# Patient Record
Sex: Female | Born: 1950 | Race: White | Hispanic: No | Marital: Single | Smoking: Never smoker
Health system: Southern US, Community
[De-identification: ages and names within clinical notes are randomized; demographics above are authoritative.]

## PROBLEM LIST (undated history)

## (undated) DIAGNOSIS — I499 Cardiac arrhythmia, unspecified: Secondary | ICD-10-CM

## (undated) DIAGNOSIS — R413 Other amnesia: Secondary | ICD-10-CM

## (undated) DIAGNOSIS — M41129 Adolescent idiopathic scoliosis, site unspecified: Secondary | ICD-10-CM

## (undated) DIAGNOSIS — K635 Polyp of colon: Secondary | ICD-10-CM

## (undated) DIAGNOSIS — I10 Essential (primary) hypertension: Secondary | ICD-10-CM

## (undated) DIAGNOSIS — I4891 Unspecified atrial fibrillation: Secondary | ICD-10-CM

## (undated) DIAGNOSIS — M778 Other enthesopathies, not elsewhere classified: Secondary | ICD-10-CM

## (undated) DIAGNOSIS — F419 Anxiety disorder, unspecified: Secondary | ICD-10-CM

## (undated) DIAGNOSIS — Z8619 Personal history of other infectious and parasitic diseases: Secondary | ICD-10-CM

## (undated) DIAGNOSIS — E785 Hyperlipidemia, unspecified: Secondary | ICD-10-CM

## (undated) DIAGNOSIS — M7580 Other shoulder lesions, unspecified shoulder: Secondary | ICD-10-CM

## (undated) DIAGNOSIS — I639 Cerebral infarction, unspecified: Secondary | ICD-10-CM

## (undated) HISTORY — PX: TONSILLECTOMY: SUR1361

## (undated) HISTORY — PX: BACK SURGERY: SHX140

---

## 2006-01-19 ENCOUNTER — Ambulatory Visit: Payer: Self-pay | Admitting: Internal Medicine

## 2009-12-13 ENCOUNTER — Inpatient Hospital Stay: Payer: Self-pay | Admitting: Internal Medicine

## 2009-12-29 ENCOUNTER — Encounter: Payer: Self-pay | Admitting: Internal Medicine

## 2010-01-12 ENCOUNTER — Encounter: Payer: Self-pay | Admitting: Internal Medicine

## 2010-02-12 ENCOUNTER — Encounter: Payer: Self-pay | Admitting: Internal Medicine

## 2010-03-14 ENCOUNTER — Encounter: Payer: Self-pay | Admitting: Internal Medicine

## 2010-04-14 ENCOUNTER — Encounter: Payer: Self-pay | Admitting: Internal Medicine

## 2010-04-24 ENCOUNTER — Emergency Department: Payer: Self-pay | Admitting: Unknown Physician Specialty

## 2010-05-14 DIAGNOSIS — I639 Cerebral infarction, unspecified: Secondary | ICD-10-CM

## 2010-05-14 HISTORY — DX: Cerebral infarction, unspecified: I63.9

## 2010-08-31 ENCOUNTER — Ambulatory Visit: Payer: Self-pay

## 2010-09-22 ENCOUNTER — Ambulatory Visit: Payer: Self-pay | Admitting: Neurology

## 2011-11-23 ENCOUNTER — Ambulatory Visit: Payer: Self-pay | Admitting: Internal Medicine

## 2012-11-26 ENCOUNTER — Ambulatory Visit: Payer: Self-pay | Admitting: Internal Medicine

## 2013-05-08 ENCOUNTER — Ambulatory Visit: Payer: Self-pay | Admitting: Gastroenterology

## 2013-05-10 LAB — PATHOLOGY REPORT

## 2013-11-27 ENCOUNTER — Ambulatory Visit: Payer: Self-pay | Admitting: Internal Medicine

## 2014-11-28 ENCOUNTER — Ambulatory Visit: Payer: Self-pay | Admitting: Internal Medicine

## 2014-12-03 ENCOUNTER — Ambulatory Visit: Payer: Self-pay | Admitting: Internal Medicine

## 2015-02-27 ENCOUNTER — Other Ambulatory Visit: Payer: Self-pay | Admitting: Internal Medicine

## 2015-02-27 DIAGNOSIS — N63 Unspecified lump in unspecified breast: Secondary | ICD-10-CM

## 2015-06-04 ENCOUNTER — Ambulatory Visit
Admission: RE | Admit: 2015-06-04 | Discharge: 2015-06-04 | Disposition: A | Discharge status: Home / Self Care | Payer: Medicare Other | Source: Ambulatory Visit | Attending: Internal Medicine | Admitting: Internal Medicine

## 2015-06-04 DIAGNOSIS — N63 Unspecified lump in unspecified breast: Secondary | ICD-10-CM

## 2015-10-28 ENCOUNTER — Other Ambulatory Visit: Payer: Self-pay | Admitting: Internal Medicine

## 2015-10-28 DIAGNOSIS — N63 Unspecified lump in unspecified breast: Secondary | ICD-10-CM

## 2015-12-10 ENCOUNTER — Other Ambulatory Visit: Payer: Self-pay | Admitting: Internal Medicine

## 2015-12-10 ENCOUNTER — Ambulatory Visit
Admission: RE | Admit: 2015-12-10 | Discharge: 2015-12-10 | Disposition: A | Discharge status: Home / Self Care | Payer: Medicare Other | Source: Ambulatory Visit | Attending: Internal Medicine | Admitting: Internal Medicine

## 2015-12-10 DIAGNOSIS — N63 Unspecified lump in unspecified breast: Secondary | ICD-10-CM

## 2015-12-11 ENCOUNTER — Other Ambulatory Visit: Payer: Self-pay | Admitting: Internal Medicine

## 2015-12-11 DIAGNOSIS — N63 Unspecified lump in unspecified breast: Secondary | ICD-10-CM

## 2015-12-22 ENCOUNTER — Ambulatory Visit
Admission: RE | Admit: 2015-12-22 | Discharge: 2015-12-22 | Disposition: A | Discharge status: Home / Self Care | Payer: Medicare Other | Source: Ambulatory Visit | Attending: Internal Medicine | Admitting: Internal Medicine

## 2015-12-22 ENCOUNTER — Other Ambulatory Visit: Payer: Self-pay | Admitting: Internal Medicine

## 2015-12-22 DIAGNOSIS — N63 Unspecified lump in unspecified breast: Secondary | ICD-10-CM

## 2016-09-02 ENCOUNTER — Encounter: Payer: Self-pay | Admitting: *Deleted

## 2016-09-05 ENCOUNTER — Encounter
Admission: RE | Disposition: A | Discharge status: Home / Self Care | Payer: Self-pay | Source: Ambulatory Visit | Attending: Gastroenterology

## 2016-09-05 ENCOUNTER — Ambulatory Visit: Payer: Medicare Other | Admitting: Anesthesiology

## 2016-09-05 ENCOUNTER — Encounter: Payer: Self-pay | Admitting: *Deleted

## 2016-09-05 ENCOUNTER — Ambulatory Visit
Admission: RE | Admit: 2016-09-05 | Discharge: 2016-09-05 | Disposition: A | Discharge status: Home / Self Care | Payer: Medicare Other | Source: Ambulatory Visit | Attending: Gastroenterology | Admitting: Gastroenterology

## 2016-09-05 DIAGNOSIS — I10 Essential (primary) hypertension: Secondary | ICD-10-CM | POA: Insufficient documentation

## 2016-09-05 DIAGNOSIS — Z800 Family history of malignant neoplasm of digestive organs: Secondary | ICD-10-CM | POA: Diagnosis not present

## 2016-09-05 DIAGNOSIS — K621 Rectal polyp: Secondary | ICD-10-CM | POA: Diagnosis not present

## 2016-09-05 DIAGNOSIS — K5730 Diverticulosis of large intestine without perforation or abscess without bleeding: Secondary | ICD-10-CM | POA: Diagnosis not present

## 2016-09-05 DIAGNOSIS — I4891 Unspecified atrial fibrillation: Secondary | ICD-10-CM | POA: Insufficient documentation

## 2016-09-05 DIAGNOSIS — Z8673 Personal history of transient ischemic attack (TIA), and cerebral infarction without residual deficits: Secondary | ICD-10-CM | POA: Insufficient documentation

## 2016-09-05 DIAGNOSIS — Z86010 Personal history of colonic polyps: Secondary | ICD-10-CM | POA: Diagnosis present

## 2016-09-05 DIAGNOSIS — E785 Hyperlipidemia, unspecified: Secondary | ICD-10-CM | POA: Diagnosis not present

## 2016-09-05 DIAGNOSIS — K635 Polyp of colon: Secondary | ICD-10-CM | POA: Insufficient documentation

## 2016-09-05 DIAGNOSIS — Z79899 Other long term (current) drug therapy: Secondary | ICD-10-CM | POA: Diagnosis not present

## 2016-09-05 DIAGNOSIS — Z1211 Encounter for screening for malignant neoplasm of colon: Secondary | ICD-10-CM | POA: Insufficient documentation

## 2016-09-05 DIAGNOSIS — K644 Residual hemorrhoidal skin tags: Secondary | ICD-10-CM | POA: Diagnosis not present

## 2016-09-05 DIAGNOSIS — D125 Benign neoplasm of sigmoid colon: Secondary | ICD-10-CM | POA: Insufficient documentation

## 2016-09-05 DIAGNOSIS — Z91040 Latex allergy status: Secondary | ICD-10-CM | POA: Insufficient documentation

## 2016-09-05 DIAGNOSIS — D122 Benign neoplasm of ascending colon: Secondary | ICD-10-CM | POA: Diagnosis not present

## 2016-09-05 DIAGNOSIS — Z7901 Long term (current) use of anticoagulants: Secondary | ICD-10-CM | POA: Insufficient documentation

## 2016-09-05 DIAGNOSIS — Z880 Allergy status to penicillin: Secondary | ICD-10-CM | POA: Insufficient documentation

## 2016-09-05 HISTORY — DX: Polyp of colon: K63.5

## 2016-09-05 HISTORY — DX: Unspecified atrial fibrillation: I48.91

## 2016-09-05 HISTORY — PX: COLONOSCOPY: SHX5424

## 2016-09-05 HISTORY — DX: Hyperlipidemia, unspecified: E78.5

## 2016-09-05 HISTORY — DX: Cardiac arrhythmia, unspecified: I49.9

## 2016-09-05 HISTORY — DX: Cerebral infarction, unspecified: I63.9

## 2016-09-05 HISTORY — DX: Adolescent idiopathic scoliosis, site unspecified: M41.129

## 2016-09-05 HISTORY — DX: Essential (primary) hypertension: I10

## 2016-09-05 LAB — PROTIME-INR
INR: 0.89
Prothrombin Time: 12 seconds (ref 11.4–15.2)

## 2016-09-05 SURGERY — COLONOSCOPY
Anesthesia: General

## 2016-09-05 MED ORDER — PROPOFOL 10 MG/ML IV BOLUS
INTRAVENOUS | Status: DC | PRN
  Administered 2016-09-05: 50 mg via INTRAVENOUS

## 2016-09-05 MED ORDER — SODIUM CHLORIDE 0.9 % IV SOLN
INTRAVENOUS | Status: DC
  Administered 2016-09-05: 13:00:00 via INTRAVENOUS
  Administered 2016-09-05: 1000 mL via INTRAVENOUS

## 2016-09-05 MED ORDER — SODIUM CHLORIDE 0.9 % IV SOLN: INTRAVENOUS | Status: DC

## 2016-09-05 MED ORDER — PROPOFOL 500 MG/50ML IV EMUL
INTRAVENOUS | Status: DC | PRN
  Administered 2016-09-05: 140 ug/kg/min via INTRAVENOUS

## 2016-09-05 MED ORDER — LIDOCAINE HCL (CARDIAC) 20 MG/ML IV SOLN
INTRAVENOUS | Status: DC | PRN
  Administered 2016-09-05: 60 mg via INTRAVENOUS

## 2016-09-05 NOTE — H&P (Signed)
Outpatient short stay form Pre-procedure 09/05/2016 12:53 PM Lollie Sails MD  Primary Physician: Dr. Emily Filbert  Reason for visit:  Colonoscopy  History of present illness:  Patient is a 65 year old female presenting today as above. She has personal history of colon polyps. Her last colonoscopy was 05/08/2013. There is a moderate size polyp removed from the rectosigmoid colon. There were several other polyps removed otherwise. These were all consistent with tubular adenomas. She tolerated her prep well. She takes no other blood thinning agent other than Coumadin. She has been off that for 5 days. ProTime is being checked.    Current Facility-Administered Medications:  .  0.9 %  sodium chloride infusion, , Intravenous, Continuous, Lollie Sails, MD .  0.9 %  sodium chloride infusion, , Intravenous, Continuous, Lollie Sails, MD  Prescriptions Prior to Admission  Medication Sig Dispense Refill Last Dose  . amLODipine-olmesartan (AZOR) 5-40 MG tablet Take 1 tablet by mouth daily.   09/04/2016 at Unknown time  . Calcium Carbonate-Vitamin D (CALCIUM-CARB 600 + D PO) Take 1 tablet by mouth daily.   09/04/2016 at Unknown time  . Potassium 99 MG TABS Take 1 tablet by mouth daily.   09/04/2016 at Unknown time  . simvastatin (ZOCOR) 10 MG tablet Take 10 mg by mouth daily.   09/04/2016 at Unknown time  . warfarin (COUMADIN) 2.5 MG tablet Take 2.5 mg by mouth daily. Friday,, Tuesday, Thursday, Saturday, and Sunday   Past Week at Unknown time  . warfarin (COUMADIN) 5 MG tablet Take 5 mg by mouth 2 (two) times a week. On Monday and Wednesday   Past Week at Unknown time     Allergies  Allergen Reactions  . Penicillins   . Latex Rash     Past Medical History:  Diagnosis Date  . Adolescent scoliosis   . Atrial fibrillation ([place])   . Dysrhythmia   . Hyperlipidemia   . Hypertension   . Stroke [facility])     Review of systems:      Physical Exam    Heart and lungs: Regular rate  and rhythm    HEENT: Normocephalic atraumatic eyes are anicteric     Other:     Pertinant exam for procedure: Soft nontender nondistended bowel sounds positive normoactive.    Planned proceedures: Colonoscopy and indicated procedures. I have discussed the risks benefits and complications of procedures to include not limited to bleeding, infection, perforation and the risk of sedation and the patient wishes to proceed.    Lollie Sails, MD Gastroenterology 09/05/2016  12:53 PM

## 2016-09-05 NOTE — Transfer of Care (Signed)
Immediate Anesthesia Transfer of Care Note  Patient: Shannon Donovan  Procedure(s) Performed: Procedure(s) with comments: COLONOSCOPY (N/A) - Pt on Coumadin (PT/ INR)  Patient Location: Endoscopy Unit  Anesthesia Type:General  Level of Consciousness: awake, alert , oriented and patient cooperative  Airway & Oxygen Therapy: Patient Spontanous Breathing and Patient connected to nasal cannula oxygen  Post-op Assessment: Report given to RN, Post -op Vital signs reviewed and stable and Patient moving all extremities X 4  Post vital signs: Reviewed and stable  Last Vitals:  Vitals:   09/05/16 1221 09/05/16 1404  BP: (!) 146/66   Pulse: (!) 106   Resp: 18   Temp: 36.9 C (!) (P) 35.8 C    Last Pain:  Vitals:   09/05/16 1404  TempSrc: (P) Axillary         Complications: No apparent anesthesia complications

## 2016-09-05 NOTE — Anesthesia Preprocedure Evaluation (Signed)
Anesthesia Evaluation  Patient identified by MRN, date of birth, ID band Patient awake    Reviewed: Allergy & Precautions, NPO status , Patient's Chart, lab work & pertinent test results  History of Anesthesia Complications Negative for: history of anesthetic complications  Airway Mallampati: II  TM Distance: >3 FB Neck ROM: Full    Dental no notable dental hx.    Pulmonary neg pulmonary ROS, neg sleep apnea, neg COPD,    breath sounds clear to auscultation- rhonchi (-) wheezing      Cardiovascular Exercise Tolerance: Good hypertension, Pt. on medications (-) CAD and (-) Past MI  Rhythm:Regular Rate:Normal - Systolic murmurs and - Diastolic murmurs    Neuro/Psych CVA, No Residual Symptoms negative psych ROS   GI/Hepatic negative GI ROS, Neg liver ROS,   Endo/Other  negative endocrine ROSneg diabetes  Renal/GU negative Renal ROS     Musculoskeletal negative musculoskeletal ROS (+)   Abdominal (+) - obese,   Peds  Hematology negative hematology ROS (+)   Anesthesia Other Findings Past Medical History: No date: Adolescent scoliosis No date: Atrial fibrillation (HCC) No date: Dysrhythmia No date: Hyperlipidemia No date: Hypertension No date: Stroke [facility])   Reproductive/Obstetrics                             Anesthesia Physical Anesthesia Plan  ASA: II  Anesthesia Plan: General   Post-op Pain Management:    Induction: Intravenous  Airway Management Planned: Natural Airway  Additional Equipment:   Intra-op Plan:   Post-operative Plan:   Informed Consent: I have reviewed the patients History and Physical, chart, labs and discussed the procedure including the risks, benefits and alternatives for the proposed anesthesia with the patient or authorized representative who has indicated his/her understanding and acceptance.   Dental advisory given  Plan Discussed with: CRNA and  Anesthesiologist  Anesthesia Plan Comments:         Anesthesia Quick Evaluation

## 2016-09-05 NOTE — Anesthesia Postprocedure Evaluation (Signed)
Anesthesia Post Note  Patient: Shannon Donovan  Procedure(s) Performed: Procedure(s) (LRB): COLONOSCOPY (N/A)  Patient location during evaluation: Endoscopy Anesthesia Type: General Level of consciousness: awake and alert and oriented Pain management: pain level controlled Vital Signs Assessment: post-procedure vital signs reviewed and stable Respiratory status: spontaneous breathing, nonlabored ventilation and respiratory function stable Cardiovascular status: blood pressure returned to baseline and stable Postop Assessment: no signs of nausea or vomiting Anesthetic complications: no    Last Vitals:  Vitals:   09/05/16 1425 09/05/16 1434  BP: 106/85 137/62  Pulse: 73 66  Resp: (!) 26 18  Temp:      Last Pain:  Vitals:   09/05/16 1404  TempSrc: Axillary                 Shannon Donovan

## 2016-09-05 NOTE — Op Note (Addendum)
[facility] Gastroenterology Patient Name: Shannon Donovan Procedure Date: 09/05/2016 1:12 PM MRN: [id number] Account #: [id number] Date of Birth: 09/29/1951 Admit Type: Outpatient Age: 65 Room: [facility] ENDO ROOM 3 Gender: Female Note Status: Finalized Procedure:            Colonoscopy Indications:          High risk colon cancer surveillance: Personal history                        of colonic polyps, Family History colon cancer Providers:            Lollie Sails, MD Referring MD:         Rusty Aus, MD (Referring MD) Medicines:            Monitored Anesthesia Care Complications:        No immediate complications. Procedure:            Pre-Anesthesia Assessment:                       - ASA Grade Assessment: II - A patient with mild                        systemic disease.                       After obtaining informed consent, the colonoscope was                        passed under direct vision. Throughout the procedure,                        the patient's blood pressure, pulse, and oxygen                        saturations were monitored continuously. The                        Colonoscope was introduced through the anus and                        advanced to the the cecum, identified by appendiceal                        orifice and ileocecal valve. The colonoscopy was                        unusually difficult due to restricted mobility of the                        colon, significant looping and a tortuous colon.                        Successful completion of the procedure was aided by                        changing the patient to a supine position, changing the                        patient to a prone position, using manual pressure and  withdrawing and reinserting the scope. The patient                        tolerated the procedure well. The quality of the bowel                        preparation was good. Findings:      A 1  mm polyp was found in the sigmoid colon. The polyp was sessile. The       polyp was removed with a cold biopsy forceps. Resection and retrieval       were complete.      A 8 mm polyp was found in the distal ascending colon. The polyp was       flat. The polyp was removed with a cold snare. Resection and retrieval       were complete.      A less than 1 mm polyp was found in the cecum. The polyp was sessile.       The polyp was removed with a cold biopsy forceps. Resection and       retrieval were complete.      A 1 mm polyp was found in the rectum. The polyp was sessile. The polyp       was removed with a cold biopsy forceps. Resection and retrieval were       complete.      Papillaform lesion/possible wart noted on a perianal extension of a       labial fold.      The digital rectal exam findings include perianal skin tags.      The digital rectal exam was normal otherwise.      Multiple small and large-mouthed diverticula were found in the sigmoid       colon, descending colon, transverse colon and ascending colon. Impression:           - One 1 mm polyp in the sigmoid colon, removed with a                        cold biopsy forceps. Resected and retrieved.                       - One 8 mm polyp in the distal ascending colon, removed                        with a cold snare. Resected and retrieved.                       - One less than 1 mm polyp in the cecum, removed with a                        cold biopsy forceps. Resected and retrieved.                       - One 1 mm polyp in the rectum, removed with a cold                        biopsy forceps. Resected and retrieved.                       - Perianal skin tags. found on digital rectal exam.                       -  Diverticulosis in the sigmoid colon, in the                        descending colon, in the transverse colon and in the                        ascending colon. Recommendation:       - Discharge patient to home.                        - Await pathology results.                       - Refer to a gynecologist at appointment to be                        scheduled. Procedure Code(s):    --- Professional ---                       [phone], Colonoscopy, flexible; with removal of tumor(s),                        polyp(s), or other lesion(s) by snare technique                       [id number], 29, Colonoscopy, flexible; with biopsy, single                        or multiple Diagnosis Code(s):    --- Professional ---                       [id number], Personal history of other malignant neoplasm                        of large intestine                       D12.5, Benign neoplasm of sigmoid colon                       D12.2, Benign neoplasm of ascending colon                       D12.0, Benign neoplasm of cecum                       K62.1, Rectal polyp                       Z86.010, Personal history of colonic polyps                       K57.30, Diverticulosis of large intestine without                        perforation or abscess without bleeding CPT copyright 2016 American Medical Association. All rights reserved. The codes documented in this report are preliminary and upon coder review may  be revised to meet current compliance requirements. Lollie Sails, MD 09/05/2016 2:07:07 PM This report has been signed electronically. Number of Addenda: 0 Note Initiated On: 09/05/2016 1:12 PM Scope Withdrawal Time: 0 hours 10 minutes 23 seconds  Total Procedure Duration:  0 hours 41 minutes 43 seconds       [facility]

## 2016-09-06 ENCOUNTER — Encounter: Payer: Self-pay | Admitting: Gastroenterology

## 2016-09-07 LAB — SURGICAL PATHOLOGY

## 2016-11-09 ENCOUNTER — Other Ambulatory Visit: Payer: Self-pay | Admitting: Internal Medicine

## 2016-11-09 DIAGNOSIS — Z1231 Encounter for screening mammogram for malignant neoplasm of breast: Secondary | ICD-10-CM

## 2016-12-22 ENCOUNTER — Ambulatory Visit
Admission: RE | Admit: 2016-12-22 | Discharge: 2016-12-22 | Disposition: A | Discharge status: Home / Self Care | Payer: Medicare Other | Source: Ambulatory Visit | Attending: Internal Medicine | Admitting: Internal Medicine

## 2016-12-22 DIAGNOSIS — Z1231 Encounter for screening mammogram for malignant neoplasm of breast: Secondary | ICD-10-CM | POA: Diagnosis not present

## 2017-04-30 ENCOUNTER — Emergency Department: Payer: Medicare Other

## 2017-04-30 ENCOUNTER — Emergency Department
Admission: EM | Admit: 2017-04-30 | Discharge: 2017-04-30 | Disposition: A | Discharge status: Home / Self Care | Payer: Medicare Other | Attending: Emergency Medicine | Admitting: Emergency Medicine

## 2017-04-30 DIAGNOSIS — Z79899 Other long term (current) drug therapy: Secondary | ICD-10-CM | POA: Insufficient documentation

## 2017-04-30 DIAGNOSIS — Z91040 Latex allergy status: Secondary | ICD-10-CM | POA: Diagnosis not present

## 2017-04-30 DIAGNOSIS — I10 Essential (primary) hypertension: Secondary | ICD-10-CM | POA: Diagnosis not present

## 2017-04-30 DIAGNOSIS — M25462 Effusion, left knee: Secondary | ICD-10-CM | POA: Diagnosis not present

## 2017-04-30 DIAGNOSIS — Z7901 Long term (current) use of anticoagulants: Secondary | ICD-10-CM | POA: Diagnosis not present

## 2017-04-30 DIAGNOSIS — M25562 Pain in left knee: Secondary | ICD-10-CM | POA: Diagnosis present

## 2017-04-30 MED ORDER — TRAMADOL HCL 50 MG PO TABS
50.0000 mg | ORAL_TABLET | Freq: Once | ORAL | Status: AC
  Administered 2017-04-30: 50 mg via ORAL
  Filled 2017-04-30: qty 1

## 2017-04-30 MED ORDER — TRAMADOL HCL 50 MG PO TABS: 50.0000 mg | ORAL_TABLET | Freq: Four times a day (QID) | ORAL | 0 refills | Status: DC | PRN

## 2017-04-30 NOTE — ED Notes (Signed)
See triage note  States she noticed some swelling to left knee 2 days ago no pain with rest  But does have discomfort with standing  Denies any injury

## 2017-04-30 NOTE — ED Provider Notes (Signed)
[facility] Emergency Department Provider Note ____________________________________________  Time seen: Approximately 8:21 AM  I have reviewed the triage vital signs and the nursing notes.   HISTORY  Chief Complaint Knee Pain    HPI Shannon Donovan is a 66 y.o. female who presents to the emergency department for evaluation of left knee pain and swelling. No known injury. Pain nearly resolves while at rest, but increases with any weight bearing. No previous knee injuries. She has taken tylenol with little to no relief.  Past Medical History:  Diagnosis Date  . Adolescent scoliosis   . Atrial fibrillation ([place])   . Dysrhythmia   . Hyperlipidemia   . Hypertension   . Stroke [facility])     There are no active problems to display for this patient.   Past Surgical History:  Procedure Laterality Date  . BACK SURGERY    . COLONOSCOPY N/A 09/05/2016   Procedure: COLONOSCOPY;  Surgeon: Lollie Sails, MD;  Location: [facility] ENDOSCOPY;  Service: Endoscopy;  Laterality: N/A;  Pt on Coumadin (PT/ INR)  . TONSILLECTOMY      Prior to Admission medications   Medication Sig Start Date End Date Taking? Authorizing Provider  amLODipine-olmesartan (AZOR) 5-40 MG tablet Take 1 tablet by mouth daily.    [provider]  Calcium Carbonate-Vitamin D (CALCIUM-CARB 600 + D PO) Take 1 tablet by mouth daily.    [provider]  Potassium 99 MG TABS Take 1 tablet by mouth daily.    [provider]  simvastatin (ZOCOR) 10 MG tablet Take 10 mg by mouth daily.    [provider]  traMADol (ULTRAM) 50 MG tablet Take 1 tablet (50 mg total) by mouth every 6 (six) hours as needed. 04/30/17   Trig Mcbryar, Johnette Abraham B, FNP  warfarin (COUMADIN) 2.5 MG tablet Take 2.5 mg by mouth daily. Friday,, Tuesday, Thursday, Saturday, and Sunday    [provider]  warfarin (COUMADIN) 5 MG tablet Take 5 mg by mouth 2 (two) times a week. On Monday and Wednesday     [provider]    Allergies Penicillins and Latex  Family History  Problem Relation Age of Onset  . Breast cancer Neg Hx     Social History Social History  Substance Use Topics  . Smoking status: Never Smoker  . Smokeless tobacco: Never Used  . Alcohol use No    Review of Systems Constitutional: Negative for fever. Cardiovascular: Negative for chest pain  Respiratory: Negative for shortness of breath. Musculoskeletal: Negative for injury. Skin: Negative for rash, lesion, or wound.  ____________________________________________   PHYSICAL EXAM:  VITAL SIGNS: ED Triage Vitals  Enc Vitals Group     BP 04/30/17 0816 (!) 152/82     Pulse Rate 04/30/17 0816 82     Resp 04/30/17 0816 16     Temp 04/30/17 0816 97.8 F (36.6 C)     Temp Source 04/30/17 0816 Oral     SpO2 04/30/17 0816 100 %     Weight 04/30/17 0814 115 lb (52.2 kg)     Height 04/30/17 0814 4\' 11"  (1.499 m)     Head Circumference --      [place] Flow --      Pain Score 04/30/17 0814 1     Pain Loc --      Pain Edu? --      Excl. in [place]? --     Constitutional: Alert and oriented. Well appearing and in no acute distress. Eyes:  Negative for conjunctival erythema or drainage.  Head: Atraumatic. Neck: AFROM Respiratory: Respirations are even and unlabored. Musculoskeletal:  Left knee joint effusion. AFROM with pain in flexion past 90*. DP and PT pulse is 2+.  Neurologic: Motor function and sensation is intact  Skin: Atraumatic  ____________________________________________   LABS (all labs ordered are listed, but only abnormal results are displayed)  Labs Reviewed - No data to display ____________________________________________  RADIOLOGY  Left knee image:  IMPRESSION: No acute bony abnormality.  Lateral view is suggestive of small joint effusion and/or soft tissue swelling.  Chondrocalcinosis, with early osteoarthritis.   Electronically Signed By: Corrie Mckusick D.O. On:  04/30/2017 08:58 ____________________________________________   PROCEDURES  Procedure(s) performed: Knee immobilizer applied by ER tech. Patient neurovascularly intact post application.  ____________________________________________   INITIAL IMPRESSION / ASSESSMENT AND PLAN / ED COURSE  Shannon Donovan is a 66 y.o. female who presents to the emergency department for evaluation of left knee pain without known cause. Image shows small joint effusion. She is on coumadin, therefore arthrocentesis will not be performed at this time. She was advised to wear the knee immobilizer and follow up with orthopedics if symptoms are not resolving over the next few days. She will be given Tramadol to use as needed for pain. She was advised to return to the ER for symptoms that change or worsen if unable to schedule an appointment.   Pertinent labs & imaging results that were available during my care of the patient were reviewed by me and considered in my medical decision making (see chart for details).  _________________________________________   FINAL CLINICAL IMPRESSION(S) / ED DIAGNOSES  Final diagnoses:  Effusion of left knee    Discharge Medication List as of 04/30/2017  9:07 AM    START taking these medications   Details  traMADol (ULTRAM) 50 MG tablet Take 1 tablet (50 mg total) by mouth every 6 (six) hours as needed., Starting Sun 04/30/2017, Print        If controlled substance prescribed during this visit, 12 month history viewed on the [place] prior to issuing an initial prescription for Schedule II or III opiod.    Victorino Dike, FNP 04/30/17 0945    Nance Pear, MD 04/30/17 1226

## 2017-04-30 NOTE — Discharge Instructions (Signed)
Wear your knee immobilizer when out of bed or walking. Schedule a follow up appointment with orthopedics. Return to the ER for symptoms that change or worsen if unable to schedule an appointment.

## 2017-04-30 NOTE — ED Triage Notes (Signed)
Pt c/o pain and swelling of the left knee for the past couple of days, denies injury.Marland Kitchen

## 2017-11-15 ENCOUNTER — Other Ambulatory Visit: Payer: Self-pay | Admitting: Internal Medicine

## 2017-11-15 DIAGNOSIS — Z1231 Encounter for screening mammogram for malignant neoplasm of breast: Secondary | ICD-10-CM

## 2017-12-25 ENCOUNTER — Ambulatory Visit
Admission: RE | Admit: 2017-12-25 | Discharge: 2017-12-25 | Disposition: A | Discharge status: Home / Self Care | Payer: Medicare Other | Source: Ambulatory Visit | Attending: Internal Medicine | Admitting: Internal Medicine

## 2017-12-25 DIAGNOSIS — Z1231 Encounter for screening mammogram for malignant neoplasm of breast: Secondary | ICD-10-CM | POA: Diagnosis not present

## 2017-12-26 ENCOUNTER — Other Ambulatory Visit: Payer: Self-pay | Admitting: Surgery

## 2017-12-26 DIAGNOSIS — M75122 Complete rotator cuff tear or rupture of left shoulder, not specified as traumatic: Secondary | ICD-10-CM

## 2017-12-26 DIAGNOSIS — M25512 Pain in left shoulder: Principal | ICD-10-CM

## 2017-12-26 DIAGNOSIS — G8929 Other chronic pain: Secondary | ICD-10-CM

## 2017-12-26 DIAGNOSIS — M7582 Other shoulder lesions, left shoulder: Secondary | ICD-10-CM

## 2018-01-04 ENCOUNTER — Ambulatory Visit
Admission: RE | Admit: 2018-01-04 | Discharge: 2018-01-04 | Disposition: A | Discharge status: Home / Self Care | Payer: Medicare Other | Source: Ambulatory Visit | Attending: Surgery | Admitting: Surgery

## 2018-01-04 DIAGNOSIS — M75102 Unspecified rotator cuff tear or rupture of left shoulder, not specified as traumatic: Secondary | ICD-10-CM | POA: Insufficient documentation

## 2018-01-04 DIAGNOSIS — M7582 Other shoulder lesions, left shoulder: Secondary | ICD-10-CM

## 2018-01-04 DIAGNOSIS — G8929 Other chronic pain: Secondary | ICD-10-CM | POA: Diagnosis not present

## 2018-01-04 DIAGNOSIS — M7552 Bursitis of left shoulder: Secondary | ICD-10-CM | POA: Diagnosis not present

## 2018-01-04 DIAGNOSIS — M75122 Complete rotator cuff tear or rupture of left shoulder, not specified as traumatic: Secondary | ICD-10-CM | POA: Insufficient documentation

## 2018-01-04 DIAGNOSIS — M25512 Pain in left shoulder: Secondary | ICD-10-CM | POA: Diagnosis present

## 2018-02-01 ENCOUNTER — Other Ambulatory Visit: Payer: No Typology Code available for payment source

## 2018-02-05 ENCOUNTER — Encounter
Admission: RE | Admit: 2018-02-05 | Discharge: 2018-02-05 | Disposition: A | Discharge status: Home / Self Care | Payer: Medicare Other | Source: Ambulatory Visit | Attending: Surgery | Admitting: Surgery

## 2018-02-05 ENCOUNTER — Other Ambulatory Visit: Payer: Self-pay

## 2018-02-05 DIAGNOSIS — Z01812 Encounter for preprocedural laboratory examination: Secondary | ICD-10-CM | POA: Insufficient documentation

## 2018-02-05 DIAGNOSIS — Z01810 Encounter for preprocedural cardiovascular examination: Secondary | ICD-10-CM | POA: Insufficient documentation

## 2018-02-05 HISTORY — DX: Other amnesia: R41.3

## 2018-02-05 LAB — CBC
HEMATOCRIT: 45.2 % (ref 35.0–47.0)
HEMOGLOBIN: 15.1 g/dL (ref 12.0–16.0)
MCH: 29.5 pg (ref 26.0–34.0)
MCHC: 33.3 g/dL (ref 32.0–36.0)
MCV: 88.3 fL (ref 80.0–100.0)
Platelets: 251 10*3/uL (ref 150–440)
RBC: 5.12 MIL/uL (ref 3.80–5.20)
RDW: 14.4 % (ref 11.5–14.5)
WBC: 6.5 10*3/uL (ref 3.6–11.0)

## 2018-02-05 NOTE — Patient Instructions (Signed)
Your procedure is scheduled on: Thursday 02/08/18 Report to [facility]. To find out your arrival time please call [phone] between 1PM - 3PM on Wednesday 02/07/18.  Remember: Instructions that are not followed completely may result in serious medical risk, up to and including death, or upon the discretion of your surgeon and anesthesiologist your surgery may need to be rescheduled.     _X__ 1. Do not eat food after midnight the night before your procedure.                 No gum chewing or hard candies. You may drink clear liquids up to 2 hours                 before you are scheduled to arrive for your surgery- DO not drink clear                 liquids within 2 hours of the start of your surgery.                 Clear Liquids include:  water, apple juice without pulp, clear carbohydrate                 drink such as Clearfast or Gatorade, Black Coffee or Tea (Do not add                 anything to coffee or tea).  __X__2.  On the morning of surgery brush your teeth with toothpaste and water, you                 may rinse your mouth with mouthwash if you wish.  Do not swallow any              toothpaste of mouthwash.     _X__ 3.  No Alcohol for 24 hours before or after surgery.   _X__ 4.  Do Not Smoke or use e-cigarettes For 24 Hours Prior to Your Surgery.                 Do not use any chewable tobacco products for at least 6 hours prior to                 surgery.  ____  5.  Bring all medications with you on the day of surgery if instructed.   __X__  6.  Notify your doctor if there is any change in your medical condition      (cold, fever, infections).     Do not wear jewelry, make-up, hairpins, clips or nail polish. Do not wear lotions, powders, or perfumes.  Do not shave 48 hours prior to surgery. Men may shave face and neck. Do not bring valuables to the hospital.    [facility] is not responsible for any belongings or  valuables.  Contacts, dentures/partials or body piercings may not be worn into surgery. Bring a case for your contacts, glasses or hearing aids, a denture cup will be supplied. Leave your suitcase in the car. After surgery it may be brought to your room. For patients admitted to the hospital, discharge time is determined by your treatment team.   Patients discharged the day of surgery will not be allowed to drive home.   Please read over the following fact sheets that you were given:   MRSA Information  __X__ Take these medicines the morning of surgery with A SIP OF WATER:  1. NONE  2.   3.   4.  5.  6.  ____ Fleet Enema (as directed)   __X__ Use CHG Soap/SAGE wipes as directed  ____ Use inhalers on the day of surgery  ____ Stop metformin/Janumet/Farxiga 2 days prior to surgery    ____ Take 1/2 of usual insulin dose the night before surgery. No insulin the morning          of surgery.   __X__ Stop Blood Thinners Coumadin/Plavix/Xarelto/Pleta/Pradaxa/Eliquis/Effient/Aspirin  on   Or contact your Surgeon, Cardiologist or Medical Doctor regarding  ability to stop your blood thinners  (PATIENT HAS ALREADY STOPPED)  __X__ Stop Anti-inflammatories 7 days before surgery such as Advil, Ibuprofen, Motrin,  BC or Goodies Powder, Naprosyn, Naproxen, Aleve, Aspirin MAY USE TYLENOL   __X__ Stop all herbal supplements, fish oil or vitamin E until after surgery.  CALCIUM, B12, D3, POTASSIUM OK TO CONTINUE  ____ Bring C-Pap to the hospital.

## 2018-02-07 MED ORDER — CLINDAMYCIN PHOSPHATE 900 MG/50ML IV SOLN
900.0000 mg | Freq: Once | INTRAVENOUS | Status: AC
  Administered 2018-02-08: 900 mg via INTRAVENOUS

## 2018-02-08 ENCOUNTER — Encounter: Payer: Self-pay | Admitting: *Deleted

## 2018-02-08 ENCOUNTER — Observation Stay
Admission: RE | Admit: 2018-02-08 | Discharge: 2018-02-09 | Disposition: A | Discharge status: Home Health | Payer: Medicare Other | Source: Ambulatory Visit | Attending: Surgery | Admitting: Surgery

## 2018-02-08 ENCOUNTER — Ambulatory Visit: Payer: Medicare Other | Admitting: Anesthesiology

## 2018-02-08 ENCOUNTER — Other Ambulatory Visit: Payer: Self-pay

## 2018-02-08 ENCOUNTER — Encounter
Admission: RE | Disposition: A | Discharge status: Home Health | Payer: Self-pay | Source: Ambulatory Visit | Attending: Surgery

## 2018-02-08 DIAGNOSIS — I4891 Unspecified atrial fibrillation: Secondary | ICD-10-CM | POA: Diagnosis not present

## 2018-02-08 DIAGNOSIS — M75122 Complete rotator cuff tear or rupture of left shoulder, not specified as traumatic: Secondary | ICD-10-CM | POA: Diagnosis not present

## 2018-02-08 DIAGNOSIS — I10 Essential (primary) hypertension: Secondary | ICD-10-CM | POA: Diagnosis not present

## 2018-02-08 DIAGNOSIS — M7522 Bicipital tendinitis, left shoulder: Secondary | ICD-10-CM | POA: Diagnosis not present

## 2018-02-08 DIAGNOSIS — M75112 Incomplete rotator cuff tear or rupture of left shoulder, not specified as traumatic: Secondary | ICD-10-CM | POA: Diagnosis present

## 2018-02-08 DIAGNOSIS — Z8673 Personal history of transient ischemic attack (TIA), and cerebral infarction without residual deficits: Secondary | ICD-10-CM | POA: Insufficient documentation

## 2018-02-08 DIAGNOSIS — Z880 Allergy status to penicillin: Secondary | ICD-10-CM | POA: Insufficient documentation

## 2018-02-08 DIAGNOSIS — M7542 Impingement syndrome of left shoulder: Secondary | ICD-10-CM | POA: Diagnosis not present

## 2018-02-08 DIAGNOSIS — E785 Hyperlipidemia, unspecified: Secondary | ICD-10-CM | POA: Diagnosis not present

## 2018-02-08 DIAGNOSIS — M75102 Unspecified rotator cuff tear or rupture of left shoulder, not specified as traumatic: Secondary | ICD-10-CM | POA: Diagnosis present

## 2018-02-08 DIAGNOSIS — Z7901 Long term (current) use of anticoagulants: Secondary | ICD-10-CM | POA: Diagnosis not present

## 2018-02-08 DIAGNOSIS — Z79899 Other long term (current) drug therapy: Secondary | ICD-10-CM | POA: Insufficient documentation

## 2018-02-08 HISTORY — PX: SHOULDER ARTHROSCOPY WITH OPEN ROTATOR CUFF REPAIR: SHX6092

## 2018-02-08 SURGERY — ARTHROSCOPY, SHOULDER WITH REPAIR, ROTATOR CUFF, OPEN
Anesthesia: General | Site: Shoulder | Laterality: Left | Wound class: Clean

## 2018-02-08 MED ORDER — FLEET ENEMA 7-19 GM/118ML RE ENEM: 1.0000 | ENEMA | Freq: Once | RECTAL | Status: DC | PRN

## 2018-02-08 MED ORDER — METOCLOPRAMIDE HCL 5 MG/ML IJ SOLN: 5.0000 mg | Freq: Three times a day (TID) | INTRAMUSCULAR | Status: DC | PRN

## 2018-02-08 MED ORDER — GLYCOPYRROLATE 0.2 MG/ML IJ SOLN
INTRAMUSCULAR | Status: DC | PRN
  Administered 2018-02-08: 0.2 mg via INTRAVENOUS

## 2018-02-08 MED ORDER — FENTANYL CITRATE (PF) 100 MCG/2ML IJ SOLN
50.0000 ug | Freq: Once | INTRAMUSCULAR | Status: AC
  Administered 2018-02-08: 50 ug via INTRAVENOUS

## 2018-02-08 MED ORDER — BUPIVACAINE LIPOSOME 1.3 % IJ SUSP
INTRAMUSCULAR | Status: DC | PRN
  Administered 2018-02-08: 20 mL via PERINEURAL

## 2018-02-08 MED ORDER — BUPIVACAINE LIPOSOME 1.3 % IJ SUSP
INTRAMUSCULAR | Status: AC
  Filled 2018-02-08: qty 20

## 2018-02-08 MED ORDER — WARFARIN SODIUM 2.5 MG PO TABS
2.5000 mg | ORAL_TABLET | ORAL | Status: DC
  Filled 2018-02-08: qty 1

## 2018-02-08 MED ORDER — MAGNESIUM HYDROXIDE 400 MG/5ML PO SUSP: 30.0000 mL | Freq: Every day | ORAL | Status: DC | PRN

## 2018-02-08 MED ORDER — EPINEPHRINE PF 1 MG/ML IJ SOLN
INTRAMUSCULAR | Status: AC
  Filled 2018-02-08: qty 2

## 2018-02-08 MED ORDER — LIDOCAINE HCL (PF) 2 % IJ SOLN
INTRAMUSCULAR | Status: AC
  Filled 2018-02-08: qty 10

## 2018-02-08 MED ORDER — BUPIVACAINE HCL (PF) 0.5 % IJ SOLN
INTRAMUSCULAR | Status: AC
  Filled 2018-02-08: qty 10

## 2018-02-08 MED ORDER — ONDANSETRON HCL 4 MG/2ML IJ SOLN
INTRAMUSCULAR | Status: AC
  Filled 2018-02-08: qty 2

## 2018-02-08 MED ORDER — MIDAZOLAM HCL 2 MG/2ML IJ SOLN
1.0000 mg | Freq: Once | INTRAMUSCULAR | Status: AC
  Administered 2018-02-08: 1 mg via INTRAVENOUS

## 2018-02-08 MED ORDER — ONDANSETRON HCL 4 MG/2ML IJ SOLN: 4.0000 mg | Freq: Four times a day (QID) | INTRAMUSCULAR | Status: DC | PRN

## 2018-02-08 MED ORDER — ACETAMINOPHEN 500 MG PO TABS
1000.0000 mg | ORAL_TABLET | Freq: Four times a day (QID) | ORAL | Status: DC
  Administered 2018-02-08 – 2018-02-09 (×3): 1000 mg via ORAL
  Filled 2018-02-08 (×3): qty 2

## 2018-02-08 MED ORDER — LIDOCAINE HCL (PF) 1 % IJ SOLN
INTRAMUSCULAR | Status: AC
Start: 2018-02-08 — End: 2018-02-09
  Filled 2018-02-08: qty 5

## 2018-02-08 MED ORDER — WARFARIN SODIUM 5 MG PO TABS: 5.0000 mg | ORAL_TABLET | ORAL | Status: DC

## 2018-02-08 MED ORDER — POTASSIUM GLUCONATE 550 (90 K) MG PO TABS: 550.0000 mg | ORAL_TABLET | Freq: Every evening | ORAL | Status: DC

## 2018-02-08 MED ORDER — ACETAMINOPHEN 325 MG PO TABS: 325.0000 mg | ORAL_TABLET | Freq: Four times a day (QID) | ORAL | Status: DC | PRN

## 2018-02-08 MED ORDER — AMLODIPINE-OLMESARTAN 5-40 MG PO TABS: 1.0000 | ORAL_TABLET | Freq: Every evening | ORAL | Status: DC

## 2018-02-08 MED ORDER — LIDOCAINE HCL (CARDIAC) 20 MG/ML IV SOLN
INTRAVENOUS | Status: DC | PRN
Start: 2018-02-08 — End: 2018-02-08
  Administered 2018-02-08: 50 mg via INTRAVENOUS

## 2018-02-08 MED ORDER — SUGAMMADEX SODIUM 200 MG/2ML IV SOLN
INTRAVENOUS | Status: DC | PRN
  Administered 2018-02-08: 150 mg via INTRAVENOUS

## 2018-02-08 MED ORDER — BISACODYL 10 MG RE SUPP: 10.0000 mg | Freq: Every day | RECTAL | Status: DC | PRN

## 2018-02-08 MED ORDER — DOCUSATE SODIUM 100 MG PO CAPS
100.0000 mg | ORAL_CAPSULE | Freq: Two times a day (BID) | ORAL | Status: DC
  Administered 2018-02-08 – 2018-02-09 (×2): 100 mg via ORAL
  Filled 2018-02-08 (×2): qty 1

## 2018-02-08 MED ORDER — BUPIVACAINE-EPINEPHRINE (PF) 0.5% -1:200000 IJ SOLN
INTRAMUSCULAR | Status: AC
  Filled 2018-02-08: qty 30

## 2018-02-08 MED ORDER — DEXAMETHASONE SODIUM PHOSPHATE 10 MG/ML IJ SOLN
INTRAMUSCULAR | Status: AC
  Filled 2018-02-08: qty 1

## 2018-02-08 MED ORDER — VITAMIN D 1000 UNITS PO TABS: 1000.0000 [IU] | ORAL_TABLET | Freq: Every evening | ORAL | Status: DC

## 2018-02-08 MED ORDER — LACTATED RINGERS IV SOLN
INTRAVENOUS | Status: DC | PRN
  Administered 2018-02-08: 14:00:00
  Administered 2018-02-08: 1 mL

## 2018-02-08 MED ORDER — BUPIVACAINE HCL (PF) 0.5 % IJ SOLN
INTRAMUSCULAR | Status: DC | PRN
  Administered 2018-02-08: 10 mL via PERINEURAL

## 2018-02-08 MED ORDER — ROCURONIUM BROMIDE 100 MG/10ML IV SOLN
INTRAVENOUS | Status: DC | PRN
  Administered 2018-02-08: 40 mg via INTRAVENOUS

## 2018-02-08 MED ORDER — PROPOFOL 10 MG/ML IV BOLUS
INTRAVENOUS | Status: DC | PRN
  Administered 2018-02-08: 140 mg via INTRAVENOUS

## 2018-02-08 MED ORDER — AMLODIPINE BESYLATE 5 MG PO TABS
5.0000 mg | ORAL_TABLET | Freq: Every day | ORAL | Status: DC
  Administered 2018-02-09: 5 mg via ORAL
  Filled 2018-02-08: qty 1

## 2018-02-08 MED ORDER — OXYCODONE HCL 5 MG PO TABS: 5.0000 mg | ORAL_TABLET | ORAL | Status: DC | PRN

## 2018-02-08 MED ORDER — FAMOTIDINE 20 MG PO TABS
20.0000 mg | ORAL_TABLET | Freq: Once | ORAL | Status: AC
  Administered 2018-02-08: 20 mg via ORAL

## 2018-02-08 MED ORDER — WARFARIN - PHARMACIST DOSING INPATIENT
Freq: Every day | Status: DC
Start: 2018-02-08 — End: 2018-02-09

## 2018-02-08 MED ORDER — FENTANYL CITRATE (PF) 100 MCG/2ML IJ SOLN
INTRAMUSCULAR | Status: DC | PRN
  Administered 2018-02-08: 25 ug via INTRAVENOUS
  Administered 2018-02-08: 50 ug via INTRAVENOUS
  Administered 2018-02-08: 25 ug via INTRAVENOUS

## 2018-02-08 MED ORDER — BUPIVACAINE-EPINEPHRINE 0.5% -1:200000 IJ SOLN
INTRAMUSCULAR | Status: DC | PRN
  Administered 2018-02-08: 30 mL

## 2018-02-08 MED ORDER — PROPOFOL 10 MG/ML IV BOLUS
INTRAVENOUS | Status: AC
  Filled 2018-02-08: qty 20

## 2018-02-08 MED ORDER — DEXAMETHASONE SODIUM PHOSPHATE 10 MG/ML IJ SOLN
INTRAMUSCULAR | Status: DC | PRN
Start: 2018-02-08 — End: 2018-02-08
  Administered 2018-02-08: 5 mg via INTRAVENOUS

## 2018-02-08 MED ORDER — ONDANSETRON HCL 4 MG PO TABS: 4.0000 mg | ORAL_TABLET | Freq: Four times a day (QID) | ORAL | Status: DC | PRN

## 2018-02-08 MED ORDER — IRBESARTAN 150 MG PO TABS
300.0000 mg | ORAL_TABLET | Freq: Every day | ORAL | Status: DC
  Administered 2018-02-09: 300 mg via ORAL
  Filled 2018-02-08: qty 2

## 2018-02-08 MED ORDER — PANTOPRAZOLE SODIUM 40 MG PO TBEC
40.0000 mg | DELAYED_RELEASE_TABLET | Freq: Every day | ORAL | Status: DC
  Administered 2018-02-09: 40 mg via ORAL
  Filled 2018-02-08: qty 1

## 2018-02-08 MED ORDER — ROCURONIUM BROMIDE 50 MG/5ML IV SOLN
INTRAVENOUS | Status: AC
  Filled 2018-02-08: qty 1

## 2018-02-08 MED ORDER — FENTANYL CITRATE (PF) 100 MCG/2ML IJ SOLN
INTRAMUSCULAR | Status: AC
  Administered 2018-02-08: 50 ug via INTRAVENOUS
  Filled 2018-02-08: qty 2

## 2018-02-08 MED ORDER — ONDANSETRON HCL 4 MG/2ML IJ SOLN: 4.0000 mg | Freq: Once | INTRAMUSCULAR | Status: DC | PRN

## 2018-02-08 MED ORDER — LACTATED RINGERS IV SOLN
INTRAVENOUS | Status: DC
  Administered 2018-02-08: 12:00:00 via INTRAVENOUS

## 2018-02-08 MED ORDER — SODIUM CHLORIDE 0.9 % IV SOLN
INTRAVENOUS | Status: DC | PRN
  Administered 2018-02-08: 25 ug/min via INTRAVENOUS

## 2018-02-08 MED ORDER — FENTANYL CITRATE (PF) 100 MCG/2ML IJ SOLN
INTRAMUSCULAR | Status: AC
  Filled 2018-02-08: qty 2

## 2018-02-08 MED ORDER — TRAMADOL HCL 50 MG PO TABS
50.0000 mg | ORAL_TABLET | Freq: Four times a day (QID) | ORAL | Status: DC | PRN
  Administered 2018-02-08: 50 mg via ORAL
  Filled 2018-02-08: qty 1

## 2018-02-08 MED ORDER — WARFARIN SODIUM 5 MG PO TABS
5.0000 mg | ORAL_TABLET | Freq: Once | ORAL | Status: AC
  Administered 2018-02-08: 5 mg via ORAL
  Filled 2018-02-08: qty 1

## 2018-02-08 MED ORDER — FAMOTIDINE 20 MG PO TABS
ORAL_TABLET | ORAL | Status: AC
  Filled 2018-02-08: qty 1

## 2018-02-08 MED ORDER — SIMVASTATIN 20 MG PO TABS
10.0000 mg | ORAL_TABLET | Freq: Every evening | ORAL | Status: DC
  Administered 2018-02-08: 10 mg via ORAL
  Filled 2018-02-08: qty 1

## 2018-02-08 MED ORDER — FENTANYL CITRATE (PF) 100 MCG/2ML IJ SOLN: 25.0000 ug | INTRAMUSCULAR | Status: DC | PRN

## 2018-02-08 MED ORDER — CALCIUM CARBONATE-VITAMIN D 500-200 MG-UNIT PO TABS: 1.0000 | ORAL_TABLET | Freq: Every evening | ORAL | Status: DC

## 2018-02-08 MED ORDER — POTASSIUM CHLORIDE ER 8 MEQ PO TBCR
8.0000 meq | EXTENDED_RELEASE_TABLET | Freq: Every evening | ORAL | Status: DC
  Administered 2018-02-08: 8 meq via ORAL
  Filled 2018-02-08 (×2): qty 1

## 2018-02-08 MED ORDER — CLINDAMYCIN PHOSPHATE 900 MG/50ML IV SOLN
INTRAVENOUS | Status: AC
  Filled 2018-02-08: qty 50

## 2018-02-08 MED ORDER — SODIUM CHLORIDE 0.9 % IV SOLN
INTRAVENOUS | Status: DC
  Administered 2018-02-08: 18:00:00 via INTRAVENOUS

## 2018-02-08 MED ORDER — DIPHENHYDRAMINE HCL 12.5 MG/5ML PO ELIX: 12.5000 mg | ORAL_SOLUTION | ORAL | Status: DC | PRN

## 2018-02-08 MED ORDER — ONDANSETRON HCL 4 MG/2ML IJ SOLN
INTRAMUSCULAR | Status: DC | PRN
  Administered 2018-02-08: 4 mg via INTRAVENOUS

## 2018-02-08 MED ORDER — MIDAZOLAM HCL 2 MG/2ML IJ SOLN
INTRAMUSCULAR | Status: AC
  Filled 2018-02-08: qty 2

## 2018-02-08 MED ORDER — WARFARIN SODIUM 2.5 MG PO TABS: 2.5000 mg | ORAL_TABLET | ORAL | Status: DC

## 2018-02-08 MED ORDER — MIDAZOLAM HCL 2 MG/2ML IJ SOLN
INTRAMUSCULAR | Status: DC | PRN
  Administered 2018-02-08 (×2): 1 mg via INTRAVENOUS

## 2018-02-08 MED ORDER — METOCLOPRAMIDE HCL 10 MG PO TABS: 5.0000 mg | ORAL_TABLET | Freq: Three times a day (TID) | ORAL | Status: DC | PRN

## 2018-02-08 MED ORDER — MIDAZOLAM HCL 2 MG/2ML IJ SOLN
INTRAMUSCULAR | Status: AC
  Administered 2018-02-08: 1 mg via INTRAVENOUS
  Filled 2018-02-08: qty 2

## 2018-02-08 MED ORDER — PHENYLEPHRINE HCL 10 MG/ML IJ SOLN
INTRAMUSCULAR | Status: DC | PRN
  Administered 2018-02-08: 100 ug via INTRAVENOUS

## 2018-02-08 MED ORDER — VITAMIN B-12 1000 MCG PO TABS: 500.0000 ug | ORAL_TABLET | Freq: Every evening | ORAL | Status: DC

## 2018-02-08 SURGICAL SUPPLY — 45 items
ANCHOR SUT W/ ORTHOCORD (Anchor) ×3 IMPLANT
ANCHOR TENDON REGENETEN (Staple) ×3 IMPLANT
ANCHORS BONE REGENETEN (Anchor) ×3 IMPLANT
BIT DRILL JUGRKNT W/NDL BIT2.9 (DRILL) IMPLANT
BLADE FULL RADIUS 3.5 (BLADE) ×3 IMPLANT
BUR ACROMIONIZER 4.0 (BURR) ×3 IMPLANT
CANNULA SHAVER 8MMX76MM (CANNULA) ×3 IMPLANT
CHLORAPREP W/TINT 26ML (MISCELLANEOUS) ×3 IMPLANT
COVER MAYO STAND STRL (DRAPES) ×3 IMPLANT
DRAPE IMP U-DRAPE 54X76 (DRAPES) ×6 IMPLANT
DRILL JUGGERKNOT W/NDL BIT 2.9 (DRILL)
ELECT REM PT RETURN 9FT ADLT (ELECTROSURGICAL) ×3
ELECTRODE REM PT RTRN 9FT ADLT (ELECTROSURGICAL) ×1 IMPLANT
GAUZE PETRO XEROFOAM 1X8 (MISCELLANEOUS) ×3 IMPLANT
GAUZE SPONGE 4X4 12PLY STRL (GAUZE/BANDAGES/DRESSINGS) ×3 IMPLANT
GLOVE BIO SURGEON STRL SZ7.5 (GLOVE) ×6 IMPLANT
GLOVE BIO SURGEON STRL SZ8 (GLOVE) ×6 IMPLANT
GLOVE BIOGEL PI IND STRL 8 (GLOVE) ×1 IMPLANT
GLOVE BIOGEL PI INDICATOR 8 (GLOVE) ×2
GLOVE INDICATOR 8.0 STRL GRN (GLOVE) ×3 IMPLANT
GOWN STRL REUS W/ TWL LRG LVL3 (GOWN DISPOSABLE) ×1 IMPLANT
GOWN STRL REUS W/ TWL XL LVL3 (GOWN DISPOSABLE) ×1 IMPLANT
GOWN STRL REUS W/TWL LRG LVL3 (GOWN DISPOSABLE) ×2
GOWN STRL REUS W/TWL XL LVL3 (GOWN DISPOSABLE) ×2
GRASPER SUT 15 45D LOW PRO (SUTURE) ×3 IMPLANT
IMPLANT REGENETEN MEDIUM (Shoulder) ×3 IMPLANT
IV LACTATED RINGER IRRG 3000ML (IV SOLUTION) ×4
IV LR IRRIG 3000ML ARTHROMATIC (IV SOLUTION) ×2 IMPLANT
MANIFOLD NEPTUNE II (INSTRUMENTS) ×3 IMPLANT
MASK FACE SPIDER DISP (MASK) ×3 IMPLANT
MAT BLUE FLOOR 46X72 FLO (MISCELLANEOUS) ×3 IMPLANT
PACK ARTHROSCOPY SHOULDER (MISCELLANEOUS) ×3 IMPLANT
SLING ARM LRG DEEP (SOFTGOODS) IMPLANT
SLING ULTRA II LG (MISCELLANEOUS) IMPLANT
SLING ULTRA II M (MISCELLANEOUS) ×3 IMPLANT
STAPLER SKIN PROX 35W (STAPLE) ×3 IMPLANT
STRAP SAFETY 5IN WIDE (MISCELLANEOUS) ×3 IMPLANT
SUT ETHIBOND 0 MO6 C/R (SUTURE) ×3 IMPLANT
SUT VIC AB 2-0 CT1 27 (SUTURE) ×4
SUT VIC AB 2-0 CT1 TAPERPNT 27 (SUTURE) ×2 IMPLANT
TAPE MICROFOAM 4IN (TAPE) ×3 IMPLANT
TUBING ARTHRO INFLOW-ONLY STRL (TUBING) ×3 IMPLANT
TUBING CONNECTING 10 (TUBING) ×2 IMPLANT
TUBING CONNECTING 10' (TUBING) ×1
WAND HAND CNTRL MULTIVAC 90 (MISCELLANEOUS) ×3 IMPLANT

## 2018-02-08 NOTE — Transfer of Care (Signed)
Immediate Anesthesia Transfer of Care Note  Patient: Shannon Donovan  Procedure(s) Performed: Procedure(s): SHOULDER ARTHROSCOPY WITH OPEN ROTATOR CUFF REPAIR (Left)  Patient Location: PACU  Anesthesia Type:General  Level of Consciousness: sedated  Airway & Oxygen Therapy: Patient Spontanous Breathing and Patient connected to face mask oxygen  Post-op Assessment: Report given to RN and Post -op Vital signs reviewed and stable  Post vital signs: Reviewed and stable  Last Vitals:  Vitals:   02/08/18 1322 02/08/18 1524  BP: (!) 144/83 130/82  Pulse: 82 (!) 118  Resp: 18 (!) 21  Temp:    SpO2: 092% 957%    Complications: No apparent anesthesia complications

## 2018-02-08 NOTE — Discharge Instructions (Signed)
Interscalene Nerve Block with Exparel  1.  For your surgery you have received an Interscalene Nerve Block with Exparel. 2. Nerve Blocks affect many types of nerves, including nerves that control movement, pain and normal sensation.  You may experience feelings such as numbness, tingling, heaviness, weakness or the inability to move your arm or the feeling or sensation that your arm has "fallen asleep". 3. A nerve block with Exparel can last up to 5 days.  Usually the weakness wears off first.  The tingling and heaviness usually wear off next.  Finally you may start to notice pain.  Keep in mind that this may occur in any order.  Once a nerve block starts to wear off it is usually completely gone within 60 minutes. 4. ISNB may cause mild shortness of breath, a hoarse voice, blurry vision, unequal pupils, or drooping of the face on the same side as the nerve block.  These symptoms will usually resolve with the numbness.  Very rarely the procedure itself can cause mild seizures. 5. If needed, your surgeon will give you a prescription for pain medication.  It will take about 60 minutes for the oral pain medication to become fully effective.  So, it is recommended that you start taking this medication before the nerve block first begins to wear off, or when you first begin to feel discomfort. 6. Take your pain medication only as prescribed.  Pain medication can cause sedation and decrease your breathing if you take more than you need for the level of pain that you have. 7. Nausea is a common side effect of many pain medications.  You may want to eat something before taking your pain medicine to prevent nausea. 8. After an Interscalene nerve block, you cannot feel pain, pressure or extremes in temperature in the effected arm.  Because your arm is numb it is at an increased risk for injury.  To decrease the possibility of injury, please practice the following:  a. While you are awake change the position of  your arm frequently to prevent too much pressure on any one area for prolonged periods of time. b.  If you have a cast or tight dressing, check the color or your fingers every couple of hours.  Call your surgeon with the appearance of any discoloration (white or blue). c. If you are given a sling to wear before you go home, please wear it  at all times until the block has completely worn off.  Do not get up at night without your sling. d. Please contact [facility] or your surgeon if you do not begin to regain sensation after 7 days from the surgery.  Anesthesia may be contacted by calling the Same Day Surgery Department, Mon. through Fri., 6 am to 4 pm at [phone].   e. If you experience any other problems or concerns, please contact your surgeon's office. f. If you experience severe or prolonged shortness of breath go to the nearest emergency department.   Keep dressing dry and intact.  May shower after dressing changed on post-op day #4 (Monday).  Cover staples with Band-Aids after drying off. Apply ice frequently to shoulder. Take ibuprofen 600 mg TID with meals for 7-10 days, then as necessary. Take oxycodone as prescribed when needed.  May supplement with ES Tylenol if necessary. Keep shoulder immobilizer on at all times except may remove for bathing purposes. Follow-up in 10-14 days or as scheduled.

## 2018-02-08 NOTE — Anesthesia Procedure Notes (Signed)
Anesthesia Regional Block: Interscalene brachial plexus block   Pre-Anesthetic Checklist: ,, timeout performed, Correct Patient, Correct Site, Correct Laterality, Correct Procedure, Correct Position, site marked, Risks and benefits discussed,  Surgical consent,  Pre-op evaluation,  At surgeon's request and post-op pain management   Prep: chloraprep       Needles:  Injection technique: Single-shot  Needle Type: Echogenic Stimulator Needle     Needle Length: 10cm  Needle Gauge: 20     Additional Needles:   Procedures:, nerve stimulator,,, ultrasound used (permanent image in chart),,,,   Nerve Stimulator or Paresthesia:  Response: biceps flexion,   Additional Responses:   Narrative:  Injection made incrementally with aspirations every 5 mL.  Performed by: Personally   Additional Notes: Functioning IV was confirmed and monitors were applied.  Sterile prep and drape,hand hygiene and sterile gloves were used.  Negative aspiration and negative test dose prior to incremental administration of local anesthetic. The patient tolerated the procedure well.

## 2018-02-08 NOTE — H&P (Signed)
Paper H&P to be scanned into permanent record. H&P reviewed and patient re-examined. No changes. 

## 2018-02-08 NOTE — Op Note (Signed)
02/08/2018  3:06 PM  Patient:   Shannon Donovan  Pre-Op Diagnosis:   Impingement/tendinopathy with partial-thickness rotator cuff tear, left shoulder.  Post-Op Diagnosis: Impingement/tendinopathy with partial-thickness rotator cuff tears, labral fraying, and biceps tendinopathy, left shoulder.  Procedure: Limited arthroscopic debridement, arthroscopic repair of partial thickness subscapularis tendon tear, arthroscopic subacromial decompression, mini-open repair of partial-thickness supraspinatus tendon tear with a [employer] Regeneten patch, and biceps tenolysis, left shoulder.  Anesthesia: General endotracheal with interscalene block placed preoperatively by the anesthesiologist.  Surgeon:   Pascal Lux, MD  Assistant:   Cameron Proud, PA-C  Findings: As above. There was a partial-thickness tear involving the superior 30% of the subscapularis tendon and a second partial-thickness tear involving approximately 40% of the anterior insertional fibers of the supraspinatus tendon. The remainder of the rotator cuff was in excellent condition. There was labral fraying anteriorly and superiorly without frank detachment of the labrum from the glenoid. There were grade 2 chondromalacial changes involving the central portion of the glenoid, but the humeral articular surface was in excellent condition.  Complications: None  Fluids:   700 cc  Estimated blood loss: 5 cc  Tourniquet time: None  Drains: None  Closure: Staples   Brief clinical note: The patient is a 67 year old female with a history of progressively worsening left shoulder pain. The patient's symptoms have progressed despite medications, activity modification, etc. The patient's history and examination are consistent with impingement/tendinopathy with a rotator cuff tear. An MRI scan confirmed the presence of an at least partial thickness rotator cuff tear. The patient presents at this time for  definitive management of these shoulder symptoms.  Procedure: The patient underwent placement of an interscalene block by the anesthesiologist in the preoperative holding area before being brought into the operating room and lain in the supine position. The patient then underwent general endotracheal intubation and anesthesia before being repositioned in the beach chair position using the beach chair positioner. The left shoulder and upper extremity were prepped with ChloraPrep solution before being draped sterilely. Preoperative antibiotics were administered. A timeout was performed to confirm the proper surgical site before the expected portal sites and incision site were injected with 0.5% Sensorcaine with epinephrine. A posterior portal was created and the glenohumeral joint thoroughly inspected with the findings as described above. An anterior portal was created using an outside-in technique. The labrum and rotator cuff were further probed, again confirming the above-noted findings. The areas of labral fraying were debrided back to stable margins using the full-radius resector, as were the torn margins of the supraspinatus and subscapularis tendons. The ArthroCare wand was inserted and used to release the biceps tendon from its labral anchor. It also was used to obtain hemostasis as well as to "anneal" the labrum superiorly and anteriorly.   The subscapularis tendon was repaired using a single Mitek BioKnotless anchor placed through the anterior portal after roughening the bony surface with the full-radius resector. A superolateral portal was created using an outside in technique to act as a working portal to facilitate this repair. The repair was noted to be stable both to probing as well as with external rotation following this repair. The instruments were removed from the joint after suctioning the excess fluid.  The camera was repositioned through the posterior portal into the subacromial space. A  separate lateral portal was created using an outside-in technique. The 3.5 mm full-radius resector was introduced and used to perform a subtotal bursectomy. The ArthroCare wand was then inserted and  used to remove the periosteal tissue off the undersurface of the anterior third of the acromion as well as to recess the coracoacromial ligament from its attachment along the anterior and lateral margins of the acromion. The 4.0 mm acromionizing bur was introduced and used to complete the decompression by removing the undersurface of the anterior third of the acromion. The full radius resector was reintroduced to remove any residual bony debris before the ArthroCare wand was reintroduced to obtain hemostasis. The instruments were then removed from the subacromial space after suctioning the excess fluid.  An approximately 3.5-4 cm incision was made over the anterolateral aspect of the shoulder beginning at the anterolateral corner of the acromion and extending distally in line with the bicipital groove. This incision was carried down through the subcutaneous tissues to expose the deltoid fascia. The raphae between the anterior and middle thirds was identified and this plane developed to provide access into the subacromial space. Additional bursal tissues were debrided sharply using Metzenbaum scissors. The rotator cuff tear was carefully inspected.  There was no evidence of any bursal surface tearing, nor was there any evidence of a full-thickness tear. Therefore, it was elected to proceed with the application of a [place] patch over the area of partial-thickness tearing involving the anterior insertional fibers of the supraspinatus tendon as identified by palpation.  This patch was secured using 3 bone anchors laterally and 8 soft tissue staples distributed anteriorly, medially, and posteriorly.  Given the patient's age and the size of the patient's biceps tendon, it was elected not to proceed with a  biceps tenodesis.  The wound was copiously irrigated with sterile saline solution before the deltoid raphae was reapproximated using 2-0 Vicryl interrupted sutures. The subcutaneous tissues were closed in two layers using 2-0 Vicryl interrupted sutures before the skin was closed using staples. The portal sites also were closed using staples. A sterile bulky dressing was applied to the shoulder before the arm was placed into a shoulder immobilizer. The patient was then awakened, extubated, and returned to the recovery room in satisfactory condition after tolerating the procedure well.

## 2018-02-08 NOTE — Progress Notes (Signed)
[place] for Warfarin Indication: atrial fibrillation  Allergies  Allergen Reactions  . Penicillins Other (See Comments)    Has patient had a PCN reaction causing immediate rash, facial/tongue/throat swelling, SOB or lightheadedness with hypotension: Unknown Has patient had a PCN reaction causing severe rash involving mucus membranes or skin necrosis: Unknown Has patient had a PCN reaction that required hospitalization: No Has patient had a PCN reaction occurring within the last 10 years: No Childhood reaction If all of the above answers are "NO", then may proceed with Cephalosporin use.   . Latex Rash    Patient Measurements: Height: 4\' 11"  (149.9 cm) Weight: 107 lb (48.5 kg) IBW/kg (Calculated) : 43.2  Vital Signs: Temp: 98.2 F (36.8 C) (03/28 1145) BP: 144/83 (03/28 1322) Pulse Rate: 82 (03/28 1322)  Labs: No results for input(s): HGB, HCT, PLT, APTT, LABPROT, INR, HEPARINUNFRC, HEPRLOWMOCWT, CREATININE, CKTOTAL, CKMB, TROPONINI in the last 72 hours.  CrCl cannot be calculated (No order found.).   Medical History: Past Medical History:  Diagnosis Date  . Adolescent scoliosis   . Atrial fibrillation ([place])   . Dysrhythmia   . Hyperlipidemia   . Hypertension   . Short-term memory loss   . Stroke [facility])     Assessment: 67 y/o F with a h/o atrial fibrillation admitted for shoulder repair to resume warfarin. Patient was taking warfarin 2.5 mg daily except 5 mg on Saturdays PTA.   Goal of Therapy:  INR 2-3 Monitor platelets by anticoagulation protocol: Yes   Plan:  Warfarin 5 mg tonight per MD. Will f/u INR in AM.   Ulice Dash D 02/08/2018,2:33 PM

## 2018-02-08 NOTE — Anesthesia Post-op Follow-up Note (Signed)
Anesthesia QCDR form completed.        

## 2018-02-08 NOTE — Progress Notes (Signed)
Patient up to bsc x 2 with minimal assist.

## 2018-02-08 NOTE — Anesthesia Preprocedure Evaluation (Signed)
Anesthesia Evaluation  Patient identified by MRN, date of birth, ID band Patient awake    Reviewed: Allergy & Precautions, H&P , NPO status , Patient's Chart, lab work & pertinent test results, reviewed documented beta blocker date and time   Airway Mallampati: III  TM Distance: >3 FB Neck ROM: full    Dental  (+) Teeth Intact   Pulmonary neg pulmonary ROS,    Pulmonary exam normal        Cardiovascular Exercise Tolerance: Good hypertension, negative cardio ROS Normal cardiovascular exam+ dysrhythmias  Rhythm:regular Rate:Normal     Neuro/Psych CVA, No Residual Symptoms negative neurological ROS  negative psych ROS   GI/Hepatic negative GI ROS, Neg liver ROS,   Endo/Other  negative endocrine ROS  Renal/GU negative Renal ROS  negative genitourinary   Musculoskeletal   Abdominal   Peds  Hematology negative hematology ROS (+)   Anesthesia Other Findings Past Medical History: No date: Adolescent scoliosis No date: Atrial fibrillation ([place]) No date: Dysrhythmia No date: Hyperlipidemia No date: Hypertension No date: Short-term memory loss No date: Stroke [facility]) Past Surgical History: No date: BACK SURGERY 09/05/2016: COLONOSCOPY; N/A     Comment:  Procedure: COLONOSCOPY;  Surgeon: Lollie Sails, MD;              Location: [facility] ENDOSCOPY;  Service: Endoscopy;                Laterality: N/A;  Pt on Coumadin (PT/ INR) No date: TONSILLECTOMY BMI    Body Mass Index:  21.61 kg/m     Reproductive/Obstetrics negative OB ROS                             Anesthesia Physical Anesthesia Plan  ASA: III  Anesthesia Plan: General ETT   Post-op Pain Management:  Regional for Post-op pain   Induction:   PONV Risk Score and Plan:   Airway Management Planned:   Additional Equipment:   Intra-op Plan:   Post-operative Plan:   Informed Consent: I have reviewed the patients History and  Physical, chart, labs and discussed the procedure including the risks, benefits and alternatives for the proposed anesthesia with the patient or authorized representative who has indicated his/her understanding and acceptance.   Dental Advisory Given  Plan Discussed with: CRNA  Anesthesia Plan Comments:         Anesthesia Quick Evaluation

## 2018-02-08 NOTE — NC FL2 (Signed)
[place] LEVEL OF CARE SCREENING TOOL     IDENTIFICATION  Patient Name: Shannon Donovan Birthdate: 1951/06/06 Sex: female Admission Date (Current Location): 02/08/2018  [place] and [place] Number:  Engineering geologist and Address:  [facility], [address] [place], [place] [address]      Provider Number: [id number]  Attending Physician Name and Address:  Corky Mull, MD  Relative Name and Phone Number:       Current Level of Care: Hospital Recommended Level of Care: [place] Prior Approval Number:    Date Approved/Denied:   PASRR Number: ([id number] )  Discharge Plan: SNF    Current Diagnoses: Patient Active Problem List   Diagnosis Date Noted  . Left rotator cuff tear 02/08/2018    Orientation RESPIRATION BLADDER Height & Weight     Self, Time, Situation, Place  O2(2L Nasal Cannula) Continent Weight: 107 lb (48.5 kg) Height:  4\' 11"  (149.9 cm)  BEHAVIORAL SYMPTOMS/MOOD NEUROLOGICAL BOWEL NUTRITION STATUS      Continent Diet  AMBULATORY STATUS COMMUNICATION OF NEEDS Skin   Extensive Assist Verbally Surgical wounds(Incision Left Shoulder)                       Personal Care Assistance Level of Assistance  Bathing, Feeding, Dressing Bathing Assistance: Limited assistance Feeding assistance: Independent Dressing Assistance: Limited assistance     Functional Limitations Info  Sight, Hearing, Speech Sight Info: Adequate Hearing Info: Adequate Speech Info: Adequate    SPECIAL CARE FACTORS FREQUENCY  PT (By licensed PT), OT (By licensed OT)     PT Frequency: (5) OT Frequency: (5)            Contractures      Additional Factors Info  Code Status, Allergies Code Status Info: (Not on file) Allergies Info: (PENICILLINS, LATEX )           Current Medications (02/08/2018):  This is the current hospital active medication list Current Facility-Administered Medications  Medication  Dose Route Frequency Provider Last Rate Last Dose  . clindamycin (CLEOCIN) 900 MG/50ML IVPB           . famotidine (PEPCID) 20 MG tablet           . fentaNYL (SUBLIMAZE) injection 25 mcg  25 mcg Intravenous Q5 min PRN Molli Barrows, MD      . lactated ringers infusion   Intravenous Continuous Gunnar Fusi, MD 75 mL/hr at 02/08/18 1150    . lidocaine (PF) (XYLOCAINE) 1 % injection           . ondansetron (ZOFRAN) injection 4 mg  4 mg Intravenous Once PRN Molli Barrows, MD       Facility-Administered Medications Ordered in Other Encounters  Medication Dose Route Frequency Provider Last Rate Last Dose  . bupivacaine (MARCAINE) 0.5 % injection    Anesthesia Intra-op Molli Barrows, MD   10 mL at 02/08/18 1235  . bupivacaine liposome (EXPAREL) 1.3 % injection    Anesthesia Intra-op Molli Barrows, MD   20 mL at 02/08/18 1235  . dexamethasone (DECADRON) injection   Intravenous Anesthesia Intra-op Dawayne Cirri I, CRNA   5 mg at 02/08/18 1423  . fentaNYL (SUBLIMAZE) injection    Anesthesia Intra-op Dawayne Cirri I, CRNA   25 mcg at 02/08/18 1504  . glycopyrrolate (ROBINUL) injection    Anesthesia Intra-op Dawayne Cirri I, CRNA   0.2 mg at 02/08/18 1342  .  lidocaine (cardiac) 100 mg/11ml (XYLOCAINE) 20 MG/ML injection 2%   Intravenous Anesthesia Intra-op Dawayne Cirri I, CRNA   50 mg at 02/08/18 1339  . midazolam (VERSED) injection    Anesthesia Intra-op Dawayne Cirri I, CRNA   1 mg at 02/08/18 1341  . ondansetron (ZOFRAN) injection   Intravenous Anesthesia Intra-op Dawayne Cirri I, CRNA   4 mg at 02/08/18 1423  . phenylephrine (NEO-SYNEPHRINE) 100 mcg/mL in sodium chloride 0.9 % 100 mL infusion   Intravenous Continuous PRN Dawayne Cirri I, CRNA 15 mL/hr at 02/08/18 1426 25 mcg/min at 02/08/18 1426  . phenylephrine (NEO-SYNEPHRINE) injection   Intravenous Anesthesia Intra-op Dawayne Cirri I, CRNA   100 mcg at 02/08/18 1359  . propofol (DIPRIVAN) 10 mg/mL bolus/IV push     Anesthesia Intra-op Dawayne Cirri I, CRNA   140 mg at 02/08/18 1339  . rocuronium [facility]) injection    Anesthesia Intra-op Dawayne Cirri I, CRNA   40 mg at 02/08/18 1339  . sugammadex sodium (BRIDION) injection   Intravenous Anesthesia Intra-op Doreen Salvage, CRNA   150 mg at 02/08/18 1510     Discharge Medications: Please see discharge summary for a list of discharge medications.  Relevant Imaging Results:  Relevant Lab Results:   Additional Information (SSN: [id number])  Smith Mince, Student-Social Work

## 2018-02-09 ENCOUNTER — Encounter: Payer: Self-pay | Admitting: Surgery

## 2018-02-09 DIAGNOSIS — M75122 Complete rotator cuff tear or rupture of left shoulder, not specified as traumatic: Secondary | ICD-10-CM | POA: Diagnosis not present

## 2018-02-09 LAB — BASIC METABOLIC PANEL
Anion gap: 11 (ref 5–15)
BUN: 8 mg/dL (ref 6–20)
CHLORIDE: 105 mmol/L (ref 101–111)
CO2: 23 mmol/L (ref 22–32)
CREATININE: 0.7 mg/dL (ref 0.44–1.00)
Calcium: 9 mg/dL (ref 8.9–10.3)
GFR calc non Af Amer: >60 mL/min (ref >60)
GLUCOSE: 146 mg/dL — AB (ref 65–99)
Potassium: 3.9 mmol/L (ref 3.5–5.1)
Sodium: 139 mmol/L (ref 135–145)

## 2018-02-09 LAB — PROTIME-INR
INR: 1.03
Prothrombin Time: 13.4 seconds (ref 11.4–15.2)

## 2018-02-09 MED ORDER — OXYCODONE HCL 5 MG PO TABS: 5.0000 mg | ORAL_TABLET | ORAL | 0 refills | Status: DC | PRN

## 2018-02-09 NOTE — Care Management Note (Signed)
Case Management Note  Patient Details  Name: Shannon Donovan MRN: [id number] Date of Birth: August 31, 1951  Subjective/Objective:  POD # 1 Rotator cuff repair. Met with patient and her brother [name] at bedside. Patient lives alone but is forgetful and is not considered safe to be alone all the time. [name] plan to hire private sitters to stay with his sister. He is also interested in home health. Offered a list of agencies. [facility] chose [facility]. [facility] to [facility] for PT, OT and HHA. Discuss frequency of visits. Patient has a walker and elevated toilet seat. [name] will be the main contact for home health. Corene Cornea with [facility] made aware. Updated Cameron Proud, PA of POC.                 Action/Plan: [facility] for PT, OT and HHA  Expected Discharge Date:                  Expected Discharge Plan:  [place]  In-House Referral:     Discharge planning Services  CM Consult  Post Acute Care Choice:  Home Health Choice offered to:  Patient, Sibling  DME Arranged:    DME Agency:     HH Arranged:  PT, OT, Nurse's Aide [place] Agency:  [facility]  Status of Service:  Completed, signed off  If discussed at [place] of Stay Meetings, dates discussed:    Additional Comments:  Jolly Mango, RN 02/09/2018, 9:08 AM

## 2018-02-09 NOTE — Clinical Social Work Note (Signed)
Clinical Social Work Assessment  Patient Details  Name: Shannon Donovan MRN: [id number] Date of Birth: [birth year]  Date of referral:  02/09/18               Reason for consult:                   Permission sought to share information with:    Permission granted to share information::     Name::        Agency::     Relationship::     Contact Information:     Housing/Transportation Living arrangements for the past 2 months:  Apartment Source of Information:  Patient, Siblings Patient Interpreter Needed:  None Criminal Activity/Legal Involvement Pertinent to Current Situation/Hospitalization:  No - Comment as needed Significant Relationships:  Siblings Lives with:  Self Do you feel safe going back to the place where you live?  Yes Need for family participation in patient care:  Yes (Comment)  Care giving concerns:  Patient lives in a [place] alone in [place].    Social Worker assessment / plan:  Holiday representative (CSW) received SNF consult. PT is recommending home health. Per Dr. Roland Rack and nurse patient and her brother Shannon Donovan requested SNF placement because of patient's cognitive functioning. CSW met with patient and her brother Shannon Donovan [phone] was at bedside. CSW introduced self and explained role of CSW department. Patient reported that she lives alone. Per Shannon Donovan he lives 3 hours away in [place], [place]. [place] explained that medicare requires a 3 night qualifying inpatient stay in a hospital in order to pay for SNF. Patient is in observation status. CSW explained that medicare will not pay for SNF and offered private pay SNF. Per Shannon Donovan patient can't pay privately for SNF. CSW explained home health options. Shannon Donovan reported that patient needs someone to help her cook and clean. CSW explained private duty caregiver options. RN case manager aware of above.   Plan is for patient to D/C home with home health and will pay privately for caregivers. RN aware of above. Please reconsult if  future social work needs arise. CSW signing off.   Employment status:  Retired Forensic scientist:  Medicare PT Recommendations:  Home with [place] / Referral to community resources:  Other (Comment Required)(Patient will D/C home with home health. )  Patient/Family's Response to care:  Patient and her brother Shannon Donovan requested to be placed in a SNF however there is no payer. Patient and Shannon Donovan understand patient will have to D/C home.   Patient/Family's Understanding of and Emotional Response to Diagnosis, Current Treatment, and Prognosis:  Patient and her brother Shannon Donovan were pleasant and thanked CSW for assistance.   Emotional Assessment Appearance:  Appears stated age Attitude/Demeanor/Rapport:    Affect (typically observed):  Accepting, Adaptable, Pleasant Orientation:  Oriented to Self, Oriented to Place, Oriented to  Time, Oriented to Situation, Fluctuating Orientation (Suspected and/or reported Sundowners) Alcohol / Substance use:  Not Applicable Psych involvement (Current and /or in the community):  No (Comment)  Discharge Needs  Concerns to be addressed:  No discharge needs identified Readmission within the last 30 days:  No Current discharge risk:  None Barriers to Discharge:  No Barriers Identified   Ajai Harville, Veronia Beets, LCSW 02/09/2018, 1:37 PM

## 2018-02-09 NOTE — Anesthesia Postprocedure Evaluation (Signed)
Anesthesia Post Note  Patient: Dannielle Baskins  Procedure(s) Performed: SHOULDER ARTHROSCOPY WITH OPEN ROTATOR CUFF REPAIR (Left Shoulder)  Patient location during evaluation: PACU Anesthesia Type: General Level of consciousness: awake and alert Pain management: pain level controlled Vital Signs Assessment: post-procedure vital signs reviewed and stable Respiratory status: spontaneous breathing, nonlabored ventilation, respiratory function stable and patient connected to nasal cannula oxygen Cardiovascular status: blood pressure returned to baseline and stable Postop Assessment: no apparent nausea or vomiting Anesthetic complications: no     Last Vitals:  Vitals:   02/09/18 0410 02/09/18 0836  BP: (!) 151/88 121/80  Pulse: 78 (!) 115  Resp: 19 18  Temp: 36.4 C   SpO2: 100% 97%    Last Pain:  Vitals:   02/09/18 0604  TempSrc:   PainSc: Asleep                 Molli Barrows

## 2018-02-09 NOTE — Care Management Obs Status (Signed)
[facility] NOTIFICATION   Patient Details  Name: Shannon Donovan MRN: [id number] Date of Birth: 11/24/1950   Medicare Observation Status Notification Given:  Yes    Jolly Mango, RN 02/09/2018, 8:50 AM

## 2018-02-09 NOTE — Discharge Summary (Signed)
Physician Discharge Summary  Patient ID: Shannon Donovan MRN: [id number] DOB/AGE: [birth year] 67 y.o.  Admit date: 02/08/2018 Discharge date: 02/09/2018  Admission Diagnoses:  COMPLETE TEAR OF LEFT ROTATOR CUFF,CHRONIC LEFT SHOULDER PAIN  Discharge Diagnoses: Patient Active Problem List   Diagnosis Date Noted  . Left rotator cuff tear 02/08/2018  Complete tear of left rotator cuff  Past Medical History:  Diagnosis Date  . Adolescent scoliosis   . Atrial fibrillation ([place])   . Dysrhythmia   . Hyperlipidemia   . Hypertension   . Short-term memory loss   . Stroke [facility])      Transfusion: None.   Consultants (if any):   Discharged Condition: Improved  Hospital Course: Shannon Donovan is an 67 y.o. female who was admitted 02/08/2018 with a diagnosis of complete tear of the left rotator cuff and went to the operating room on 02/08/2018 and underwent the above named procedures.    Surgeries: Procedure(s): SHOULDER ARTHROSCOPY WITH OPEN ROTATOR CUFF REPAIR on 02/08/2018 Patient tolerated the surgery well. Taken to PACU where she was stabilized and then transferred to the orthopedic floor.  She was continued on Warfarin following surgery. Foot pumps applied bilaterally at 80 mm. Heels elevated on bed with rolled towels. No evidence of DVT. Negative Homan. Physical therapy started on day #1 for gait training and transfer. OT started day #1 for ADL and assisted devices.  Patient's IV was removed on POD1.  Implants: Tamala Julian and Tour manager.  She was given perioperative antibiotics:  Anti-infectives (From admission, onward)   Start     Dose/Rate Route Frequency Ordered Stop   02/08/18 1056  clindamycin (CLEOCIN) 900 MG/50ML IVPB    Note to Pharmacy:  Algis Liming   : cabinet override      02/08/18 1056 02/08/18 2314   02/08/18 0000  clindamycin (CLEOCIN) IVPB 900 mg     900 mg 100 mL/hr over 30 Minutes Intravenous  Once 02/07/18 2345 02/08/18 1410    .  She was  given sequential compression devices, early ambulation, and Warfarin for DVT prophylaxis.  She benefited maximally from the hospital stay and there were no complications.    Recent vital signs:  Vitals:   02/09/18 0410 02/09/18 0836  BP: (!) 151/88 121/80  Pulse: 78 (!) 115  Resp: 19 18  Temp: 97.6 F (36.4 C)   SpO2: 100% 97%    Recent laboratory studies:  Lab Results  Component Value Date   HGB 15.1 02/05/2018   Lab Results  Component Value Date   WBC 6.5 02/05/2018   PLT 251 02/05/2018   Lab Results  Component Value Date   INR 1.03 02/09/2018   Lab Results  Component Value Date   NA 139 02/09/2018   K 3.9 02/09/2018   CL 105 02/09/2018   CO2 23 02/09/2018   BUN 8 02/09/2018   CREATININE 0.70 02/09/2018   GLUCOSE 146 (H) 02/09/2018    Discharge Medications:   Allergies as of 02/09/2018      Reactions   Penicillins Other (See Comments)   Has patient had a PCN reaction causing immediate rash, facial/tongue/throat swelling, SOB or lightheadedness with hypotension: Unknown Has patient had a PCN reaction causing severe rash involving mucus membranes or skin necrosis: Unknown Has patient had a PCN reaction that required hospitalization: No Has patient had a PCN reaction occurring within the last 10 years: No Childhood reaction If all of the above answers are "NO", then may proceed with Cephalosporin use.  Latex Rash      Medication List    TAKE these medications   acetaminophen 500 MG tablet Commonly known as:  TYLENOL Take 1,000 mg by mouth every 6 (six) hours as needed (for pain/headaches.).   AZOR 5-40 MG tablet Generic drug:  amLODipine-olmesartan Take 1 tablet by mouth every evening.   BEN GAY EX Apply 1 application topically 3 (three) times daily as needed (for shoulder pain/muscle aches).   CALCIUM 500 +D 500-400 MG-UNIT Tabs Generic drug:  Calcium Carb-Cholecalciferol Take 1 tablet by mouth every evening.   D3-1000 PO Take 1,000 Units by  mouth every evening.   oxyCODONE 5 MG immediate release tablet Commonly known as:  Oxy IR/ROXICODONE Take 1-2 tablets (5-10 mg total) by mouth every 4 (four) hours as needed for moderate pain.   Potassium Gluconate 550 (90 K) MG Tabs Take 550 mg by mouth every evening.   simvastatin 10 MG tablet Commonly known as:  ZOCOR Take 10 mg by mouth every evening.   Vitamin B-12 500 MCG Subl Place 500 mcg under the tongue every evening.   warfarin 5 MG tablet Commonly known as:  COUMADIN Take 2.5-5 mg by mouth every evening. Take 1 tablet (5 mg) by mouth on Saturdays in the evening, and take 0.5 tablet (2.5 mg) by mouth on all other day of the week in the evening.       Diagnostic Studies: No results found.  Disposition: Plan is for discharge home this afternoon with [facility] PT, OT and nursing.    Follow-up Information    Lattie Corns, PA-C Follow up in 14 day(s).   Specialty:  Physician Assistant Why:  Electa Sniff information: [place] [address] [phone]          Signed: Judson Roch PA-C 02/09/2018, 10:57 AM

## 2018-02-09 NOTE — Progress Notes (Addendum)
  Subjective: 1 Day Post-Op Procedure(s) (LRB): SHOULDER ARTHROSCOPY WITH OPEN ROTATOR CUFF REPAIR (Left) Patient reports pain as mild however arm still numb this morning. Patient is well but has severe issues with short term memory loss and the ability to ambulate safely Care Management to assist with discharge planning. Negative for chest pain and shortness of breath Fever: no Gastrointestinal:Negative for nausea and vomiting  Objective: Vital signs in last 24 hours: Temp:  [97 F (36.1 C)-98.2 F (36.8 C)] 97.6 F (36.4 C) (03/29 0410) Pulse Rate:  [78-118] 78 (03/29 0410) Resp:  [13-28] 19 (03/29 0410) BP: (129-166)/(72-97) 151/88 (03/29 0410) SpO2:  [97 %-100 %] 100 % (03/29 0410) FiO2 (%):  [21 %] 21 % (03/28 1635) Weight:  [48.5 kg (107 lb)] 48.5 kg (107 lb) (03/28 1145)  Intake/Output from previous day:  Intake/Output Summary (Last 24 hours) at 02/09/2018 0755 Last data filed at 02/09/2018 0625 Gross per 24 hour  Intake 1943.75 ml  Output 550 ml  Net 1393.75 ml    Intake/Output this shift: No intake/output data recorded.  Labs: No results for input(s): HGB in the last 72 hours. No results for input(s): WBC, RBC, HCT, PLT in the last 72 hours. Recent Labs    02/09/18 0326  NA 139  K 3.9  CL 105  CO2 23  BUN 8  CREATININE 0.70  GLUCOSE 146*  CALCIUM 9.0   Recent Labs    02/09/18 0326  INR 1.03     EXAM General - Patient is Alert and Appropriate Extremity - ABD soft Sensation intact distally Intact pulses distally Dorsiflexion/Plantar flexion intact Incision: dressing C/D/I No cellulitis present Dressing/Incision - clean, dry, no drainage Patient has already come out of sling this morning, had to adjust sling for the patient. Motor Function - intact, moving foot and toes well on exam.  Abdomen soft with palpation.  Normal BS.  Past Medical History:  Diagnosis Date  . Adolescent scoliosis   . Atrial fibrillation ([place])   . Dysrhythmia   .  Hyperlipidemia   . Hypertension   . Short-term memory loss   . Stroke [facility])     Assessment/Plan: 1 Day Post-Op Procedure(s) (LRB): SHOULDER ARTHROSCOPY WITH OPEN ROTATOR CUFF REPAIR (Left) Active Problems:   Left rotator cuff tear  Estimated body mass index is 21.61 kg/m as calculated from the following:   Height as of this encounter: 4\' 11"  (1.499 m).   Weight as of this encounter: 48.5 kg (107 lb). Advance diet Up with therapy D/C IV fluids when tolerating po intake.  Labs reviewed this AM. Up with therapy today. Patient has a history of severe short term memory issues.  Sling already came off this morning. Pt will be discharged home today with HHPT, OT and nursing for safety. Care management to assist with discharge planning. Plan will be for discharge home this afternoon.  DVT Prophylaxis - Coumadin, Foot Pumps and TED hose Non-weightbearing to the left arm.  Raquel Adrian Specht, PA-C [facility] 02/09/2018, 7:55 AM

## 2018-02-09 NOTE — Evaluation (Addendum)
Physical Therapy Evaluation Patient Details Name: Shannon Donovan MRN: [id number] DOB: [birth year] Today's Date: 02/09/2018   History of Present Illness  Pt admitted for L rotator cuff tear and is s/p shoulder arthroscopy with repair. History includes scoliosis, HTN, and CVA. Of note, per chart review, pt with issues with short term memory loss. Current orders for L UE NWB  Clinical Impression  Pt is a pleasant 67 year old female who was admitted for L rotator cuff tear and is s/p shoulder arthroscopy with repair. Pt performs bed mobility with mod I, transfers with cga, and ambulation with cga and IV pole. Pt demonstrates deficits with strength/balance/safety. Attempted ambulation with SPC, however pt unsafe with AD. Safe technique with stair training. Would benefit from skilled PT to address above deficits and promote optimal return to PLOF. Recommend transition to [place] upon discharge from acute hospitalization. Due to safety impairments, would also recommend supervision and assist for mobility. Per CM, private sitters available.       Follow Up Recommendations Home health PT;Supervision for mobility/OOB    Equipment Recommendations       Recommendations for Other Services       Precautions / Restrictions Precautions Precautions: Fall;Shoulder Shoulder Interventions: Shoulder abduction pillow Precaution Booklet Issued: Yes (comment)(tech delivered) Restrictions Weight Bearing Restrictions: Yes LUE Weight Bearing: Non weight bearing      Mobility  Bed Mobility Overal bed mobility: Modified Independent             General bed mobility comments: used rail for safety  Transfers Overall transfer level: Needs assistance Equipment used: 1 person hand held assist Transfers: Sit to/from Stand Sit to Stand: Min guard         General transfer comment: cued to push from seated surface as she tries to pull up on therapist.  Ambulation/Gait Ambulation/Gait assistance: Min  guard Ambulation Distance (Feet): 250 Feet Assistive device: (IV pole ) Gait Pattern/deviations: Step-through pattern     General Gait Details: ambulated using IV pole and then bouts of ambulation without AD. Slight unsteadiness noted. Further attempts of ambulation with AD in flowsheets  Stairs Stairs: Yes Stairs assistance: Min guard Stair Management: One rail Right Number of Stairs: 1 General stair comments: performed safe technique with using rail as door post. Pt able to safely enter/exit home  Wheelchair Mobility    Modified Rankin (Stroke Patients Only)       Balance Overall balance assessment: Needs assistance Sitting-balance support: Feet supported Sitting balance-Leahy Scale: Good     Standing balance support: Single extremity supported Standing balance-Leahy Scale: Good                               Pertinent Vitals/Pain Pain Assessment: No/denies pain    Home Living Family/patient expects to be discharged to:: Private residence Living Arrangements: Alone   Type of Home: House Home Access: Stairs to enter Entrance Stairs-Rails: (holds onto door frame) Technical brewer of Steps: 1 Home Layout: Able to live on main level with bedroom/bathroom Home Equipment: Gilford Rile - 2 wheels(elevated toliet)      Prior Function Level of Independence: Independent with assistive device(s)         Comments: indep inside house, however uses RW for community distances     Hand Dominance        Extremity/Trunk Assessment   Upper Extremity Assessment Upper Extremity Assessment: (L UE not tested; R UE grossly WNL)    Lower Extremity  Assessment Lower Extremity Assessment: Overall WFL for tasks assessed       Communication   Communication: No difficulties  Cognition Arousal/Alertness: Awake/alert Behavior During Therapy: WFL for tasks assessed/performed Overall Cognitive Status: (short term memory deficits)                                         General Comments      Exercises Other Exercises Other Exercises: ambulated 36' with SPC with cga. Pt struggled with difficulty with sequencing using SPC. Pt carrying SPC at times. Further ambulation performed without AD. Other Exercises: Reviewed HEP including L hand,wrist, elbow, and neck ther-ex. Pt performed x 10 reps with min assist.    Assessment/Plan    PT Assessment Patient needs continued PT services  PT Problem List Decreased strength;Decreased balance;Decreased mobility;Decreased cognition       PT Treatment Interventions DME instruction;Gait training;Stair training;Therapeutic exercise;Balance training    PT Goals (Current goals can be found in the Care Plan section)  Acute Rehab PT Goals Patient Stated Goal: to go home PT Goal Formulation: With patient Time For Goal Achievement: 02/23/18 Potential to Achieve Goals: Good    Frequency BID   Barriers to discharge Decreased caregiver support      Co-evaluation               AM-PAC PT "6 Clicks" Daily Activity  Outcome Measure Difficulty turning over in bed (including adjusting bedclothes, sheets and blankets)?: None Difficulty moving from lying on back to sitting on the side of the bed? : None Difficulty sitting down on and standing up from a chair with arms (e.g., wheelchair, bedside commode, etc,.)?: Unable Help needed moving to and from a bed to chair (including a wheelchair)?: A Little Help needed walking in hospital room?: A Little Help needed climbing 3-5 steps with a railing? : A Little 6 Click Score: 18    End of Session Equipment Utilized During Treatment: Gait belt Activity Tolerance: Patient tolerated treatment well Patient left: in chair;with chair alarm set;with family/visitor present Nurse Communication: Mobility status PT Visit Diagnosis: Unsteadiness on feet (R26.81);Muscle weakness (generalized) (M62.81);Difficulty in walking, not elsewhere classified (R26.2)     Time: 2800-3491 PT Time Calculation (min) (ACUTE ONLY): 38 min   Charges:   PT Evaluation $PT Eval Low Complexity: 1 Low PT Treatments $Gait Training: 8-22 mins $Therapeutic Exercise: 8-22 mins   PT G Codes:        Greggory Stallion, PT, DPT [phone]   Kingstyn Deruiter 02/09/2018, 10:23 AM

## 2018-04-23 ENCOUNTER — Encounter: Payer: Self-pay | Admitting: Anesthesiology

## 2018-04-23 NOTE — Discharge Instructions (Signed)
General Anesthesia, Adult, Care After °These instructions provide you with information about caring for yourself after your procedure. Your health care provider may also give you more specific instructions. Your treatment has been planned according to current medical practices, but problems sometimes occur. Call your health care provider if you have any problems or questions after your procedure. °What can I expect after the procedure? °After the procedure, it is common to have: °· Vomiting. °· A sore throat. °· Mental slowness. ° °It is common to feel: °· Nauseous. °· Cold or shivery. °· Sleepy. °· Tired. °· Sore or achy, even in parts of your body where you did not have surgery. ° °Follow these instructions at home: °For at least 24 hours after the procedure: °· Do not: °? Participate in activities where you could fall or become injured. °? Drive. °? Use heavy machinery. °? Drink alcohol. °? Take sleeping pills or medicines that cause drowsiness. °? Make important decisions or sign legal documents. °? Take care of children on your own. °· Rest. °Eating and drinking °· If you vomit, drink water, juice, or soup when you can drink without vomiting. °· Drink enough fluid to keep your urine clear or pale yellow. °· Make sure you have little or no nausea before eating solid foods. °· Follow the diet recommended by your health care provider. °General instructions °· Have a responsible adult stay with you until you are awake and alert. °· Return to your normal activities as told by your health care provider. Ask your health care provider what activities are safe for you. °· Take over-the-counter and prescription medicines only as told by your health care provider. °· If you smoke, do not smoke without supervision. °· Keep all follow-up visits as told by your health care provider. This is important. °Contact a health care provider if: °· You continue to have nausea or vomiting at home, and medicines are not helpful. °· You  cannot drink fluids or start eating again. °· You cannot urinate after 8-12 hours. °· You develop a skin rash. °· You have fever. °· You have increasing redness at the site of your procedure. °Get help right away if: °· You have difficulty breathing. °· You have chest pain. °· You have unexpected bleeding. °· You feel that you are having a life-threatening or urgent problem. °This information is not intended to replace advice given to you by your health care provider. Make sure you discuss any questions you have with your health care provider. °Document Released: 02/06/2001 Document Revised: 04/04/2016 Document Reviewed: 10/15/2015 °Elsevier Interactive Patient Education © 2018 Elsevier Inc. ° °

## 2018-04-25 ENCOUNTER — Ambulatory Visit
Admission: RE | Admit: 2018-04-25 | Discharge: 2018-04-25 | Disposition: A | Discharge status: Home / Self Care | Payer: Medicare Other | Source: Ambulatory Visit | Attending: Surgery | Admitting: Surgery

## 2018-04-25 ENCOUNTER — Encounter
Admission: RE | Disposition: A | Discharge status: Home / Self Care | Payer: Self-pay | Source: Ambulatory Visit | Attending: Surgery

## 2018-04-25 SURGERY — MANIPULATION, JOINT, SHOULDER, WITH ANESTHESIA
Anesthesia: Choice | Laterality: Left

## 2018-04-25 MED ORDER — LACTATED RINGERS IV SOLN: INTRAVENOUS | Status: DC

## 2018-04-25 SURGICAL SUPPLY — 9 items
BNDG ADH 2 X3.75 FABRIC TAN LF (GAUZE/BANDAGES/DRESSINGS) ×3 IMPLANT
KIT TURNOVER KIT A (KITS) ×3 IMPLANT
NEEDLE HYPO 21X1.5 SAFETY (NEEDLE) IMPLANT
PAD ALCOHOL SWAB (MISCELLANEOUS) IMPLANT
SLING ARM LRG DEEP (SOFTGOODS) IMPLANT
SLING ARM M TX990204 (SOFTGOODS) IMPLANT
SLING ARM S TX990203 (SOFTGOODS) IMPLANT
STRAP BODY AND KNEE 60X3 (MISCELLANEOUS) ×3 IMPLANT
SYR 10ML LL (SYRINGE) IMPLANT

## 2018-04-25 NOTE — H&P (Signed)
Paper H&P to be scanned into permanent record. H&P reviewed and patient re-examined. No changes. Unfortunately, the patient did eat something this morning so her surgery has been postponed. It will be rescheduled in the near future.

## 2018-05-03 ENCOUNTER — Other Ambulatory Visit: Payer: Self-pay

## 2018-05-09 ENCOUNTER — Encounter
Admission: RE | Disposition: A | Discharge status: Home / Self Care | Payer: Self-pay | Source: Ambulatory Visit | Attending: Surgery

## 2018-05-09 ENCOUNTER — Ambulatory Visit: Payer: Medicare Other | Admitting: Anesthesiology

## 2018-05-09 ENCOUNTER — Ambulatory Visit
Admission: RE | Admit: 2018-05-09 | Discharge: 2018-05-09 | Disposition: A | Discharge status: Home / Self Care | Payer: Medicare Other | Source: Ambulatory Visit | Attending: Surgery | Admitting: Surgery

## 2018-05-09 DIAGNOSIS — M7502 Adhesive capsulitis of left shoulder: Secondary | ICD-10-CM | POA: Diagnosis not present

## 2018-05-09 DIAGNOSIS — I69311 Memory deficit following cerebral infarction: Secondary | ICD-10-CM | POA: Diagnosis not present

## 2018-05-09 DIAGNOSIS — E785 Hyperlipidemia, unspecified: Secondary | ICD-10-CM | POA: Diagnosis not present

## 2018-05-09 DIAGNOSIS — Z79899 Other long term (current) drug therapy: Secondary | ICD-10-CM | POA: Insufficient documentation

## 2018-05-09 DIAGNOSIS — I10 Essential (primary) hypertension: Secondary | ICD-10-CM | POA: Insufficient documentation

## 2018-05-09 DIAGNOSIS — Z7901 Long term (current) use of anticoagulants: Secondary | ICD-10-CM | POA: Diagnosis not present

## 2018-05-09 HISTORY — DX: Other shoulder lesions, unspecified shoulder: M75.80

## 2018-05-09 HISTORY — DX: Other enthesopathies, not elsewhere classified: M77.8

## 2018-05-09 HISTORY — PX: SHOULDER CLOSED REDUCTION: SHX1051

## 2018-05-09 HISTORY — DX: Personal history of other infectious and parasitic diseases: Z86.19

## 2018-05-09 HISTORY — DX: Polyp of colon: K63.5

## 2018-05-09 HISTORY — DX: Anxiety disorder, unspecified: F41.9

## 2018-05-09 SURGERY — MANIPULATION, JOINT, SHOULDER, WITH ANESTHESIA
Anesthesia: Regional | Site: Shoulder | Laterality: Left | Wound class: Clean Contaminated

## 2018-05-09 MED ORDER — FENTANYL CITRATE (PF) 100 MCG/2ML IJ SOLN
INTRAMUSCULAR | Status: DC | PRN
  Administered 2018-05-09: 100 ug via INTRAVENOUS

## 2018-05-09 MED ORDER — TRIAMCINOLONE ACETONIDE 40 MG/ML IJ SUSP
INTRAMUSCULAR | Status: DC | PRN
  Administered 2018-05-09: 1 mL via INTRAMUSCULAR

## 2018-05-09 MED ORDER — BUPIVACAINE-EPINEPHRINE 0.25% -1:200000 IJ SOLN
INTRAMUSCULAR | Status: DC | PRN
  Administered 2018-05-09: 9 mL

## 2018-05-09 MED ORDER — METOCLOPRAMIDE HCL 5 MG PO TABS: 5.0000 mg | ORAL_TABLET | Freq: Three times a day (TID) | ORAL | Status: DC | PRN

## 2018-05-09 MED ORDER — PROPOFOL 10 MG/ML IV BOLUS
INTRAVENOUS | Status: DC | PRN
  Administered 2018-05-09: 100 mg via INTRAVENOUS

## 2018-05-09 MED ORDER — POTASSIUM CHLORIDE IN NACL 20-0.9 MEQ/L-% IV SOLN: INTRAVENOUS | Status: DC

## 2018-05-09 MED ORDER — OXYCODONE HCL 5 MG PO TABS: 5.0000 mg | ORAL_TABLET | Freq: Once | ORAL | Status: DC | PRN

## 2018-05-09 MED ORDER — ONDANSETRON HCL 4 MG PO TABS: 4.0000 mg | ORAL_TABLET | Freq: Four times a day (QID) | ORAL | Status: DC | PRN

## 2018-05-09 MED ORDER — ACETAMINOPHEN 325 MG PO TABS: 325.0000 mg | ORAL_TABLET | ORAL | Status: DC | PRN

## 2018-05-09 MED ORDER — MIDAZOLAM HCL 5 MG/5ML IJ SOLN
INTRAMUSCULAR | Status: DC | PRN
  Administered 2018-05-09: 2 mg via INTRAVENOUS

## 2018-05-09 MED ORDER — LIDOCAINE HCL (CARDIAC) PF 100 MG/5ML IV SOSY
PREFILLED_SYRINGE | INTRAVENOUS | Status: DC | PRN
  Administered 2018-05-09: 30 mg via INTRAVENOUS

## 2018-05-09 MED ORDER — ONDANSETRON HCL 4 MG/2ML IJ SOLN: 4.0000 mg | Freq: Once | INTRAMUSCULAR | Status: DC | PRN

## 2018-05-09 MED ORDER — OXYCODONE HCL 5 MG/5ML PO SOLN: 5.0000 mg | Freq: Once | ORAL | Status: DC | PRN

## 2018-05-09 MED ORDER — FENTANYL CITRATE (PF) 100 MCG/2ML IJ SOLN: 25.0000 ug | INTRAMUSCULAR | Status: DC | PRN

## 2018-05-09 MED ORDER — TRAMADOL HCL 50 MG PO TABS: 50.0000 mg | ORAL_TABLET | Freq: Four times a day (QID) | ORAL | 0 refills | Status: AC | PRN

## 2018-05-09 MED ORDER — LACTATED RINGERS IV SOLN
10.0000 mL/h | INTRAVENOUS | Status: DC
  Administered 2018-05-09: 10 mL/h via INTRAVENOUS

## 2018-05-09 MED ORDER — METOCLOPRAMIDE HCL 5 MG/ML IJ SOLN: 5.0000 mg | Freq: Three times a day (TID) | INTRAMUSCULAR | Status: DC | PRN

## 2018-05-09 MED ORDER — ACETAMINOPHEN 160 MG/5ML PO SOLN: 325.0000 mg | ORAL | Status: DC | PRN

## 2018-05-09 MED ORDER — ONDANSETRON HCL 4 MG/2ML IJ SOLN: 4.0000 mg | Freq: Four times a day (QID) | INTRAMUSCULAR | Status: DC | PRN

## 2018-05-09 MED ORDER — ROPIVACAINE HCL 5 MG/ML IJ SOLN
INTRAMUSCULAR | Status: DC | PRN
  Administered 2018-05-09: 35 mL via PERINEURAL

## 2018-05-09 SURGICAL SUPPLY — 6 items
BNDG ADH 2 X3.75 FABRIC TAN LF (GAUZE/BANDAGES/DRESSINGS) ×3 IMPLANT
KIT TURNOVER KIT A (KITS) ×3 IMPLANT
NEEDLE HYPO 21X1.5 SAFETY (NEEDLE) ×3 IMPLANT
PAD ALCOHOL SWAB (MISCELLANEOUS) ×3 IMPLANT
STRAP BODY AND KNEE 60X3 (MISCELLANEOUS) ×3 IMPLANT
SYR 10ML LL (SYRINGE) ×3 IMPLANT

## 2018-05-09 NOTE — Discharge Instructions (Addendum)
General Anesthesia, Adult, Care After These instructions provide you with information about caring for yourself after your procedure. Your health care provider may also give you more specific instructions. Your treatment has been planned according to current medical practices, but problems sometimes occur. Call your health care provider if you have any problems or questions after your procedure. What can I expect after the procedure? After the procedure, it is common to have:  Vomiting.  A sore throat.  Mental slowness.  It is common to feel:  Nauseous.  Cold or shivery.  Sleepy.  Tired.  Sore or achy, even in parts of your body where you did not have surgery.  Follow these instructions at home: For at least 24 hours after the procedure:  Do not: ? Participate in activities where you could fall or become injured. ? Drive. ? Use heavy machinery. ? Drink alcohol. ? Take sleeping pills or medicines that cause drowsiness. ? Make important decisions or sign legal documents. ? Take care of children on your own.  Rest. Eating and drinking  If you vomit, drink water, juice, or soup when you can drink without vomiting.  Drink enough fluid to keep your urine clear or pale yellow.  Make sure you have little or no nausea before eating solid foods.  Follow the diet recommended by your health care provider. General instructions  Have a responsible adult stay with you until you are awake and alert.  Return to your normal activities as told by your health care provider. Ask your health care provider what activities are safe for you.  Take over-the-counter and prescription medicines only as told by your health care provider.  If you smoke, do not smoke without supervision.  Keep all follow-up visits as told by your health care provider. This is important. Contact a health care provider if:  You continue to have nausea or vomiting at home, and medicines are not helpful.  You  cannot drink fluids or start eating again.  You cannot urinate after 8-12 hours.  You develop a skin rash.  You have fever.  You have increasing redness at the site of your procedure. Get help right away if:  You have difficulty breathing.  You have chest pain.  You have unexpected bleeding.  You feel that you are having a life-threatening or urgent problem. This information is not intended to replace advice given to you by your health care provider. Make sure you discuss any questions you have with your health care provider. Document Released: 02/06/2001 Document Revised: 04/04/2016 Document Reviewed: 10/15/2015 Elsevier Interactive Patient Education  2018 [place] discharge instructions: May shower.  Apply ice frequently to shoulder. Resume Coumadin as prescribed. Take Tramadol as prescribed when needed.  May supplement with ES Tylenol if necessary. Keep shoulder sling on until block wears off, then discontinue using sling. Keep physical therapy appointment as scheduled for tomorrow. Follow-up in 10-14 days or as scheduled.

## 2018-05-09 NOTE — Transfer of Care (Signed)
Immediate Anesthesia Transfer of Care Note  Patient: Shannon Donovan  Procedure(s) Performed: CLOSED MANIPULATION SHOULDER  UNDER ANESTHESIA WITH STEROID INJECTION (Left Shoulder)  Patient Location: PACU  Anesthesia Type: Regional, General  Level of Consciousness: awake, alert  and patient cooperative  Airway and Oxygen Therapy: Patient Spontanous Breathing and Patient connected to supplemental oxygen  Post-op Assessment: Post-op Vital signs reviewed, Patient's Cardiovascular Status Stable, Respiratory Function Stable, Patent Airway and No signs of Nausea or vomiting  Post-op Vital Signs: Reviewed and stable  Complications: No apparent anesthesia complications

## 2018-05-09 NOTE — Anesthesia Procedure Notes (Addendum)
Anesthesia Regional Block: Interscalene brachial plexus block   Pre-Anesthetic Checklist: ,, timeout performed, Correct Patient, Correct Site, Correct Laterality, Correct Procedure, Correct Position, site marked, Risks and benefits discussed,  Surgical consent,  Pre-op evaluation,  At surgeon's request and post-op pain management  Laterality: Left  Prep: chloraprep       Needles:  Injection technique: Single-shot  Needle Type: Stimiplex     Needle Length: 5cm  Needle Gauge: 22     Additional Needles:   Procedures:,,,, ultrasound used (permanent image in chart),,,,  Narrative:  Start time: 05/09/2018 11:18 AM End time: 05/09/2018 11:22 AM Injection made incrementally with aspirations every 5 mL.  Performed by: Personally  Anesthesiologist: Veda Canning, MD

## 2018-05-09 NOTE — Anesthesia Preprocedure Evaluation (Addendum)
Anesthesia Evaluation  Patient identified by MRN, date of birth, ID band Patient awake    Reviewed: Allergy & Precautions, NPO status , Patient's Chart, lab work & pertinent test results  Airway Mallampati: II  TM Distance: >3 FB     Dental   Pulmonary neg pulmonary ROS, neg recent URI,    breath sounds clear to auscultation       Cardiovascular hypertension, + dysrhythmias (atrial fibrillation)  Rhythm:Regular Rate:Normal  HLD   Neuro/Psych Anxiety CVA (8887 - embolic, short term memory loss)    GI/Hepatic negative GI ROS, neg GERD  ,  Endo/Other  negative endocrine ROS  Renal/GU      Musculoskeletal   Abdominal   Peds  Hematology negative hematology ROS (+)   Anesthesia Other Findings   Reproductive/Obstetrics                            Anesthesia Physical Anesthesia Plan  ASA: III  Anesthesia Plan: General and Regional   Post-op Pain Management:  Regional for Post-op pain   Induction: Intravenous  PONV Risk Score and Plan:   Airway Management Planned: Natural Airway  Additional Equipment:   Intra-op Plan:   Post-operative Plan:   Informed Consent: I have reviewed the patients History and Physical, chart, labs and discussed the procedure including the risks, benefits and alternatives for the proposed anesthesia with the patient or authorized representative who has indicated his/her understanding and acceptance.     Plan Discussed with: CRNA  Anesthesia Plan Comments:       Anesthesia Quick Evaluation

## 2018-05-09 NOTE — Progress Notes (Signed)
Assisted Shannon Donovan, ANMD with left, ultrasound guided, supraclavicular block. Side rails up, monitors on throughout procedure. See vital signs in flow sheet. Tolerated Procedure well.

## 2018-05-09 NOTE — H&P (Signed)
Paper H&P to be scanned into permanent record. H&P reviewed and patient re-examined. No changes. 

## 2018-05-09 NOTE — Op Note (Signed)
05/09/2018  12:11 PM  Patient:   Shannon Donovan  Pre-Op Diagnosis:   Secondary adhesive capsulitis, left shoulder.  Post-Op Diagnosis:   Same  Procedure:   Manipulation under anesthesia with steroid injection, left shoulder.  Surgeon:   Pascal Lux, MD  Assistant:   None  Anesthesia:   IV sedation with interscalene block placed preoperatively by anesthesiologist.  Findings:   As above. Prior to manipulation, the left shoulder could be forward flexed to 120 and abducted to 110. At 90 of abduction, the shoulder could be externally rotated to 70 and internally rotated to 50. Following manipulation, the shoulder could be forward flexed to 170, abducted to 165 and, at 90 of abduction, externally rotated to 95 and internally rotated to 70.  Complications:   None  EBL:   0 cc  Fluids:   300 cc crystalloid  TT:   None  Drains:   None  Closure:   None  Brief Clinical Note:   The patient is a 67 year old female who is now 3 months status post a left shoulder arthroscopy with debridement, arthroscopic subscapularis tendon repair, arthroscopic subacromial decompression, mini open repair of a partial-thickness supraspinatus tendon tear with a [employer] Regeneten patch, and biceps tenolysis. Despite extensive physical therapy, the patient continues to have difficulty regaining shoulder range of motion. The patient's history and examination are consistent with adhesive capsulitis. The patient presents at this time for a manipulation under anesthesia with steroid injection of the left shoulder.  Procedure:   The patient underwent placement of an interscalene block in the preoperative holding area before being brought into the operating room and lain in the supine position. After adequate IV sedation was achieved, a timeout was performed to verify the correct surgical site. The left shoulder was gently manipulated in both abduction and external rotation, as well as adduction  and internal rotation. Several palpable and audible pops were heard as the scar tissue released, permitting full range of motion of the shoulder. The glenohumeral joint was injected sterilely using 1 cc of Kenalog-40 and 9 cc of 0.25% Sensorcaine with epinephrine before the patient was placed into a sling. The patient was then awakened and returned to the recovery room in satisfactory condition after tolerating the procedure well.

## 2018-05-09 NOTE — Anesthesia Postprocedure Evaluation (Signed)
Anesthesia Post Note  Patient: Shannon Donovan  Procedure(s) Performed: CLOSED MANIPULATION SHOULDER  UNDER ANESTHESIA WITH STEROID INJECTION (Left Shoulder)  Patient location during evaluation: PACU Anesthesia Type: Regional Level of consciousness: awake Pain management: pain level controlled Vital Signs Assessment: post-procedure vital signs reviewed and stable Respiratory status: respiratory function stable Cardiovascular status: stable Postop Assessment: no signs of nausea or vomiting Anesthetic complications: no    Veda Canning

## 2018-05-10 ENCOUNTER — Encounter: Payer: Self-pay | Admitting: Surgery

## 2018-11-19 ENCOUNTER — Other Ambulatory Visit: Payer: Self-pay | Admitting: Internal Medicine

## 2018-11-19 DIAGNOSIS — Z1231 Encounter for screening mammogram for malignant neoplasm of breast: Secondary | ICD-10-CM

## 2018-12-26 ENCOUNTER — Ambulatory Visit
Admission: RE | Admit: 2018-12-26 | Discharge: 2018-12-26 | Disposition: A | Discharge status: Home / Self Care | Payer: Medicare Other | Source: Ambulatory Visit | Attending: Internal Medicine | Admitting: Internal Medicine

## 2018-12-26 DIAGNOSIS — Z1231 Encounter for screening mammogram for malignant neoplasm of breast: Secondary | ICD-10-CM | POA: Diagnosis not present

## 2019-11-19 ENCOUNTER — Other Ambulatory Visit: Payer: Self-pay | Admitting: Internal Medicine

## 2019-11-19 DIAGNOSIS — Z1231 Encounter for screening mammogram for malignant neoplasm of breast: Secondary | ICD-10-CM

## 2019-12-31 ENCOUNTER — Ambulatory Visit
Admission: RE | Admit: 2019-12-31 | Discharge: 2019-12-31 | Disposition: A | Discharge status: Home / Self Care | Payer: Medicare Other | Source: Ambulatory Visit | Attending: Internal Medicine | Admitting: Internal Medicine

## 2019-12-31 DIAGNOSIS — Z1231 Encounter for screening mammogram for malignant neoplasm of breast: Secondary | ICD-10-CM | POA: Diagnosis not present

## 2020-01-08 IMAGING — MR MR SHOULDER*L* W/O CM
5 series · 40 of 40 positions shown · non-contrast
Comparison: None.

CLINICAL DATA: Shoulder pain for 1 year with decreased range of
motion.

EXAM:
MRI OF THE LEFT SHOULDER WITHOUT CONTRAST
TECHNIQUE: Multiplanar, multisequence MR imaging of the shoulder was performed.
No intravenous contrast was administered.

[Series 3: T2 fat-sat · axial · 4.0mm · 0.47mm/px · z∈[-23,+65]mm · 8 of 21 slices shown (1 of 3)]
[im 1/21]
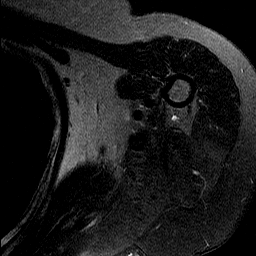
[im 3/21]
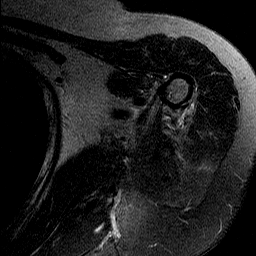
[im 6/21]
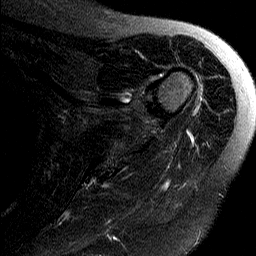
[im 9/21]
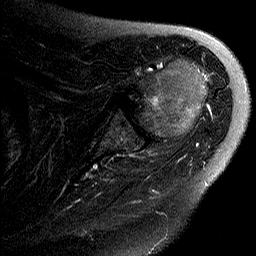
[im 12/21]
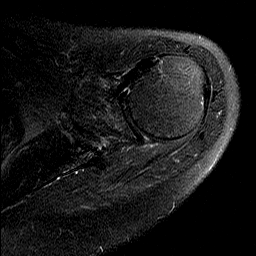
[im 15/21]
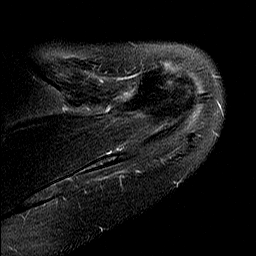
[im 18/21]
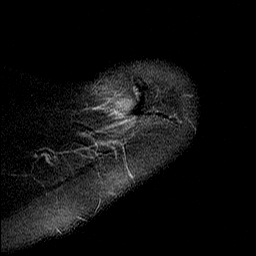
[im 21/21]
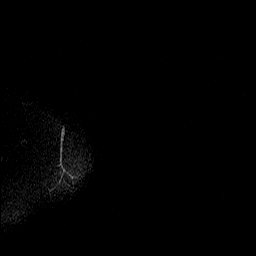

[Series 4: T1 · oblique · 4.0mm · 0.62mm/px · 8 of 21 slices shown]
[im 1/21]
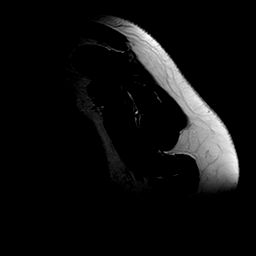
[im 3/21]
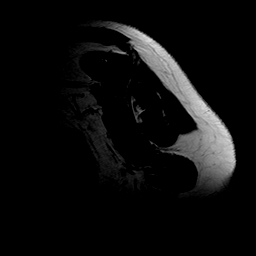
[im 6/21]
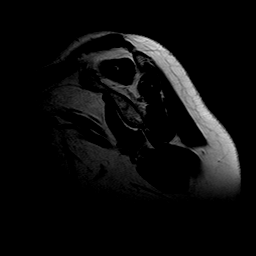
[im 9/21]
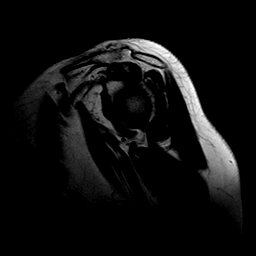
[im 12/21]
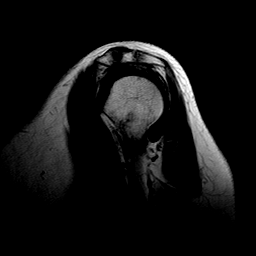
[im 15/21]
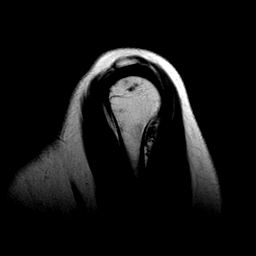
[im 18/21]
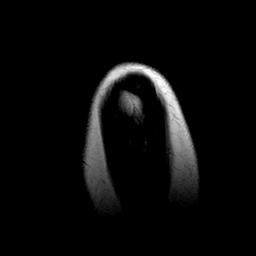
[im 21/21]
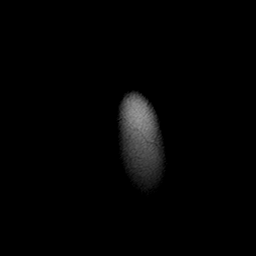

[Series 5: T2 fat-sat · oblique · 4.0mm · 0.62mm/px · 8 of 21 slices shown (2 of 3)]
[im 1/21]
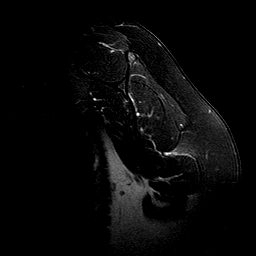
[im 3/21]
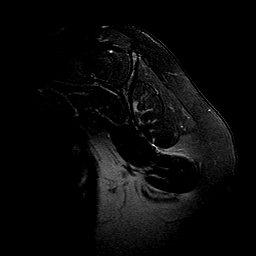
[im 6/21]
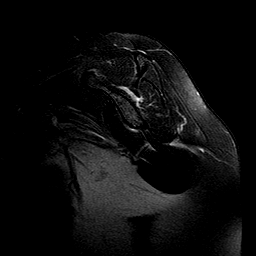
[im 9/21]
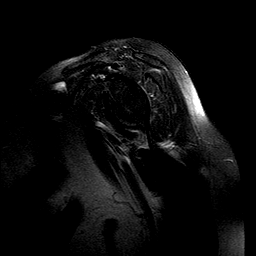
[im 12/21]
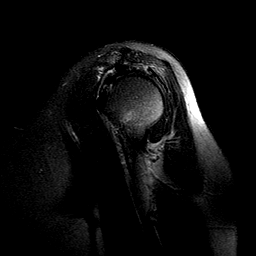
[im 15/21]
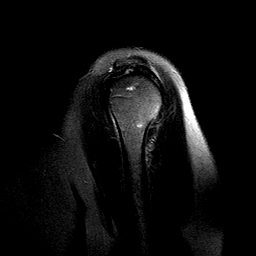
[im 18/21]
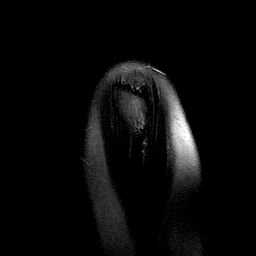
[im 21/21]
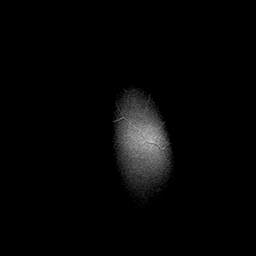

[Series 6: T2 fat-sat · oblique · 4.0mm · 0.62mm/px · 8 of 19 slices shown (3 of 3)]
[im 1/19]
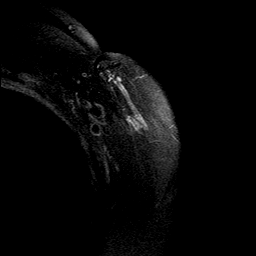
[im 3/19]
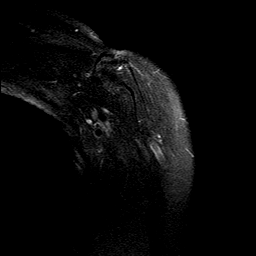
[im 6/19]
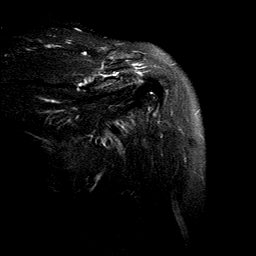
[im 8/19]
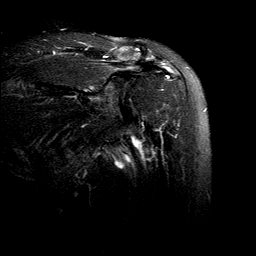
[im 11/19]
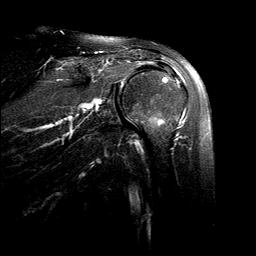
[im 13/19]
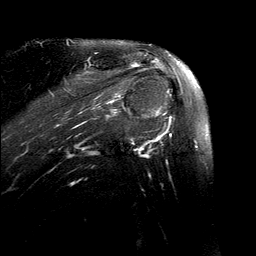
[im 16/19]
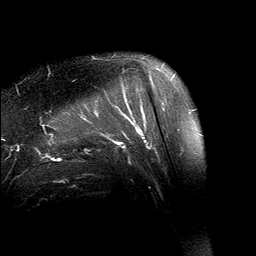
[im 19/19]
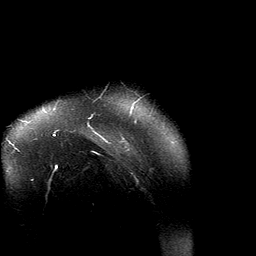

[Series 7: PD · oblique · 4.0mm · 0.62mm/px · 8 of 19 slices shown]
[im 1/19]
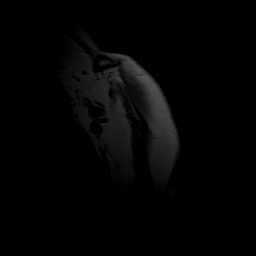
[im 3/19]
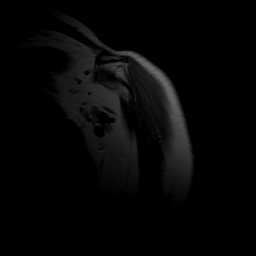
[im 6/19]
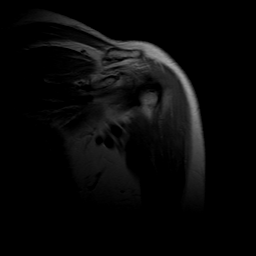
[im 8/19]
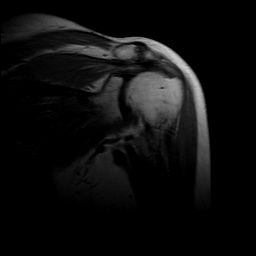
[im 11/19]
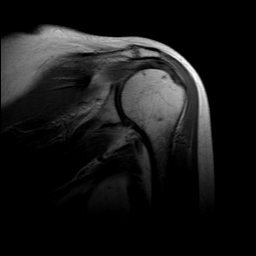
[im 13/19]
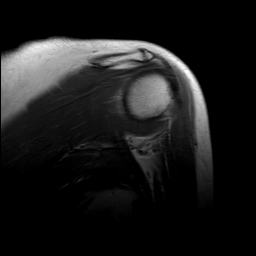
[im 16/19]
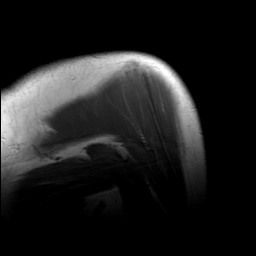
[im 19/19]
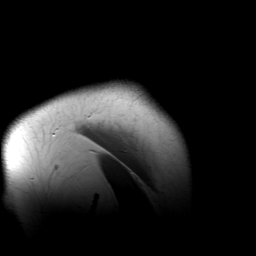

[40 of 40 positions shown; findings below may reference images not displayed]

FINDINGS: Rotator cuff: Significant rotator cuff tendinopathy/tendinosis with
interstitial tears and shallow articular surface tears. No
full-thickness retracted tear.

Muscles:  Normal

Biceps long head:  Intact

Acromioclavicular Joint: No significant degenerative changes. Mild
edema in the distal clavicle. The acromion is type 2-3 in shape. Os
acromiale noted

Glenohumeral Joint: Minimal/mild degenerative changes. No joint
effusion or synovitis.

Labrum:  No obvious labral tears.

Bones:  No acute bony findings.

Other: Mild subacromial/subdeltoid bursitis.
IMPRESSION: 1. Significant rotator cuff tendinopathy/tendinosis most
significantly involving the supraspinatus tendon. Shallow articular
surface tears but no full-thickness retracted tear.
2. Intact long head biceps tendon and glenoid labrum.
3. Type 2-3 acromion and os acromiale possibly contributing to
impingement.
4. Mild subacromial/subdeltoid bursitis.

## 2020-04-16 ENCOUNTER — Encounter: Payer: Self-pay | Admitting: Physical Therapy

## 2020-04-16 ENCOUNTER — Other Ambulatory Visit: Payer: Self-pay

## 2020-04-16 ENCOUNTER — Ambulatory Visit: Payer: Medicare Other | Attending: Neurology | Admitting: Physical Therapy

## 2020-04-16 DIAGNOSIS — M25612 Stiffness of left shoulder, not elsewhere classified: Secondary | ICD-10-CM | POA: Insufficient documentation

## 2020-04-16 DIAGNOSIS — M6281 Muscle weakness (generalized): Secondary | ICD-10-CM | POA: Diagnosis present

## 2020-04-16 DIAGNOSIS — M2560 Stiffness of unspecified joint, not elsewhere classified: Secondary | ICD-10-CM | POA: Diagnosis present

## 2020-04-16 NOTE — Therapy (Signed)
[place] MAIN [facility] SERVICES [address] [place], [place], [address] Phone: [phone]   Fax:  [phone]  Physical Therapy Evaluation  Patient Details  Name: Shannon Donovan MRN: [id number] Date of Birth: 24-Dec-1950 Referring Provider (PT): Jennings Books   Encounter Date: 04/16/2020  PT End of Session - 04/16/20 1147    Visit Number  1    Number of Visits  17    Date for PT Re-Evaluation  06/11/20    PT Start Time  1105    PT Stop Time  1200    PT Time Calculation (min)  55 min    Equipment Utilized During Treatment  Gait belt    Activity Tolerance  Patient tolerated treatment well;Patient limited by pain    Behavior During Therapy  WFL for tasks assessed/performed       Past Medical History:  Diagnosis Date  . Adolescent scoliosis   . Anxiety   . Atrial fibrillation ([place])   . Dysrhythmia    A fib  . History of chicken pox   . Hyperlipidemia   . Hyperplastic colon polyp 09/05/2016   Tubular adenoma of colon  . Hypertension    benign/ controlled  . Short-term memory loss   . Shoulder tendinitis    LEFT upper biceps tendonitis, rotator cuff tendinitis  . 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 BACK SURGERY     x2 at age 51 and 25 for scoliosis  . COLONOSCOPY N/A 09/05/2016   Procedure: COLONOSCOPY;  Surgeon: Lollie Sails, MD;  Location: [facility] ENDOSCOPY;  Service: Endoscopy;  Laterality: N/A;  Pt on Coumadin (PT/ INR)  . SHOULDER ARTHROSCOPY WITH OPEN ROTATOR CUFF REPAIR Left 02/08/2018   Procedure: SHOULDER ARTHROSCOPY WITH OPEN ROTATOR CUFF REPAIR;  Surgeon: Corky Mull, MD;  Location: [facility] ORS;  Service: Orthopedics;  Laterality: Left;  . SHOULDER CLOSED REDUCTION Left 05/09/2018   Procedure: CLOSED MANIPULATION SHOULDER  UNDER ANESTHESIA WITH STEROID INJECTION;  Surgeon: Corky Mull, MD;  Location: [place];  Service:  Orthopedics;  Laterality: Left;  . TONSILLECTOMY      There were no vitals filed for this visit.   Subjective Assessment - 04/16/20 1117    Subjective  Patient is having left elbow stiffness and contracture. This began 6 months ago.    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 She is having diffiutly with dressing and bathing with some effort. She is having pain intermittent during activiites.She had rotator cuff surgery L shoulder 2 years ago. She had a second surgery 2 months after the first surgery. She had PT following the left shoulder surgery.02/08/18, then folowed up with manipulation 6/19.    Limitations  House hold activities    Patient Stated Goals  to be able to use her left UE for functional activities    Currently in Pain?  Yes    Pain Score  8     Pain Location  Shoulder    Pain Orientation  Left    Pain Descriptors / Indicators  Aching    Pain Type  Chronic pain         [facility] PT Assessment - 04/16/20 1113      Assessment   Medical Diagnosis  left elbow contracture  Referring Provider (PT)  shah, Hemang    Onset Date/Surgical Date  03/18/20    Hand Dominance  Right    Prior Therapy  [facility]      Precautions   Precautions  None      Restrictions   Weight Bearing Restrictions  No      Home Environment   Living Environment  Private residence    Living Arrangements  Alone    Available Help at Discharge  Personal care attendant    Type of [place] to enter    Entrance Stairs-Number of Steps  1    Entrance Stairs-Rails  Right    Home Layout  One level    [place] seat;Toilet riser;Grab bars - toilet;Grab bars - tub/shower;Hand held shower head      Prior Function   Level of Independence  Independent with household mobility without device    Vocation  On disability    Leisure  TV, read books      Cognition   Overall Cognitive  Status  Within Functional Limits for tasks assessed              PAIN: 8/10 left shoulder and elbow   POSTURE: WFL   PROM/AROM: PROM  60 deg left elbow contracture extension PROM left Shoulder 70 deg flex PROM abd 70 degs Supination 15 deg left elbow Pronation WFL  STRENGTH:  Graded on a 0-5 scale Muscle Group Left Right  Shoulder flex -2/5 4/5  Shoulder Abd -2/5 4/5  Shoulder Ext -2/5 4/5  Shoulder IR/ER -2/5 4/5  Elbow -2/5 4/5  Wrist/hand 1/5 4/5                                       SENSATION:WNL   FUNCTIONAL MOBILITY: I with bed mobility and transfers   BALANCE:.WNL standing dynamic and static    GAIT: slow gait speed without AD  OUTCOME MEASURES: TEST Outcome Interpretation  Quick dash 84% 0 % is not impaired                           Objective measurements completed on examination: See above findings.              PT Education - 04/16/20 1110    Education Details  plan of care    Person(s) Educated  Patient    Methods  Explanation    Comprehension  Verbalized understanding       PT Short Term Goals - 04/16/20 1335      PT SHORT TERM GOAL #1   Title  Patient will be independent in home exercise program to improve strength/mobility for better functional independence with ADLs.    Time  4    Period  Weeks    Status  New    Target Date  05/14/20      PT SHORT TERM GOAL #2   Title  Patient will be able to perform household work/ chores without increase in symptoms.    Time  4    Period  Weeks    Status  New        PT Long Term Goals - 04/16/20 1336      PT LONG TERM GOAL #1   Title  Patient will report a worst pain of 3/10 on VAS  in left shoulder and elbow  to improve tolerance with ADLs and reduced symptoms with activities.    Period  Weeks    Status  New    Target Date  06/11/20      PT LONG TERM GOAL #2   Title  Patient will demonstrate adequate shoulder ROM and strength to be able to shave and dress  independently with pain less than 3/10.    Time  8    Period  Weeks    Status  New    Target Date  06/11/20      PT LONG TERM GOAL #3   Title  Patient will decrease Quick DASH score by > 8 points demonstrating reduced self-reported upper extremity disability.    Time  8    Status  New    Target Date  06/11/20           Patient presents to PT evaluation with reports of LUE contracture and weakness . She has decreased L shoulder ROM and L elbow ROM including flex contracture 60 deg and decreased supination L elbow. She is not able to perform ADLs as easily and they take longer in duration to complete. She has pain in her left shoulder and elbow with activity. She will benefit from skilled PT to improve mobility and ADL's, ROM and strength.   Plan - 04/16/20 1111    Clinical Impression Statement  Patient presents to PT evaluation with reports of LUE contracture and weakness . She has decreased L shoulder ROM and L elbow ROM including flex contracture 60 deg and decreased supination L elbow. She is not able to perform ADLs as easily and they take longer in duration to complete. She has pain in her left shoulder and elbow with activity. She will benefit from skilled PT to improve mobility and ADL's, ROM and strength.    Personal Factors and Comorbidities  Age;Comorbidity 1    Examination-Activity Limitations  Bathing;Dressing;Carry;Reach Overhead;Sleep    Examination-Participation Restrictions  Cleaning;Meal Prep;Community Activity;Yard Work    Merchant [employer] officer  Stable/Uncomplicated    Designer, jewellery  Low    Rehab Potential  Fair    PT Frequency  2x / week    PT Duration  8 weeks    PT Treatment/Interventions  Electrical Stimulation;Cryotherapy;ADLs/Self Care Home Management;Moist Heat;Ultrasound;Gait training;Therapeutic activities;Therapeutic exercise;Patient/family education;Manual techniques;Passive range of motion;Dry needling;Splinting;Taping;Joint  Manipulations    PT Next Visit Plan  manual therapy    Consulted and Agree with Plan of Care  Patient       Patient will benefit from skilled therapeutic intervention in order to improve the following deficits and impairments:  Decreased activity tolerance, Decreased endurance, Decreased range of motion, Decreased strength, Impaired flexibility, Impaired tone, Pain, Impaired UE functional use  Visit Diagnosis: Limited joint range of motion (ROM)  Muscle weakness (generalized)  Stiffness of left shoulder, not elsewhere classified     Problem List Patient Active Problem List   Diagnosis Date Noted  . Left rotator cuff tear 02/08/2018    Alanson Puls, [place] DPT 04/16/2020, 1:39 PM  [place] MAIN [facility] SERVICES [address] [place], [place], [address] Phone: [phone]   Fax:  [phone]  Name: Shannon Donovan MRN: [id number] Date of Birth: [birth year]

## 2020-04-20 ENCOUNTER — Other Ambulatory Visit: Payer: Self-pay

## 2020-04-20 ENCOUNTER — Encounter: Payer: Self-pay | Admitting: Physical Therapy

## 2020-04-20 ENCOUNTER — Ambulatory Visit: Payer: Medicare Other | Admitting: Physical Therapy

## 2020-04-20 DIAGNOSIS — M2560 Stiffness of unspecified joint, not elsewhere classified: Secondary | ICD-10-CM | POA: Diagnosis not present

## 2020-04-20 DIAGNOSIS — M6281 Muscle weakness (generalized): Secondary | ICD-10-CM

## 2020-04-20 DIAGNOSIS — M25612 Stiffness of left shoulder, not elsewhere classified: Secondary | ICD-10-CM

## 2020-04-20 NOTE — Therapy (Signed)
[place] MAIN [facility] SERVICES [address] [place], [place], [address] Phone: [phone]   Fax:  [phone]  Physical Therapy Treatment  Patient Details  Name: Shannon Donovan MRN: [id number] Date of Birth: Oct 09, 1951 Referring Provider (PT): Jennings Books   Encounter Date: 04/20/2020  PT End of Session - 04/20/20 0816    Visit Number  2    Number of Visits  17    Date for PT Re-Evaluation  06/11/20    PT Start Time  0810    PT Stop Time  0848    PT Time Calculation (min)  38 min    Equipment Utilized During Treatment  Gait belt    Activity Tolerance  Patient tolerated treatment well;Patient limited by pain    Behavior During Therapy  [facility] for tasks assessed/performed       Past Medical History:  Diagnosis Date  . Adolescent scoliosis   . Anxiety   . Atrial fibrillation ([place])   . Dysrhythmia    A fib  . History of chicken pox   . Hyperlipidemia   . Hyperplastic colon polyp 09/05/2016   Tubular adenoma of colon  . Hypertension    benign/ controlled  . Short-term memory loss   . Shoulder tendinitis    LEFT upper biceps tendonitis, rotator cuff tendinitis  . Stroke ([place]) 13/2440   embolic CVA/ short term memory loss    Past Surgical History:  Procedure Laterality Date  . BACK SURGERY     x2 at age 35 and 16 for scoliosis  . COLONOSCOPY N/A 09/05/2016   Procedure: COLONOSCOPY;  Surgeon: Lollie Sails, MD;  Location: [facility] ENDOSCOPY;  Service: Endoscopy;  Laterality: N/A;  Pt on Coumadin (PT/ INR)  . SHOULDER ARTHROSCOPY WITH OPEN ROTATOR CUFF REPAIR Left 02/08/2018   Procedure: SHOULDER ARTHROSCOPY WITH OPEN ROTATOR CUFF REPAIR;  Surgeon: Corky Mull, MD;  Location: [facility] ORS;  Service: Orthopedics;  Laterality: Left;  . SHOULDER CLOSED REDUCTION Left 05/09/2018   Procedure: CLOSED MANIPULATION SHOULDER  UNDER ANESTHESIA WITH STEROID INJECTION;  Surgeon: Corky Mull, MD;  Location: [place];  Service:  Orthopedics;  Laterality: Left;  . TONSILLECTOMY      There were no vitals filed for this visit.  Subjective Assessment - 04/20/20 0808    Subjective  Patient is having left elbow stiffness and contracture. This began 6 months ago.    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 She is having diffiutly with dressing and bathing with some effort. She is having pain intermittent during activiites.She had rotator cuff surgery L shoulder 2 years ago. She had a second surgery 2 months after the first surgery. She had PT following the left shoulder surgery.02/08/18, then folowed up with manipulation 6/19.    Limitations  House hold activities    Patient Stated Goals  to be able to use her left UE for functional activities    Pain Score  7     Pain Location  Shoulder    Pain Orientation  Left    Pain Descriptors / Indicators  Aching         Treatment: PROM to left shoulder x 10  Scapula mobilization L in sidelying ant/post, medial / lateral x 10 each direction Soft tissue mobility to left scapula musculature Elbow extension PROM and elbow supination PROM x 30 x  3  Supine bench press with 1 lb rod BUE shoulder flex towards ceiling  STM to scapula musculature in prone with tenderness to light touch Patient performed with instruction, verbal cues, tactile cues of therapist: goal: increase tissue extensibility, promote proper posture, improve mobility                        PT Education - 04/20/20 0814    Education Details  HEP    Person(s) Educated  Patient    Methods  Explanation    Comprehension  Verbalized understanding       PT Short Term Goals - 04/16/20 1335      PT SHORT TERM GOAL #1   Title  Patient will be independent in home exercise program to improve strength/mobility for better functional independence with ADLs.    Time  4    Period  Weeks    Status  New     Target Date  05/14/20      PT SHORT TERM GOAL #2   Title  Patient will be able to perform household work/ chores without increase in symptoms.    Time  4    Period  Weeks    Status  New        PT Long Term Goals - 04/16/20 1336      PT LONG TERM GOAL #1   Title  Patient will report a worst pain of 3/10 on VAS in left shoulder and elbow  to improve tolerance with ADLs and reduced symptoms with activities.    Period  Weeks    Status  New    Target Date  06/11/20      PT LONG TERM GOAL #2   Title  Patient will demonstrate adequate shoulder ROM and strength to be able to shave and dress independently with pain less than 3/10.    Time  8    Period  Weeks    Status  New    Target Date  06/11/20      PT LONG TERM GOAL #3   Title  Patient will decrease Quick DASH score by > 8 points demonstrating reduced self-reported upper extremity disability.    Time  8    Status  New    Target Date  06/11/20            Plan - 04/20/20 0817    Clinical Impression Statement   Pt was able to perform all exercises today PROM to shoulder and elbow. Manulat therapy to scapula and elbow musculature.  Pt requires verbal, visual and tactile cues during exercise in order to complete tasks with proper form and technique.  Pt would continue to benefit from skilled PT services in order to further strengthen LUE and improve ROM and decrease shoulder pain.    Personal Factors and Comorbidities  Age;Comorbidity 1    Examination-Activity Limitations  Bathing;Dressing;Carry;Reach Overhead;Sleep    Examination-Participation Restrictions  Cleaning;Meal Prep;Community Activity;Yard Work    Stability/Clinical Decision Making  Stable/Uncomplicated    Rehab Potential  Fair    PT Frequency  2x / week    PT Duration  8 weeks    PT Treatment/Interventions  Electrical Stimulation;Cryotherapy;ADLs/Self Care Home Management;Moist Heat;Ultrasound;Gait training;Therapeutic activities;Therapeutic exercise;Patient/family  education;Manual techniques;Passive range of motion;Dry needling;Splinting;Taping;Joint Manipulations    PT Next Visit Plan  manual therapy    Consulted and Agree with Plan of Care  Patient       Patient will benefit from skilled therapeutic  intervention in order to improve the following deficits and impairments:  Decreased activity tolerance, Decreased endurance, Decreased range of motion, Decreased strength, Impaired flexibility, Impaired tone, Pain, Impaired UE functional use  Visit Diagnosis: Limited joint range of motion (ROM)  Muscle weakness (generalized)  Stiffness of left shoulder, not elsewhere classified     Problem List Patient Active Problem List   Diagnosis Date Noted  . Left rotator cuff tear 02/08/2018    Alanson Puls, [place] DPT 04/20/2020, 8:17 AM  [place] MAIN [facility] SERVICES [address] [place], [place], [address] Phone: [phone]   Fax:  [phone]  Name: Juliah Scadden MRN: [id number] Date of Birth: [birth year]

## 2020-04-22 ENCOUNTER — Other Ambulatory Visit: Payer: Self-pay

## 2020-04-22 ENCOUNTER — Encounter: Payer: Self-pay | Admitting: Physical Therapy

## 2020-04-22 ENCOUNTER — Ambulatory Visit: Payer: Medicare Other | Admitting: Physical Therapy

## 2020-04-22 DIAGNOSIS — M6281 Muscle weakness (generalized): Secondary | ICD-10-CM

## 2020-04-22 DIAGNOSIS — M2560 Stiffness of unspecified joint, not elsewhere classified: Secondary | ICD-10-CM | POA: Diagnosis not present

## 2020-04-22 DIAGNOSIS — M25612 Stiffness of left shoulder, not elsewhere classified: Secondary | ICD-10-CM

## 2020-04-22 NOTE — Therapy (Signed)
[place] MAIN [facility] SERVICES [address] [place], [place], [address] Phone: [phone]   Fax:  [phone]  Physical Therapy Treatment  Patient Details  Name: Shannon Donovan MRN: [id number] Date of Birth: [birth year] Referring Provider (PT): Jennings Books   Encounter Date: 04/22/2020  PT End of Session - 04/22/20 1119    Visit Number  3    Number of Visits  17    Date for PT Re-Evaluation  06/11/20    PT Start Time  1105    PT Stop Time  1145    PT Time Calculation (min)  40 min    Equipment Utilized During Treatment  Gait belt    Activity Tolerance  Patient tolerated treatment well;Patient limited by pain    Behavior During Therapy  WFL for tasks assessed/performed       Past Medical History:  Diagnosis Date  . Adolescent scoliosis   . Anxiety   . Atrial fibrillation ([place])   . Dysrhythmia    A fib  . History of chicken pox   . Hyperlipidemia   . Hyperplastic colon polyp 09/05/2016   Tubular adenoma of colon  . Hypertension    benign/ controlled  . Short-term memory loss   . Shoulder tendinitis    LEFT upper biceps tendonitis, rotator cuff tendinitis  . 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 BACK SURGERY     x2 at age 69 and 89 for scoliosis  . COLONOSCOPY N/A 09/05/2016   Procedure: COLONOSCOPY;  Surgeon: Lollie Sails, MD;  Location: [facility] ENDOSCOPY;  Service: Endoscopy;  Laterality: N/A;  Pt on Coumadin (PT/ INR)  . SHOULDER ARTHROSCOPY WITH OPEN ROTATOR CUFF REPAIR Left 02/08/2018   Procedure: SHOULDER ARTHROSCOPY WITH OPEN ROTATOR CUFF REPAIR;  Surgeon: Corky Mull, MD;  Location: [facility] ORS;  Service: Orthopedics;  Laterality: Left;  . SHOULDER CLOSED REDUCTION Left 05/09/2018   Procedure: CLOSED MANIPULATION SHOULDER  UNDER ANESTHESIA WITH STEROID INJECTION;  Surgeon: Corky Mull, MD;  Location: [place];  Service:  Orthopedics;  Laterality: Left;  . TONSILLECTOMY      There were no vitals filed for this visit.  Subjective Assessment - 04/22/20 1118    Subjective  Patient is having left elbow stiffness and contracture. This began 6 months ago.Her pain level is 3/10.    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 She is having diffiutly with dressing and bathing with some effort. She is having pain intermittent during activiites.She had rotator cuff surgery L shoulder 2 years ago. She had a second surgery 2 months after the first surgery. She had PT following the left shoulder surgery.02/08/18, then folowed up with manipulation 6/19.    Limitations  House hold activities    Patient Stated Goals  to be able to use her left UE for functional activities    Currently in Pain?  Yes    Pain Score  3     Pain Location  Shoulder    Pain Orientation  Left    Pain Descriptors / Indicators  Aching    Pain Type  Chronic pain       Treatment: MH to L shoulder during exercises: PROM to left shoulder flex / abd/ ER/ IR, L elbow ext and  supination and pronation x 10  Scapula mobilization L in sidelying ant/post, medial / lateral x 10 each direction Soft tissue mobility to left scapula musculature Elbow extension PROM and elbow supination PROM x 30 x 3  Supine bench press with 1 lb rod BUE shoulder flex towards ceiling  STM to scapula musculature in prone with tenderness to light touch Patient performed with instruction, verbal cues, tactile cues of therapist: goal:increase tissue extensibility, promote proper posture, improve mobility                         PT Education - 04/22/20 1119    Education Details  HEP    Person(s) Educated  Patient    Methods  Explanation    Comprehension  Returned demonstration;Tactile cues required;Need further instruction       PT Short Term Goals - 04/16/20 1335       PT SHORT TERM GOAL #1   Title  Patient will be independent in home exercise program to improve strength/mobility for better functional independence with ADLs.    Time  4    Period  Weeks    Status  New    Target Date  05/14/20      PT SHORT TERM GOAL #2   Title  Patient will be able to perform household work/ chores without increase in symptoms.    Time  4    Period  Weeks    Status  New        PT Long Term Goals - 04/16/20 1336      PT LONG TERM GOAL #1   Title  Patient will report a worst pain of 3/10 on VAS in left shoulder and elbow  to improve tolerance with ADLs and reduced symptoms with activities.    Period  Weeks    Status  New    Target Date  06/11/20      PT LONG TERM GOAL #2   Title  Patient will demonstrate adequate shoulder ROM and strength to be able to shave and dress independently with pain less than 3/10.    Time  8    Period  Weeks    Status  New    Target Date  06/11/20      PT LONG TERM GOAL #3   Title  Patient will decrease Quick DASH score by > 8 points demonstrating reduced self-reported upper extremity disability.    Time  8    Status  New    Target Date  06/11/20            Plan - 04/22/20 1120    Clinical Impression Statement Patient has decreased left shoulder and elbow ROM and has painful shoulder and elbow causing decreased mobility. She tolerated PROM to left shoulder flex/abd/ER/ IR ,  and elbow supination/pronation and extension. She has increased pain with left UE during PROM but pain decreases immediately following stretch. Pateint will continue to benefit from skilled PT to improve ROM and strength and decrease L shoulder and elbow pain.    Personal Factors and Comorbidities  Age;Comorbidity 1    Examination-Activity Limitations  Bathing;Dressing;Carry;Reach Overhead;Sleep    Examination-Participation Restrictions  Cleaning;Meal Prep;Community Activity;Yard Work    Stability/Clinical Decision Making  Stable/Uncomplicated     Rehab Potential  Fair    PT Frequency  2x / week    PT Duration  8 weeks    PT Treatment/Interventions  Electrical Stimulation;Cryotherapy;ADLs/Self Care Home Management;Moist Heat;Ultrasound;Gait training;Therapeutic activities;Therapeutic  exercise;Patient/family education;Manual techniques;Passive range of motion;Dry needling;Splinting;Taping;Joint Manipulations    PT Next Visit Plan  manual therapy    Consulted and Agree with Plan of Care  Patient       Patient will benefit from skilled therapeutic intervention in order to improve the following deficits and impairments:  Decreased activity tolerance, Decreased endurance, Decreased range of motion, Decreased strength, Impaired flexibility, Impaired tone, Pain, Impaired UE functional use  Visit Diagnosis: Limited joint range of motion (ROM)  Muscle weakness (generalized)  Stiffness of left shoulder, not elsewhere classified     Problem List Patient Active Problem List   Diagnosis Date Noted  . Left rotator cuff tear 02/08/2018    Alanson Puls, [place] DPT 04/22/2020, 12:05 PM  [place] MAIN [facility] SERVICES [address] [place], [place], [address] Phone: [phone]   Fax:  [phone]  Name: Davida Falconi MRN: [id number] Date of Birth: November 08, 1951

## 2020-04-27 ENCOUNTER — Ambulatory Visit: Payer: Medicare Other | Admitting: Physical Therapy

## 2020-04-27 ENCOUNTER — Encounter: Payer: Self-pay | Admitting: Physical Therapy

## 2020-04-27 ENCOUNTER — Other Ambulatory Visit: Payer: Self-pay

## 2020-04-27 DIAGNOSIS — M6281 Muscle weakness (generalized): Secondary | ICD-10-CM

## 2020-04-27 DIAGNOSIS — M2560 Stiffness of unspecified joint, not elsewhere classified: Secondary | ICD-10-CM

## 2020-04-27 DIAGNOSIS — M25612 Stiffness of left shoulder, not elsewhere classified: Secondary | ICD-10-CM

## 2020-04-27 NOTE — Therapy (Signed)
[place] MAIN [facility] SERVICES [address] [place], [place], [address] Phone: [phone]   Fax:  [phone]  Physical Therapy Treatment  Patient Details  Name: Shannon Donovan MRN: [id number] Date of Birth: [birth year] Referring Provider (PT): Jennings Books   Encounter Date: 04/27/2020   PT End of Session - 04/27/20 0903    Visit Number 4    Number of Visits 17    Date for PT Re-Evaluation 06/11/20    PT Start Time 0845    PT Stop Time 0925    PT Time Calculation (min) 40 min    Equipment Utilized During Treatment Gait belt    Activity Tolerance Patient tolerated treatment well;Patient limited by pain    Behavior During Therapy WFL for tasks assessed/performed           Past Medical History:  Diagnosis Date  . Adolescent scoliosis   . Anxiety   . Atrial fibrillation ([place])   . Dysrhythmia    A fib  . History of chicken pox   . Hyperlipidemia   . Hyperplastic colon polyp 09/05/2016   Tubular adenoma of colon  . Hypertension    benign/ controlled  . Short-term memory loss   . Shoulder tendinitis    LEFT upper biceps tendonitis, rotator cuff tendinitis  . Stroke ([place]) 78/2423   embolic CVA/ short term memory loss    Past Surgical History:  Procedure Laterality Date  . BACK SURGERY     x2 at age 79 and 75 for scoliosis  . COLONOSCOPY N/A 09/05/2016   Procedure: COLONOSCOPY;  Surgeon: Lollie Sails, MD;  Location: [facility] ENDOSCOPY;  Service: Endoscopy;  Laterality: N/A;  Pt on Coumadin (PT/ INR)  . SHOULDER ARTHROSCOPY WITH OPEN ROTATOR CUFF REPAIR Left 02/08/2018   Procedure: SHOULDER ARTHROSCOPY WITH OPEN ROTATOR CUFF REPAIR;  Surgeon: Corky Mull, MD;  Location: [facility] ORS;  Service: Orthopedics;  Laterality: Left;  . SHOULDER CLOSED REDUCTION Left 05/09/2018   Procedure: CLOSED MANIPULATION SHOULDER  UNDER ANESTHESIA WITH STEROID INJECTION;  Surgeon: Corky Mull, MD;  Location: [place];  Service:  Orthopedics;  Laterality: Left;  . TONSILLECTOMY      There were no vitals filed for this visit.   Subjective Assessment - 04/27/20 0850    Subjective Patient reports that she is doing her exercises. She is not having any shoulder pain today.    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 She is having diffiutly with dressing and bathing with some effort. She is having pain intermittent during activiites.She had rotator cuff surgery L shoulder 2 years ago. She had a second surgery 2 months after the first surgery. She had PT following the left shoulder surgery.02/08/18, then folowed up with manipulation 6/19.    Limitations House hold activities    Patient Stated Goals to be able to use her left UE for functional activities             Treatment: Arm ranger in sitting fwd/bwd , and side to side x 10  MH to L shoulder during exercises: PROM to left shoulder flex / abd/ ER/ IR, L elbow ext and supination and pronation x 10 Scapula mobilization L in sidelying ant/post, medial / lateral x 10 each direction Soft tissue mobility to left scapula musculature Elbow extension PROM and elbow supination PROM x 30 x 3  Supine bench press with 1 lb rod BUE shoulder flex towards ceiling STM to scapula musculature in prone with tenderness to light touch Patient performed with instruction, verbal cues, tactile cues of therapist: goal:increase tissue extensibility, promote proper posture, improve mobility                         PT Education - 04/27/20 0850    Education Details HEP    Person(s) Educated Patient    Methods Explanation    Comprehension Verbalized understanding;Returned demonstration;Need further instruction;Tactile cues required;Verbal cues required            PT Short Term Goals - 04/16/20 1335      PT SHORT TERM GOAL #1   Title Patient will be independent in home  exercise program to improve strength/mobility for better functional independence with ADLs.    Time 4    Period Weeks    Status New    Target Date 05/14/20      PT SHORT TERM GOAL #2   Title Patient will be able to perform household work/ chores without increase in symptoms.    Time 4    Period Weeks    Status New             PT Long Term Goals - 04/16/20 1336      PT LONG TERM GOAL #1   Title Patient will report a worst pain of 3/10 on VAS in left shoulder and elbow  to improve tolerance with ADLs and reduced symptoms with activities.    Period Weeks    Status New    Target Date 06/11/20      PT LONG TERM GOAL #2   Title Patient will demonstrate adequate shoulder ROM and strength to be able to shave and dress independently with pain less than 3/10.    Time 8    Period Weeks    Status New    Target Date 06/11/20      PT LONG TERM GOAL #3   Title Patient will decrease Quick DASH score by > 8 points demonstrating reduced self-reported upper extremity disability.    Time 8    Status New    Target Date 06/11/20                 Plan - 04/27/20 0904    Clinical Impression Statement  Pt was able to perform all exercises today PROM , AAROM and AROM.Marland Kitchen Pt requires verbal, visual and tactile cues during exercise in order to complete tasks with proper form and technique, .  Pt would continue to benefit from skilled PT services in order to further strengthen LUE and decrease pain to L shoulder and elbow,   Personal Factors and Comorbidities Age;Comorbidity 1    Examination-Activity Limitations Bathing;Dressing;Carry;Reach Overhead;Sleep    Examination-Participation Restrictions Cleaning;Meal Prep;Community Activity;Yard Work    Stability/Clinical Decision Making Stable/Uncomplicated    Rehab Potential Fair    PT Frequency 2x / week    PT Duration 8 weeks    PT Treatment/Interventions Electrical Stimulation;Cryotherapy;ADLs/Self Care Home Management;Moist  Heat;Ultrasound;Gait training;Therapeutic activities;Therapeutic exercise;Patient/family education;Manual techniques;Passive range of motion;Dry needling;Splinting;Taping;Joint Manipulations    PT Next Visit Plan manual therapy    Consulted and Agree with Plan of Care Patient           Patient will benefit from skilled therapeutic intervention in order to improve the following deficits and impairments:  Decreased activity tolerance, Decreased endurance, Decreased range of motion, Decreased  strength, Impaired flexibility, Impaired tone, Pain, Impaired UE functional use  Visit Diagnosis: Muscle weakness (generalized)  Limited joint range of motion (ROM)  Stiffness of left shoulder, not elsewhere classified     Problem List Patient Active Problem List   Diagnosis Date Noted  . Left rotator cuff tear 02/08/2018    Alanson Puls, [place] DPT 04/27/2020, 9:25 AM  [place] MAIN [facility] SERVICES [address] [place], [place], [address] Phone: [phone]   Fax:  [phone]  Name: Shannon Donovan MRN: [id number] Date of Birth: 12-17-1950

## 2020-04-29 ENCOUNTER — Other Ambulatory Visit: Payer: Self-pay

## 2020-04-29 ENCOUNTER — Encounter: Payer: Self-pay | Admitting: Physical Therapy

## 2020-04-29 ENCOUNTER — Ambulatory Visit: Payer: Medicare Other | Admitting: Physical Therapy

## 2020-04-29 DIAGNOSIS — M2560 Stiffness of unspecified joint, not elsewhere classified: Secondary | ICD-10-CM | POA: Diagnosis not present

## 2020-04-29 DIAGNOSIS — M6281 Muscle weakness (generalized): Secondary | ICD-10-CM

## 2020-04-29 DIAGNOSIS — M25612 Stiffness of left shoulder, not elsewhere classified: Secondary | ICD-10-CM

## 2020-04-29 NOTE — Therapy (Addendum)
[place] MAIN [facility] SERVICES [address] [place], [place], [address] Phone: [phone]   Fax:  [phone]  Physical Therapy Treatment  Patient Details  Name: Shannon Donovan MRN: [id number] Date of Birth: Feb 12, 1951 Referring Provider (PT): Jennings Books   Encounter Date: 04/29/2020   PT End of Session - 04/29/20 0856    Visit Number 5    Number of Visits 17    Date for PT Re-Evaluation 06/11/20    PT Start Time 0850    PT Stop Time 0930    PT Time Calculation (min) 40 min    Equipment Utilized During Treatment Gait belt    Activity Tolerance Patient tolerated treatment well;Patient limited by pain    Behavior During Therapy WFL for tasks assessed/performed           Past Medical History:  Diagnosis Date  . Adolescent scoliosis   . Anxiety   . Atrial fibrillation ([place])   . Dysrhythmia    A fib  . History of chicken pox   . Hyperlipidemia   . Hyperplastic colon polyp 09/05/2016   Tubular adenoma of colon  . Hypertension    benign/ controlled  . Short-term memory loss   . Shoulder tendinitis    LEFT upper biceps tendonitis, rotator cuff tendinitis  . Stroke ([place]) 64/4034   embolic CVA/ short term memory loss    Past Surgical History:  Procedure Laterality Date  . BACK SURGERY     x2 at age 63 and 89 for scoliosis  . COLONOSCOPY N/A 09/05/2016   Procedure: COLONOSCOPY;  Surgeon: Lollie Sails, MD;  [facility]: [facility] ENDOSCOPY;  Service: Endoscopy;  Laterality: N/A;  Pt on Coumadin (PT/ INR)  . SHOULDER ARTHROSCOPY WITH OPEN ROTATOR CUFF REPAIR Left 02/08/2018   Procedure: SHOULDER ARTHROSCOPY WITH OPEN ROTATOR CUFF REPAIR;  Surgeon: Corky Mull, MD;  Location: [facility] ORS;  Service: Orthopedics;  Laterality: Left;  . SHOULDER CLOSED REDUCTION Left 05/09/2018   Procedure: CLOSED MANIPULATION SHOULDER  UNDER ANESTHESIA WITH STEROID INJECTION;  Surgeon: Corky Mull, MD;  Location: [place];  Service:  Orthopedics;  Laterality: Left;  . TONSILLECTOMY      There were no vitals filed for this visit.   Subjective Assessment - 04/29/20 0855    Subjective Patient reports that she is doing her exercises. She is not having any shoulder pain today.Her elbow is hurting.    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 She is having diffiutly with dressing and bathing with some effort. She is having pain intermittent during activiites.She had rotator cuff surgery L shoulder 2 years ago. She had a second surgery 2 months after the first surgery. She had PT following the left shoulder surgery.02/08/18, then folowed up with manipulation 6/19.    Limitations House hold activities    Patient Stated Goals to be able to use her left UE for functional activities    Currently in Pain? Yes    Pain Score 6     Pain Location Elbow    Pain Orientation Left    Pain Descriptors / Indicators Aching    Pain Onset 1 to 4 weeks ago    Pain Frequency Intermittent    Aggravating Factors  movement    Pain Relieving Factors rest    Effect of Pain on Daily Activities difficult  Treatment:  Trigger Point Dry Needling (TDN), unbilled Education performed with patient regarding potential benefit of TDN. Reviewed precautions and risks with patient. Extensive time spent with pt to ensure full understanding of TDN risks. Pt provided verbal consent to treatment. TDN performed to L bicep with 2, 0.25 x 40 single needle placements with deep ache but no visible/palpable twitch. Pistoning technique utilized. Improved pain-free elbow extension following intervention.  Lyndel Safe Huprich PT, DPT, GCS    STM to reduce tone in L elbow musculature grade 2 and 3  PROM to left elbow extension with static stretch and e-stim  9.8 volts, 80/150 hz, carrier freq 4000 Hz x 20 mins HEP instruction for stretching L elbow with towel under upper arm  to stretch the  Patient performed with instruction, verbal cues, tactile cues of therapist: goal: increase tissue extensibility, promote proper posture, improve mobility                          PT Education - 04/29/20 0855    Education Details HEP    Person(s) Educated Patient    Methods Explanation    Comprehension Verbalized understanding            PT Short Term Goals - 04/16/20 1335      PT SHORT TERM GOAL #1   Title Patient will be independent in home exercise program to improve strength/mobility for better functional independence with ADLs.    Time 4    Period Weeks    Status New    Target Date 05/14/20      PT SHORT TERM GOAL #2   Title Patient will be able to perform household work/ chores without increase in symptoms.    Time 4    Period Weeks    Status New             PT Long Term Goals - 04/16/20 1336      PT LONG TERM GOAL #1   Title Patient will report a worst pain of 3/10 on VAS in left shoulder and elbow  to improve tolerance with ADLs and reduced symptoms with activities.    Period Weeks    Status New    Target Date 06/11/20      PT LONG TERM GOAL #2   Title Patient will demonstrate adequate shoulder ROM and strength to be able to shave and dress independently with pain less than 3/10.    Time 8    Period Weeks    Status New    Target Date 06/11/20      PT LONG TERM GOAL #3   Title Patient will decrease Quick DASH score by > 8 points demonstrating reduced self-reported upper extremity disability.    Time 8    Status New    Target Date 06/11/20                 Plan - 04/29/20 0856    Clinical Impression Statement Patient has continued tightness in left elbow during extension and was educated in elbow stretching for HEP. She tolerated dry needling and manual STM tl left elbow to decrease flexor tone and improve elbow extension. She will continue to benefit from skilled PT to improve ROM and decrease pain to left  elbow.   Personal Factors and Comorbidities Age;Comorbidity 1    Examination-Activity Limitations Bathing;Dressing;Carry;Reach Overhead;Sleep    Examination-Participation Restrictions Cleaning;Meal Prep;Community Activity;Yard Work    Stability/Clinical Decision Making Stable/Uncomplicated  Rehab Potential Fair    PT Frequency 2x / week    PT Duration 8 weeks    PT Treatment/Interventions Electrical Stimulation;Cryotherapy;ADLs/Self Care Home Management;Moist Heat;Ultrasound;Gait training;Therapeutic activities;Therapeutic exercise;Patient/family education;Manual techniques;Passive range of motion;Dry needling;Splinting;Taping;Joint Manipulations    PT Next Visit Plan manual therapy    Consulted and Agree with Plan of Care Patient           Patient will benefit from skilled therapeutic intervention in order to improve the following deficits and impairments:  Decreased activity tolerance, Decreased endurance, Decreased range of motion, Decreased strength, Impaired flexibility, Impaired tone, Pain, Impaired UE functional use  Visit Diagnosis: Muscle weakness (generalized)  Limited joint range of motion (ROM)  Stiffness of left shoulder, not elsewhere classified     Problem List Patient Active Problem List   Diagnosis Date Noted  . Left rotator cuff tear 02/08/2018    Alanson Puls, [place] DPT 04/29/2020, 9:25 AM  [place] MAIN [facility] SERVICES [address] [place], [place], [address] Phone: [phone]   Fax:  [phone]  Name: Shannon Donovan MRN: [id number] Date of Birth: 12-May-1951

## 2020-05-04 ENCOUNTER — Ambulatory Visit: Payer: Medicare Other | Admitting: Physical Therapy

## 2020-05-04 ENCOUNTER — Other Ambulatory Visit: Payer: Self-pay

## 2020-05-04 ENCOUNTER — Encounter: Payer: Self-pay | Admitting: Physical Therapy

## 2020-05-04 DIAGNOSIS — M2560 Stiffness of unspecified joint, not elsewhere classified: Secondary | ICD-10-CM

## 2020-05-04 DIAGNOSIS — M25612 Stiffness of left shoulder, not elsewhere classified: Secondary | ICD-10-CM

## 2020-05-04 DIAGNOSIS — M6281 Muscle weakness (generalized): Secondary | ICD-10-CM

## 2020-05-04 NOTE — Therapy (Addendum)
[place] MAIN [facility] SERVICES [address] [place], [place], [address] Phone: [phone]   Fax:  [phone]  Physical Therapy Treatment  Patient Details  Name: Shannon Donovan MRN: [id number] Date of Birth: 1951-10-11 Referring Provider (PT): Jennings Books   Encounter Date: 05/04/2020   PT End of Session - 05/04/20 0936    Visit Number 6    Number of Visits 17    Date for PT Re-Evaluation 06/11/20    PT Start Time 0850    PT Stop Time 0930    PT Time Calculation (min) 40 min    Equipment Utilized During Treatment Gait belt    Activity Tolerance Patient tolerated treatment well;Patient limited by pain    Behavior During Therapy WFL for tasks assessed/performed           Past Medical History:  Diagnosis Date  . Adolescent scoliosis   . Anxiety   . Atrial fibrillation ([place])   . Dysrhythmia    A fib  . History of chicken pox   . Hyperlipidemia   . Hyperplastic colon polyp 09/05/2016   Tubular adenoma of colon  . Hypertension    benign/ controlled  . Short-term memory loss   . Shoulder tendinitis    LEFT upper biceps tendonitis, rotator cuff tendinitis  . 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 BACK SURGERY     x2 at age 80 and 37 for scoliosis  . COLONOSCOPY N/A 09/05/2016   Procedure: COLONOSCOPY;  Surgeon: Lollie Sails, MD;  Location: [facility] ENDOSCOPY;  Service: Endoscopy;  Laterality: N/A;  Pt on Coumadin (PT/ INR)  . SHOULDER ARTHROSCOPY WITH OPEN ROTATOR CUFF REPAIR Left 02/08/2018   Procedure: SHOULDER ARTHROSCOPY WITH OPEN ROTATOR CUFF REPAIR;  Surgeon: Corky Mull, MD;  Location: [facility] ORS;  Service: Orthopedics;  Laterality: Left;  . SHOULDER CLOSED REDUCTION Left 05/09/2018   Procedure: CLOSED MANIPULATION SHOULDER  UNDER ANESTHESIA WITH STEROID INJECTION;  Surgeon: Corky Mull, MD;  Location: [facility];  Service:  Orthopedics;  Laterality: Left;  . TONSILLECTOMY      There were no vitals filed for this visit.   Subjective Assessment - 05/04/20 0934    Subjective Patient reports that she is doing her exercises. She is not having any shoulder pain today.Her elbow is hurting.    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 She is having diffiutly with dressing and bathing with some effort. She is having pain intermittent during activiites.She had rotator cuff surgery L shoulder 2 years ago. She had a second surgery 2 months after the first surgery. She had PT following the left shoulder surgery.02/08/18, then folowed up with manipulation 6/19.    Limitations House hold activities    Patient Stated Goals to be able to use her left UE for functional activities    Currently in Pain? Yes    Pain Score 5     Pain Location Elbow    Pain Orientation Left    Pain Descriptors / Indicators Aching    Pain Type Chronic pain    Pain Onset 1 to 4 weeks ago    Aggravating Factors  movement    Pain Relieving Factors rest    Effect of Pain on Daily Activities difficult  Treatment: [place] 1.5 cm squared, x 10 mins to left elbow and arm musculature E-stim  9.6 volts, 80/150 hz, carrier freq 4000 Hz x 20 mins MH to L shoulder/elbow during exercises: PROM to left shoulder flex / abd/ ER/ IR, L elbow ext and supination and pronation x 10 Scapula mobilization L in sidelying ant/post, medial / lateral x 10 each direction Soft tissue mobility to left scapula musculature Elbow extension PROM and elbow supination PROM x 30 x 3  Supine bench press with 1 lb rod BUE shoulder flex towards ceiling STM to scapula musculature in prone with tenderness to light touch Patient performed with instruction, verbal cues, tactile cues of therapist: goal:increase tissue extensibility, promote proper posture, improve  mobility                         PT Education - 05/04/20 0936    Education Details HEP    Person(s) Educated Patient    Methods Explanation    Comprehension Verbalized understanding;Returned demonstration;Tactile cues required;Need further instruction            PT Short Term Goals - 04/16/20 1335      PT SHORT TERM GOAL #1   Title Patient will be independent in home exercise program to improve strength/mobility for better functional independence with ADLs.    Time 4    Period Weeks    Status New    Target Date 05/14/20      PT SHORT TERM GOAL #2   Title Patient will be able to perform household work/ chores without increase in symptoms.    Time 4    Period Weeks    Status New             PT Long Term Goals - 04/16/20 1336      PT LONG TERM GOAL #1   Title Patient will report a worst pain of 3/10 on VAS in left shoulder and elbow  to improve tolerance with ADLs and reduced symptoms with activities.    Period Weeks    Status New    Target Date 06/11/20      PT LONG TERM GOAL #2   Title Patient will demonstrate adequate shoulder ROM and strength to be able to shave and dress independently with pain less than 3/10.    Time 8    Period Weeks    Status New    Target Date 06/11/20      PT LONG TERM GOAL #3   Title Patient will decrease Quick DASH score by > 8 points demonstrating reduced self-reported upper extremity disability.    Time 8    Status New    Target Date 06/11/20                 Plan - 05/04/20 2992    Clinical Impression Statement Patient reports left elbow discomfort and tolerates [place] and e-stim to left elbow during PROM and STM to left elbow and shoulder musculature. She was recommended to see a prosthesis for a elbow splint to assist with improving elbow extension and decrease her pain level. Patient reports minimal pain following treatment.    Personal Factors and Comorbidities Age;Comorbidity 1    Examination-Activity  Limitations Bathing;Dressing;Carry;Reach Overhead;Sleep    Examination-Participation Restrictions Cleaning;Meal Prep;Community Activity;Yard Work    Stability/Clinical Decision Making Stable/Uncomplicated    Rehab Potential Fair    PT Frequency 2x / week    PT Duration 8 weeks  PT Treatment/Interventions Electrical Stimulation;Cryotherapy;ADLs/Self Care Home Management;Moist Heat;Ultrasound;Gait training;Therapeutic activities;Therapeutic exercise;Patient/family education;Manual techniques;Passive range of motion;Dry needling;Splinting;Taping;Joint Manipulations    PT Next Visit Plan manual therapy    Consulted and Agree with Plan of Care Patient           Patient will benefit from skilled therapeutic intervention in order to improve the following deficits and impairments:  Decreased activity tolerance, Decreased endurance, Decreased range of motion, Decreased strength, Impaired flexibility, Impaired tone, Pain, Impaired UE functional use  Visit Diagnosis: Limited joint range of motion (ROM)  Muscle weakness (generalized)  Stiffness of left shoulder, not elsewhere classified     Problem List Patient Active Problem List   Diagnosis Date Noted  . Left rotator cuff tear 02/08/2018    Alanson Puls, [place] DPT 05/04/2020, 9:52 AM  [place] MAIN [facility] SERVICES [address] [place], [place], [address] Phone: [phone]   Fax:  [phone]  Name: Shannon Donovan MRN: [id number] Date of Birth: November 01, 1951

## 2020-05-06 ENCOUNTER — Other Ambulatory Visit: Payer: Self-pay

## 2020-05-06 ENCOUNTER — Encounter: Payer: Self-pay | Admitting: Physical Therapy

## 2020-05-06 ENCOUNTER — Ambulatory Visit: Payer: Medicare Other | Admitting: Physical Therapy

## 2020-05-06 DIAGNOSIS — M6281 Muscle weakness (generalized): Secondary | ICD-10-CM

## 2020-05-06 DIAGNOSIS — M2560 Stiffness of unspecified joint, not elsewhere classified: Secondary | ICD-10-CM | POA: Diagnosis not present

## 2020-05-06 DIAGNOSIS — M25612 Stiffness of left shoulder, not elsewhere classified: Secondary | ICD-10-CM

## 2020-05-06 NOTE — Therapy (Signed)
[place] MAIN [facility] SERVICES [address] [place], [place], [address] Phone: [phone]   Fax:  [phone]  Physical Therapy Treatment  Patient Details  Name: Shannon Donovan MRN: [id number] Date of Birth: Apr 05, 1951 Referring Provider (PT): Jennings Books   Encounter Date: 05/06/2020   PT End of Session - 05/06/20 0915    Visit Number 7    Number of Visits 17    Date for PT Re-Evaluation 06/11/20    PT Start Time 0850    PT Stop Time 0930    PT Time Calculation (min) 40 min    Equipment Utilized During Treatment Gait belt    Activity Tolerance Patient tolerated treatment well;Patient limited by pain    Behavior During Therapy WFL for tasks assessed/performed           Past Medical History:  Diagnosis Date  . Adolescent scoliosis   . Anxiety   . Atrial fibrillation ([place])   . Dysrhythmia    A fib  . History of chicken pox   . Hyperlipidemia   . Hyperplastic colon polyp 09/05/2016   Tubular adenoma of colon  . Hypertension    benign/ controlled  . Short-term memory loss   . Shoulder tendinitis    LEFT upper biceps tendonitis, rotator cuff tendinitis  . Stroke ([place]) 50/3546   embolic CVA/ short term memory loss    Past Surgical History:  Procedure Laterality Date  . BACK SURGERY     x2 at age 69 and 89 for scoliosis  . COLONOSCOPY N/A 09/05/2016   Procedure: COLONOSCOPY;  Surgeon: Lollie Sails, MD;  Location: [facility] ENDOSCOPY;  Service: Endoscopy;  Laterality: N/A;  Pt on Coumadin (PT/ INR)  . SHOULDER ARTHROSCOPY WITH OPEN ROTATOR CUFF REPAIR Left 02/08/2018   Procedure: SHOULDER ARTHROSCOPY WITH OPEN ROTATOR CUFF REPAIR;  Surgeon: Corky Mull, MD;  Location: [facility] ORS;  Service: Orthopedics;  Laterality: Left;  . SHOULDER CLOSED REDUCTION Left 05/09/2018   Procedure: CLOSED MANIPULATION SHOULDER  UNDER ANESTHESIA WITH STEROID INJECTION;  Surgeon: Corky Mull, MD;  Location: [place];  Service:  Orthopedics;  Laterality: Left;  . TONSILLECTOMY      There were no vitals filed for this visit.   Subjective Assessment - 05/06/20 0910    Subjective Patient reports that she is doing her exercises. She is haing L elbow  pain 7/10 today when arrived, but when she laid down the elbow pain was at 0/10. 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 She is having diffiutly with dressing and bathing with some effort. She is having pain intermittent during activiites.She had rotator cuff surgery L shoulder 2 years ago. She had a second surgery 2 months after the first surgery. She had PT following the left shoulder surgery.02/08/18, then folowed up with manipulation 6/19.    Limitations House hold activities    Patient Stated Goals to be able to use her left UE for functional activities    Pain Onset 1 to 4 weeks ago           Treatment: [place] 1.5 cm squared, x 10 mins to left elbow and arm musculature E-stim 10  volts, 80/150 hz, carrier freq 4000 Hz x 20 mins MH to L shoulder/elbow during exercises: PROM to left shoulderflex / abd/ ER/ IR, L elbow ext and supination  and pronationx 10 Scapula mobilization L in sidelying ant/post, medial / lateral x 10 each direction Soft tissue mobility to left scapula musculature Elbow extension PROM and elbow supination PROM x 30 x 3  Supine bench press with 1 lb rod BUE shoulder flex towards ceiling STM to scapula musculature in prone with tenderness to light touch Elbow lacks 30 deg extension due to elbow contracuture Patient performed with instruction, verbal cues, tactile cues of therapist: goal:increase tissue extensibility, promote proper posture, improve mobility                          PT Education - 05/06/20 0914    Education Details HEP    Person(s) Educated Patient    Methods Explanation    Comprehension Verbalized  understanding;Returned demonstration;Verbal cues required;Tactile cues required;Need further instruction            PT Short Term Goals - 04/16/20 1335      PT SHORT TERM GOAL #1   Title Patient will be independent in home exercise program to improve strength/mobility for better functional independence with ADLs.    Time 4    Period Weeks    Status New    Target Date 05/14/20      PT SHORT TERM GOAL #2   Title Patient will be able to perform household work/ chores without increase in symptoms.    Time 4    Period Weeks    Status New             PT Long Term Goals - 04/16/20 1336      PT LONG TERM GOAL #1   Title Patient will report a worst pain of 3/10 on VAS in left shoulder and elbow  to improve tolerance with ADLs and reduced symptoms with activities.    Period Weeks    Status New    Target Date 06/11/20      PT LONG TERM GOAL #2   Title Patient will demonstrate adequate shoulder ROM and strength to be able to shave and dress independently with pain less than 3/10.    Time 8    Period Weeks    Status New    Target Date 06/11/20      PT LONG TERM GOAL #3   Title Patient will decrease Quick DASH score by > 8 points demonstrating reduced self-reported upper extremity disability.    Time 8    Status New    Target Date 06/11/20                 Plan - 05/06/20 0915    Clinical Impression Statement Patient reports left elbow discomfort and tolerates [place] and e-stim to left elbow during PROM and STM to left elbow and shoulder musculature. She was recommended to see a prosthesis for a elbow splint to assist with improving elbow extension and decrease her pain level. Patient reports minimal pain following treatment     Personal Factors and Comorbidities Age;Comorbidity 1    Examination-Activity Limitations Bathing;Dressing;Carry;Reach Overhead;Sleep    Examination-Participation Restrictions Cleaning;Meal Prep;Community Activity;Yard Work    PT Frequency 2x / week     PT Treatment/Interventions Print production planner;Therapeutic activities;Therapeutic exercise;Patient/family education;Manual techniques;Passive range of motion;Dry needling;Splinting;Taping;Joint Manipulations    PT Next Visit Plan manual therapy    Consulted and Agree with Plan of Care Patient;Family member/caregiver           Patient will benefit from skilled therapeutic  intervention in order to improve the following deficits and impairments:  Decreased activity tolerance, Decreased endurance, Decreased range of motion, Decreased strength, Impaired flexibility, Impaired tone, Pain, Impaired UE functional use  Visit Diagnosis: Limited joint range of motion (ROM)  Muscle weakness (generalized)  Stiffness of left shoulder, not elsewhere classified     Problem List Patient Active Problem List   Diagnosis Date Noted  . Left rotator cuff tear 02/08/2018    Alanson Puls, [place] DPT 05/06/2020, 9:21 AM  [place] MAIN [facility] SERVICES [address] [place], [place], [address] Phone: [phone]   Fax:  [phone]  Name: Shannon Donovan MRN: [id number] Date of Birth: 11-Jan-1951

## 2020-05-11 ENCOUNTER — Other Ambulatory Visit: Payer: Self-pay

## 2020-05-11 ENCOUNTER — Encounter: Payer: Self-pay | Admitting: Physical Therapy

## 2020-05-11 ENCOUNTER — Ambulatory Visit: Payer: Medicare Other | Admitting: Physical Therapy

## 2020-05-11 DIAGNOSIS — M6281 Muscle weakness (generalized): Secondary | ICD-10-CM

## 2020-05-11 DIAGNOSIS — M2560 Stiffness of unspecified joint, not elsewhere classified: Secondary | ICD-10-CM

## 2020-05-11 DIAGNOSIS — M25612 Stiffness of left shoulder, not elsewhere classified: Secondary | ICD-10-CM

## 2020-05-11 NOTE — Therapy (Signed)
[place] MAIN [facility] SERVICES [address] [place], [place], [address] Phone: [phone]   Fax:  [phone]  Physical Therapy Treatment  Patient Details  Name: Suleika Donavan MRN: [id number] Date of Birth: Apr 07, 1951 Referring Provider (PT): shah, [place]   Encounter Date: 05/11/2020   PT End of Session - 05/11/20 1200    Visit Number 8    Number of Visits 17    Date for PT Re-Evaluation 06/11/20    PT Start Time 3664    PT Stop Time 1213    PT Time Calculation (min) 38 min    Equipment Utilized During Treatment Gait belt    Activity Tolerance Patient tolerated treatment well;Patient limited by pain    Behavior During Therapy WFL for tasks assessed/performed           Past Medical History:  Diagnosis Date  . Adolescent scoliosis   . Anxiety   . Atrial fibrillation ([place])   . Dysrhythmia    A fib  . History of chicken pox   . Hyperlipidemia   . Hyperplastic colon polyp 09/05/2016   Tubular adenoma of colon  . Hypertension    benign/ controlled  . Short-term memory loss   . Shoulder tendinitis    LEFT upper biceps tendonitis, rotator cuff tendinitis  . Stroke ([place]) 40/3474   embolic CVA/ short term memory loss    Past Surgical History:  Procedure Laterality Date  . BACK SURGERY     x2 at age 12 and 23 for scoliosis  . COLONOSCOPY N/A 09/05/2016   Procedure: COLONOSCOPY;  Surgeon: Lollie Sails, MD;  Location: [facility] ENDOSCOPY;  Service: Endoscopy;  Laterality: N/A;  Pt on Coumadin (PT/ INR)  . SHOULDER ARTHROSCOPY WITH OPEN ROTATOR CUFF REPAIR Left 02/08/2018   Procedure: SHOULDER ARTHROSCOPY WITH OPEN ROTATOR CUFF REPAIR;  Surgeon: Corky Mull, MD;  Location: [facility] ORS;  Service: Orthopedics;  Laterality: Left;  . SHOULDER CLOSED REDUCTION Left 05/09/2018   Procedure: CLOSED MANIPULATION SHOULDER  UNDER ANESTHESIA WITH STEROID INJECTION;  Surgeon: Corky Mull, MD;  Location: [place];  Service:  Orthopedics;  Laterality: Left;  . TONSILLECTOMY      There were no vitals filed for this visit.   Subjective Assessment - 05/11/20 1156    Subjective Patient reports that she is doing her exercises. She is haing L elbow  pain 5/10 today when arrived, but when she laid down the elbow pain was at 0/10. 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 She is having diffiutly with dressing and bathing with some effort. She is having pain intermittent during activiites.She had rotator cuff surgery L shoulder 2 years ago. She had a second surgery 2 months after the first surgery. She had PT following the left shoulder surgery.02/08/18, then folowed up with manipulation 6/19.    Limitations House hold activities    How long can you sit comfortably? unlimited    How long can you stand comfortably? not sure    How long can you walk comfortably? not sure    Patient Stated Goals to be able to use her left UE for functional activities    Currently in Pain? Yes    Pain Score 5     Pain Location Elbow    Pain Descriptors / Indicators Aching    Pain Type Chronic  pain    Pain Onset 1 to 4 weeks ago    Pain Frequency Intermittent    Aggravating Factors  hanging straight    Pain Relieving Factors moving it towards her trunl    Effect of Pain on Daily Activities difficult           Treatment: [place] 1.5 cmsquared,x 10 mins to left elbow and arm musculature E-stim9.8 volts, 80/150 hz, carrier freq 4000 Hz x 20 mins MH to L shoulder/elbowduring exercises: PROM to left shoulderflex / abd/ ER/ IR, L elbow ext and supination and pronationx 10 Scapula mobilization L in sidelying ant/post, medial / lateral x 10 each direction Soft tissue mobility to left scapula musculature Elbow extension PROM and elbow supination PROM x 30 x 3  Supine bench press with 1 lb rod BUE shoulder flex towards ceiling STM to scapula  musculature in prone with tenderness to light touch Elbow lacks 30 deg extension due to elbow contracuture Patient performed with instruction, verbal cues, tactile cues of therapist: goal:increase tissue extensibility, promote proper posture, improve mobility                          PT Education - 05/11/20 1200    Education Details HEP    Person(s) Educated Patient    Methods Explanation    Comprehension Verbalized understanding            PT Short Term Goals - 04/16/20 1335      PT SHORT TERM GOAL #1   Title Patient will be independent in home exercise program to improve strength/mobility for better functional independence with ADLs.    Time 4    Period Weeks    Status New    Target Date 05/14/20      PT SHORT TERM GOAL #2   Title Patient will be able to perform household work/ chores without increase in symptoms.    Time 4    Period Weeks    Status New             PT Long Term Goals - 04/16/20 1336      PT LONG TERM GOAL #1   Title Patient will report a worst pain of 3/10 on VAS in left shoulder and elbow  to improve tolerance with ADLs and reduced symptoms with activities.    Period Weeks    Status New    Target Date 06/11/20      PT LONG TERM GOAL #2   Title Patient will demonstrate adequate shoulder ROM and strength to be able to shave and dress independently with pain less than 3/10.    Time 8    Period Weeks    Status New    Target Date 06/11/20      PT LONG TERM GOAL #3   Title Patient will decrease Quick DASH score by > 8 points demonstrating reduced self-reported upper extremity disability.    Time 8    Status New    Target Date 06/11/20                 Plan - 05/11/20 1201    Clinical Impression Statement Patient reports left elbow discomfort and tolerates [place] and e-stim to left elbow during PROM and STM to left elbow and shoulder musculature. She was recommended to see a prosthesis for a elbow splint to assist with  improving elbow extension and decrease her pain level. Patient reports minimal pain following treatment  Personal Factors and Comorbidities Age;Comorbidity 1    Examination-Activity Limitations Bathing;Dressing;Carry;Reach Overhead;Sleep    Examination-Participation Restrictions Cleaning;Meal Prep;Community Activity;Yard Work    PT Frequency 2x / week    PT Treatment/Interventions Print production planner;Therapeutic activities;Therapeutic exercise;Patient/family education;Manual techniques;Passive range of motion;Dry needling;Splinting;Taping;Joint Manipulations    PT Next Visit Plan manual therapy    Consulted and Agree with Plan of Care Patient;Family member/caregiver           Patient will benefit from skilled therapeutic intervention in order to improve the following deficits and impairments:  Decreased activity tolerance, Decreased endurance, Decreased range of motion, Decreased strength, Impaired flexibility, Impaired tone, Pain, Impaired UE functional use  Visit Diagnosis: Muscle weakness (generalized)  Limited joint range of motion (ROM)  Stiffness of left shoulder, not elsewhere classified     Problem List Patient Active Problem List   Diagnosis Date Noted  . Left rotator cuff tear 02/08/2018    Alanson Puls, [place] DPT 05/11/2020, 12:03 PM  [place] MAIN [facility] SERVICES [address] [place], [place], [address] Phone: [phone]   Fax:  [phone]  Name: Corah Willeford MRN: [id number] Date of Birth: 03-25-1951

## 2020-05-13 ENCOUNTER — Other Ambulatory Visit: Payer: Self-pay

## 2020-05-13 ENCOUNTER — Ambulatory Visit: Payer: Medicare Other | Admitting: Physical Therapy

## 2020-05-13 DIAGNOSIS — M2560 Stiffness of unspecified joint, not elsewhere classified: Secondary | ICD-10-CM | POA: Diagnosis not present

## 2020-05-13 DIAGNOSIS — M25612 Stiffness of left shoulder, not elsewhere classified: Secondary | ICD-10-CM

## 2020-05-13 DIAGNOSIS — M6281 Muscle weakness (generalized): Secondary | ICD-10-CM

## 2020-05-13 NOTE — Therapy (Signed)
[place] MAIN [facility] SERVICES [address] [place], [place], [address] Phone: [phone]   Fax:  [phone]  Physical Therapy Treatment  Patient Details  Name: Shannon Donovan MRN: [id number] Date of Birth: 02-26-1951 Referring Provider (PT): shah, [place]   Encounter Date: 05/13/2020   PT End of Session - 05/13/20 1309    Visit Number 9    Number of Visits 17    Date for PT Re-Evaluation 06/11/20    PT Start Time 1100    PT Stop Time 1145    PT Time Calculation (min) 45 min    Equipment Utilized During Treatment Gait belt    Activity Tolerance Patient tolerated treatment well;Patient limited by pain    Behavior During Therapy [facility] for tasks assessed/performed           Past Medical History:  Diagnosis Date  . Adolescent scoliosis   . Anxiety   . Atrial fibrillation ([place])   . Dysrhythmia    A fib  . History of chicken pox   . Hyperlipidemia   . Hyperplastic colon polyp 09/05/2016   Tubular adenoma of colon  . Hypertension    benign/ controlled  . Short-term memory loss   . Shoulder tendinitis    LEFT upper biceps tendonitis, rotator cuff tendinitis  . Stroke ([place]) 62/6948   embolic CVA/ short term memory loss    Past Surgical History:  Procedure Laterality Date  . BACK SURGERY     x2 at age 69 and 56 for scoliosis  . COLONOSCOPY N/A 09/05/2016   Procedure: COLONOSCOPY;  Surgeon: Lollie Sails, MD;  Location: [facility] ENDOSCOPY;  Service: Endoscopy;  Laterality: N/A;  Pt on Coumadin (PT/ INR)  . SHOULDER ARTHROSCOPY WITH OPEN ROTATOR CUFF REPAIR Left 02/08/2018   Procedure: SHOULDER ARTHROSCOPY WITH OPEN ROTATOR CUFF REPAIR;  Surgeon: Corky Mull, MD;  Location: [facility] ORS;  Service: Orthopedics;  Laterality: Left;  . SHOULDER CLOSED REDUCTION Left 05/09/2018   Procedure: CLOSED MANIPULATION SHOULDER  UNDER ANESTHESIA WITH STEROID INJECTION;  Surgeon: Corky Mull, MD;  Location: [place];  Service:  Orthopedics;  Laterality: Left;  . TONSILLECTOMY      There were no vitals filed for this visit.   Subjective Assessment - 05/13/20 1308    Subjective Patient reports that she is doing her exercises. She is haing L elbow  pain 5/10 today when arrived, but when she laid down the elbow pain was at 0/10. 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 She is having diffiutly with dressing and bathing with some effort. She is having pain intermittent during activiites.She had rotator cuff surgery L shoulder 2 years ago. She had a second surgery 2 months after the first surgery. She had PT following the left shoulder surgery.02/08/18, then folowed up with manipulation 6/19.    Limitations House hold activities    How long can you sit comfortably? unlimited    How long can you stand comfortably? not sure    How long can you walk comfortably? not sure    Patient Stated Goals to be able to use her left UE for functional activities    Currently in Pain? Yes    Pain Location Shoulder    Pain Orientation Left    Pain Descriptors / Indicators Aching    Pain Type Chronic pain  Pain Onset 1 to 4 weeks ago           Treatment: [place] 1.5 cmsquared,x 10 mins to left elbow and arm musculature E-stim10.2 volts, 80/150 hz, carrier freq 4000 Hz x 20 mins MH to L shoulder/elbowduring exercises: PROM to left shoulderflex / abd/ ER/ IR, L elbow ext and supination and pronationx 10 Scapula mobilization L in sidelying ant/post, medial / lateral x 10 each direction Soft tissue mobility to left scapula musculature Elbow extension PROM and elbow supination PROM x 30 x 3  Supine bench press with 1 lb rod BUE shoulder flex towards ceiling STM to scapula musculature in prone with tenderness to light touch Elbow lacks 30 deg extension due to elbow contracuture Patient performed with instruction, verbal cues, tactile  cues of therapist: goal:increase tissue extensibility, promote proper posture, improve mobility                          PT Education - 05/13/20 1309    Education Details HEP    Person(s) Educated Patient    Methods Explanation    Comprehension Verbalized understanding;Tactile cues required;Need further instruction            PT Short Term Goals - 04/16/20 1335      PT SHORT TERM GOAL #1   Title Patient will be independent in home exercise program to improve strength/mobility for better functional independence with ADLs.    Time 4    Period Weeks    Status New    Target Date 05/14/20      PT SHORT TERM GOAL #2   Title Patient will be able to perform household work/ chores without increase in symptoms.    Time 4    Period Weeks    Status New             PT Long Term Goals - 04/16/20 1336      PT LONG TERM GOAL #1   Title Patient will report a worst pain of 3/10 on VAS in left shoulder and elbow  to improve tolerance with ADLs and reduced symptoms with activities.    Period Weeks    Status New    Target Date 06/11/20      PT LONG TERM GOAL #2   Title Patient will demonstrate adequate shoulder ROM and strength to be able to shave and dress independently with pain less than 3/10.    Time 8    Period Weeks    Status New    Target Date 06/11/20      PT LONG TERM GOAL #3   Title Patient will decrease Quick DASH score by > 8 points demonstrating reduced self-reported upper extremity disability.    Time 8    Status New    Target Date 06/11/20                 Plan - 05/13/20 1309    Clinical Impression Statement  Patient reports left elbow discomfort and tolerates [place] and e-stim to left elbow during PROM and STM to left elbow and shoulder musculature. She was recommended to see a prosthesis for a elbow splint to assist with improving elbow extension and decrease her pain level. Patient reports minimal pain following treatment   Personal  Factors and Comorbidities Age;Comorbidity 1    Examination-Activity Limitations Bathing;Dressing;Carry;Reach Overhead;Sleep    Examination-Participation Restrictions Cleaning;Meal Prep;Community Activity;Yard Work    PT Frequency 2x / week    PT  Treatment/Interventions Electrical Stimulation;Cryotherapy;ADLs/Self Care Home Management;Moist Heat;Ultrasound;Gait training;Therapeutic activities;Therapeutic exercise;Patient/family education;Manual techniques;Passive range of motion;Dry needling;Splinting;Taping;Joint Manipulations    PT Next Visit Plan manual therapy    Consulted and Agree with Plan of Care Patient;Family member/caregiver           Patient will benefit from skilled therapeutic intervention in order to improve the following deficits and impairments:  Decreased activity tolerance, Decreased endurance, Decreased range of motion, Decreased strength, Impaired flexibility, Impaired tone, Pain, Impaired UE functional use  Visit Diagnosis: Muscle weakness (generalized)  Limited joint range of motion (ROM)  Stiffness of left shoulder, not elsewhere classified     Problem List Patient Active Problem List   Diagnosis Date Noted  . Left rotator cuff tear 02/08/2018    Alanson Puls , PT DPT 05/13/2020, 1:10 PM  [place] MAIN [facility] SERVICES [address] [place], [place], [address] Phone: [phone]   Fax:  [phone]  Name: Joie Hipps MRN: [id number] Date of Birth: 1951/09/30

## 2020-05-20 ENCOUNTER — Ambulatory Visit: Payer: Medicare Other | Attending: Neurology

## 2020-05-20 ENCOUNTER — Other Ambulatory Visit: Payer: Self-pay

## 2020-05-20 DIAGNOSIS — M25612 Stiffness of left shoulder, not elsewhere classified: Secondary | ICD-10-CM | POA: Diagnosis present

## 2020-05-20 DIAGNOSIS — M6281 Muscle weakness (generalized): Secondary | ICD-10-CM | POA: Diagnosis present

## 2020-05-20 DIAGNOSIS — M2560 Stiffness of unspecified joint, not elsewhere classified: Secondary | ICD-10-CM | POA: Insufficient documentation

## 2020-05-20 NOTE — Therapy (Signed)
[place] MAIN [facility] SERVICES [address] [place], [place], [address] Phone: [phone]   Fax:  [phone]  Physical Therapy Progress Note  Dates of reporting period  04/16/20  to  05/20/20  Physical Therapy Treatment  Patient Details  Name: Shannon Donovan MRN: [id number] Date of Birth: 04/25/1951 Referring Provider (PT): [place]   Encounter Date: 05/20/2020   PT End of Session - 05/20/20 0944    Visit Number 10    Number of Visits 17    Date for PT Re-Evaluation 06/11/20    Authorization Type MCR    Authorization Time Period 04/16/20-7/29-21    PT Start Time 0908    PT Stop Time 0958    PT Time Calculation (min) 50 min    Activity Tolerance Patient limited by pain;Patient tolerated treatment well    Behavior During Therapy [facility] for tasks assessed/performed           Past Medical History:  Diagnosis Date   Adolescent scoliosis    Anxiety    Atrial fibrillation ([place])    Dysrhythmia    A fib   History of chicken pox    Hyperlipidemia    Hyperplastic colon polyp 09/05/2016   Tubular adenoma of colon   Hypertension    benign/ controlled   Short-term memory loss    Shoulder tendinitis    LEFT upper biceps tendonitis, rotator cuff tendinitis   Stroke ([place]) 50/9326   embolic CVA/ short term memory loss    Past Surgical History:  Procedure Laterality Date   BACK SURGERY     x2 at age 59 and 66 for scoliosis   COLONOSCOPY N/A 09/05/2016   Procedure: COLONOSCOPY;  Surgeon: Lollie Sails, MD;  Location: [facility] ENDOSCOPY;  Service: Endoscopy;  Laterality: N/A;  Pt on Coumadin (PT/ INR)   SHOULDER ARTHROSCOPY WITH OPEN ROTATOR CUFF REPAIR Left 02/08/2018   Procedure: SHOULDER ARTHROSCOPY WITH OPEN ROTATOR CUFF REPAIR;  Surgeon: Corky Mull, MD;  Location: [facility] ORS;  Service: Orthopedics;  Laterality: Left;   SHOULDER CLOSED REDUCTION Left 05/09/2018   Procedure: CLOSED MANIPULATION SHOULDER  UNDER ANESTHESIA  WITH STEROID INJECTION;  Surgeon: Corky Mull, MD;  Location: [place];  Service: Orthopedics;  Laterality: Left;   TONSILLECTOMY      There were no vitals filed for this visit.   Subjective Assessment - 05/20/20 0911    Subjective Pt doing fine today. Difficulty getting her blood work this morning, also had to fast prior to. Pt has only a few exercises she has been working on at home for scapular strength. She continues to have intermittent pain in left arm, worse wth actiivty, has been using sling occasionally for a few minutes when pain is bad, appears to help to some degree.    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 She is having diffiutly with dressing and bathing with some effort. She is having pain intermittent during activiites.She had rotator cuff surgery L shoulder 2 years ago. She had a second surgery 2 months after the first surgery. She had PT following the left shoulder surgery.02/08/18, then folowed up with manipulation 6/19.    Limitations House hold activities    How long can you sit comfortably? unlimited    How long can you stand comfortably? not sure    How long can you walk comfortably?  not sure    Patient Stated Goals to be able to use her left UE for functional activities    Currently in Pain? Yes    Pain Score 2     Pain Orientation Left   lower arm             [facility] PT Assessment - 05/20/20 0001      Observation/Other Assessments   Focus on Therapeutic Outcomes (FOTO)  45 (55% limited)    52 at evaluation      Tone   Assessment Location Left Upper Extremity      ROM / Strength   AROM / PROM / Strength PROM      PROM   PROM Assessment Site Elbow;Wrist;Forearm    Right/Left Shoulder Left    Left Shoulder Flexion 69 Degrees   70 at evaluation   Left Shoulder ABduction 49 Degrees   70 degrees at eval   Left Shoulder Internal Rotation 73 Degrees   hand  at belly   Left Shoulder External Rotation 9 Degrees    Right/Left Elbow Right;Left    Left Elbow Flexion 105    Left Elbow Extension 27   (30 degrees at evaluation)    Right/Left Forearm Right;Left    Left Forearm Pronation 45 Degrees   WNL at evaluation    Left Forearm Supination 45 Degrees   15 degrees at evaluation      Palpation   Palpation comment no pain with palpation of epicondyles; has pain at olecranon   also has distal brachial pain c activation of shoulder in     LUE Tone   LUE Tone Modified Ashworth      LUE Tone   Modified Ashworth Scale for Grading Hypertonia LUE Considerable increase in muschle tone, passive movement difficult   left triceps, biceps, and shoulder grossly;            Intervention HEP education: -Left elbow AA/ROM in supine x15 -Left elbow A/ROM in supine x10  -echymosis monitoring s/p fall on coumadin (ed for pt and caregiver)      PT Short Term Goals - 05/20/20 0925      PT SHORT TERM GOAL #1   Title Patient will be independent in home exercise program to improve strength/mobility for better functional independence with ADLs.    Time 4    Period Weeks    Status Achieved    Target Date 05/14/20      PT SHORT TERM GOAL #2   Title Patient will be able to perform household work/ chores without increase in symptoms.    Time 4    Period Weeks    Status Partially Met   not exacerbated after activity, mayu be during depending on use   Target Date 05/14/20             PT Long Term Goals - 05/20/20 0927      PT LONG TERM GOAL #1   Title Patient will report a worst pain of 3/10 on VAS in left shoulder and elbow  to improve tolerance with ADLs and reduced symptoms with activities.    Baseline at reassessment 7/7: pain can still be higher than 3/10 (caregiver reports pain unchanged, or sometimes worse)    Time 8    Period Weeks    Status On-going    Target Date 06/11/20      PT LONG TERM GOAL #2   Title Patient will demonstrate  adequate shoulder ROM and strength to be able  to shave and dress independently with pain less than 3/10.    Baseline Achieved    Time 8    Period Weeks    Status Achieved    Target Date 06/11/20      PT LONG TERM GOAL #3   Title Patient will decrease Quick DASH score by > 8 points demonstrating reduced self-reported upper extremity disability.    Time 8    Period Weeks    Status On-going    Target Date 06/11/20                 Plan - 05/20/20 1008    Clinical Impression Statement 10th visit reassessment performed this date. FOTO score shows more disability since eval. No significant improvement in elbow contracture with limitations in both flexion and extension. Supination/pronation of left wrist, and shoulder external rotation/abduction are objectively more limited than at evaluation. Pt given updated HEP activity for TID AA/ROM of Left elbow. Pt scheduled for left elbow splint fitting next week. Pt also mentions a fall last Sunday (4 days ago) with bruise on gluteals, no other injury or pain. Explained caregiver how to monitor for signs of ABLA anemia as she is on coumadin. Progress made toward both long term and short term goals.    Personal Factors and Comorbidities Age;Comorbidity 1    Examination-Activity Limitations Bathing;Dressing;Carry;Reach Overhead;Sleep    Examination-Participation Restrictions Cleaning;Meal Prep;Community Activity;Yard Work    Merchant navy officer Stable/Uncomplicated    Designer, jewellery Low    Rehab Potential Fair    PT Frequency 2x / week    PT Duration 8 weeks    PT Treatment/Interventions Electrical Stimulation;Cryotherapy;ADLs/Self Care Home Management;Moist Heat;Ultrasound;Gait training;Therapeutic activities;Therapeutic exercise;Patient/family education;Manual techniques;Passive range of motion;Dry needling;Splinting;Taping;Joint Manipulations    PT Next Visit Plan manual therapy    Consulted and Agree with Plan of Care  Patient;Family member/caregiver           Patient will benefit from skilled therapeutic intervention in order to improve the following deficits and impairments:  Decreased activity tolerance, Decreased endurance, Decreased range of motion, Decreased strength, Impaired flexibility, Impaired tone, Pain, Impaired UE functional use  Visit Diagnosis: Muscle weakness (generalized)  Limited joint range of motion (ROM)  Stiffness of left shoulder, not elsewhere classified     Problem List Patient Active Problem List   Diagnosis Date Noted   Left rotator cuff tear 02/08/2018   10:16 AM, 05/20/20 Etta Grandchild, PT, DPT Physical Therapist - [place] [phone]     [place] 05/20/2020, 10:14 AM  [place] [address] [place], [place], [address] Phone: [phone]   Fax:  [phone]  Name: Shannon Donovan MRN: [id number] Date of Birth: 1951/10/01

## 2020-05-25 ENCOUNTER — Other Ambulatory Visit: Payer: Self-pay

## 2020-05-25 ENCOUNTER — Encounter: Payer: Self-pay | Admitting: Physical Therapy

## 2020-05-25 ENCOUNTER — Ambulatory Visit: Payer: Medicare Other | Admitting: Physical Therapy

## 2020-05-25 DIAGNOSIS — M25612 Stiffness of left shoulder, not elsewhere classified: Secondary | ICD-10-CM

## 2020-05-25 DIAGNOSIS — M2560 Stiffness of unspecified joint, not elsewhere classified: Secondary | ICD-10-CM

## 2020-05-25 DIAGNOSIS — M6281 Muscle weakness (generalized): Secondary | ICD-10-CM | POA: Diagnosis not present

## 2020-05-25 NOTE — Therapy (Signed)
[place] MAIN [facility] SERVICES [address] [place], [place], [address] Phone: [phone]   Fax:  [phone]  Physical Therapy Treatment  Patient Details  Name: Shannon Donovan MRN: [id number] Date of Birth: [birth year] Referring Provider (PT): shah, [place]   Encounter Date: 05/25/2020   PT End of Session - 05/25/20 0922    Visit Number 11    Number of Visits 17    Date for PT Re-Evaluation 06/11/20    Authorization Type MCR    Authorization Time Period 04/16/20-7/29-21    PT Start Time 0853    PT Stop Time 0932    PT Time Calculation (min) 39 min    Activity Tolerance Patient limited by pain;Patient tolerated treatment well    Behavior During Therapy [facility] for tasks assessed/performed           Past Medical History:  Diagnosis Date  . Adolescent scoliosis   . Anxiety   . Atrial fibrillation ([place])   . Dysrhythmia    A fib  . History of chicken pox   . Hyperlipidemia   . Hyperplastic colon polyp 09/05/2016   Tubular adenoma of colon  . Hypertension    benign/ controlled  . Short-term memory loss   . Shoulder tendinitis    LEFT upper biceps tendonitis, rotator cuff tendinitis  . Stroke ([place]) 04/2693   embolic CVA/ short term memory loss    Past Surgical History:  Procedure Laterality Date  . BACK SURGERY     x2 at age 19 and 68 for scoliosis  . COLONOSCOPY N/A 09/05/2016   Procedure: COLONOSCOPY;  Surgeon: Lollie Sails, MD;  Location: [facility] ENDOSCOPY;  Service: Endoscopy;  Laterality: N/A;  Pt on Coumadin (PT/ INR)  . SHOULDER ARTHROSCOPY WITH OPEN ROTATOR CUFF REPAIR Left 02/08/2018   Procedure: SHOULDER ARTHROSCOPY WITH OPEN ROTATOR CUFF REPAIR;  Surgeon: Corky Mull, MD;  Location: [facility] ORS;  Service: Orthopedics;  Laterality: Left;  . SHOULDER CLOSED REDUCTION Left 05/09/2018   Procedure: CLOSED MANIPULATION SHOULDER  UNDER ANESTHESIA WITH STEROID INJECTION;  Surgeon: Corky Mull, MD;  Location: [place];  Service: Orthopedics;  Laterality: Left;  . TONSILLECTOMY      There were no vitals filed for this visit.   Subjective Assessment - 05/25/20 0920    Subjective Patient is doing better and is having less pain but continues to have a contracture in her elbow.    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 She is having diffiutly with dressing and bathing with some effort. She is having pain intermittent during activiites.She had rotator cuff surgery L shoulder 2 years ago. She had a second surgery 2 months after the first surgery. She had PT following the left shoulder surgery.02/08/18, then folowed up with manipulation 6/19.    Limitations House hold activities    How long can you sit comfortably? unlimited    How long can you stand comfortably? not sure    How long can you walk comfortably? not sure    Patient Stated Goals to be able to use her left UE for functional activities    Currently in Pain? Yes    Pain Score 2     Pain Location Elbow    Pain Orientation Left    Pain Descriptors / Indicators Aching    Pain Onset 1 to 4 weeks  ago           Treatment: [place] 1.5 cmsquared,x 10 mins to left elbow and arm musculature E-stim10.2 volts, 80/150 hz, carrier freq 4000 Hz x 20 mins MH to L shoulder/elbowduring exercises: PROM to left shoulderflex / abd/ ER/ IR, L elbow ext and supination and pronationx 10 Scapula mobilization L in sidelying ant/post, medial / lateral x 10 each direction Soft tissue mobility to left scapula musculature Elbow extension PROM and elbow supination PROM x 30 x 3  Supine bench press with 1 lb rod BUE shoulder flex towards ceiling STM to scapula musculature in prone with tenderness to light touch Elbow lacks 30 deg extension due to elbow contracuture Patient performed with instruction, verbal cues, tactile cues of therapist: goal:increase tissue  extensibility, promote proper posture, improve mobility                          PT Education - 05/25/20 0922    Education Details HEP    Person(s) Educated Patient    Methods Explanation    Comprehension Verbalized understanding            PT Short Term Goals - 05/20/20 0925      PT SHORT TERM GOAL #1   Title Patient will be independent in home exercise program to improve strength/mobility for better functional independence with ADLs.    Time 4    Period Weeks    Status Achieved    Target Date 05/14/20      PT SHORT TERM GOAL #2   Title Patient will be able to perform household work/ chores without increase in symptoms.    Time 4    Period Weeks    Status Partially Met   not exacerbated after activity, mayu be during depending on use   Target Date 05/14/20             PT Long Term Goals - 05/20/20 0927      PT LONG TERM GOAL #1   Title Patient will report a worst pain of 3/10 on VAS in left shoulder and elbow  to improve tolerance with ADLs and reduced symptoms with activities.    Baseline at reassessment 7/7: pain can still be higher than 3/10 (caregiver reports pain unchanged, or sometimes worse)    Time 8    Period Weeks    Status On-going    Target Date 06/11/20      PT LONG TERM GOAL #2   Title Patient will demonstrate adequate shoulder ROM and strength to be able to shave and dress independently with pain less than 3/10.    Baseline Achieved    Time 8    Period Weeks    Status Achieved    Target Date 06/11/20      PT LONG TERM GOAL #3   Title Patient will decrease Quick DASH score by > 8 points demonstrating reduced self-reported upper extremity disability.    Time 8    Period Weeks    Status On-going    Target Date 06/11/20                 Plan - 05/25/20 9326    Clinical Impression Statement Patient reports left elbow discomfort and tolerates [place] and e-stim to left elbow during PROM and STM to left elbow and shoulder  musculature. She was recommended to see a prosthesis for a elbow splint to assist with improving elbow extension and decrease her pain  level. Patient reports minimal pain following treatment   Personal Factors and Comorbidities Age;Comorbidity 1    Examination-Activity Limitations Bathing;Dressing;Carry;Reach Overhead;Sleep    Examination-Participation Restrictions Cleaning;Meal Prep;Community Activity;Yard Work    Stability/Clinical Decision Making Stable/Uncomplicated    Rehab Potential Fair    PT Frequency 2x / week    PT Duration 8 weeks    PT Treatment/Interventions Electrical Stimulation;Cryotherapy;ADLs/Self Care Home Management;Moist Heat;Ultrasound;Gait training;Therapeutic activities;Therapeutic exercise;Patient/family education;Manual techniques;Passive range of motion;Dry needling;Splinting;Taping;Joint Manipulations    PT Next Visit Plan manual therapy    Consulted and Agree with Plan of Care Patient;Family member/caregiver           Patient will benefit from skilled therapeutic intervention in order to improve the following deficits and impairments:  Decreased activity tolerance, Decreased endurance, Decreased range of motion, Decreased strength, Impaired flexibility, Impaired tone, Pain, Impaired UE functional use  Visit Diagnosis: Muscle weakness (generalized)  Limited joint range of motion (ROM)  Stiffness of left shoulder, not elsewhere classified     Problem List Patient Active Problem List   Diagnosis Date Noted  . Left rotator cuff tear 02/08/2018    Alanson Puls, [place] DPT 05/25/2020, 9:24 AM  [place] MAIN [facility] SERVICES [address] [place], [place], [address] Phone: [phone]   Fax:  [phone]  Name: Shannon Donovan MRN: [id number] Date of Birth: 04-Jul-1951

## 2020-05-27 ENCOUNTER — Other Ambulatory Visit: Payer: Self-pay

## 2020-05-27 ENCOUNTER — Ambulatory Visit: Payer: Medicare Other | Admitting: Physical Therapy

## 2020-05-27 ENCOUNTER — Encounter: Payer: Self-pay | Admitting: Physical Therapy

## 2020-05-27 DIAGNOSIS — M6281 Muscle weakness (generalized): Secondary | ICD-10-CM | POA: Diagnosis not present

## 2020-05-27 DIAGNOSIS — M25612 Stiffness of left shoulder, not elsewhere classified: Secondary | ICD-10-CM

## 2020-05-27 DIAGNOSIS — M2560 Stiffness of unspecified joint, not elsewhere classified: Secondary | ICD-10-CM

## 2020-05-27 NOTE — Therapy (Signed)
[place] MAIN [facility] SERVICES [address] [place], [place], [address] Phone: [phone]   Fax:  [phone]  Physical Therapy Treatment  Patient Details  Name: Shannon Donovan MRN: [id number] Date of Birth: 09-Jul-1951 Referring Provider (PT): shah, [place]   Encounter Date: 05/27/2020   PT End of Session - 05/27/20 0952    Visit Number 12    Number of Visits 17    Date for PT Re-Evaluation 06/11/20    Authorization Type MCR    Authorization Time Period 04/16/20-7/29-21    PT Start Time 0848    PT Stop Time 0945    PT Time Calculation (min) 57 min    Activity Tolerance Patient limited by pain;Patient tolerated treatment well    Behavior During Therapy [facility] for tasks assessed/performed           Past Medical History:  Diagnosis Date  . Adolescent scoliosis   . Anxiety   . Atrial fibrillation ([place])   . Dysrhythmia    A fib  . History of chicken pox   . Hyperlipidemia   . Hyperplastic colon polyp 09/05/2016   Tubular adenoma of colon  . Hypertension    benign/ controlled  . Short-term memory loss   . Shoulder tendinitis    LEFT upper biceps tendonitis, rotator cuff tendinitis  . 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 BACK SURGERY     x2 at age 26 and 60 for scoliosis  . COLONOSCOPY N/A 09/05/2016   Procedure: COLONOSCOPY;  Surgeon: Lollie Sails, MD;  Location: [facility] ENDOSCOPY;  Service: Endoscopy;  Laterality: N/A;  Pt on Coumadin (PT/ INR)  . SHOULDER ARTHROSCOPY WITH OPEN ROTATOR CUFF REPAIR Left 02/08/2018   Procedure: SHOULDER ARTHROSCOPY WITH OPEN ROTATOR CUFF REPAIR;  Surgeon: Corky Mull, MD;  Location: [facility] ORS;  Service: Orthopedics;  Laterality: Left;  . SHOULDER CLOSED REDUCTION Left 05/09/2018   Procedure: CLOSED MANIPULATION SHOULDER  UNDER ANESTHESIA WITH STEROID INJECTION;  Surgeon: Corky Mull, MD;  Location: [place];  Service: Orthopedics;  Laterality: Left;  . TONSILLECTOMY      There were no vitals filed for this visit.   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 She is having diffiutly with dressing and bathing with some effort. She is having pain intermittent during activiites.She had rotator cuff surgery L shoulder 2 years ago. She had a second surgery 2 months after the first surgery. She had PT following the left shoulder surgery.02/08/18, then folowed up with manipulation 6/19.    Limitations House hold activities    How long can you sit comfortably? unlimited    How long can you stand comfortably? not sure    How long can you walk comfortably? not sure    Patient Stated Goals to be able to use her left UE for functional activities    Currently in Pain? Yes    Pain Score 2     Pain Location Elbow    Pain Orientation Left    Pain Descriptors / Indicators Aching    Pain Type Chronic pain  Pain Onset 1 to 4 weeks ago    Aggravating Factors  hanging straight    Pain Relieving Factors supporting it on her trunk           Treatment: [place] 1.5 cmsquared,x 10 mins to left elbow and arm musculature E-stim9.8volts, 80/150 hz, carrier freq 4000 Hz x 20 mins MH to L shoulder/elbowduring exercises: PROM to left shoulderflex / abd/ ER/ IR, L elbow ext and supination and pronationx 10 Scapula mobilization L in sidelying ant/post, medial / lateral x 10 each direction Soft tissue mobility to left scapula musculature Elbow extension PROM and elbow supination PROM x 30 x 3  Supine bench press with 1 lb rod BUE shoulder flex towards ceiling STM to scapula musculature in prone with tenderness to light touch Elbow lacks 25 deg extension due to elbow  contracture Prosthetic consult for L elbow splint for night time use Patient performed with instruction, verbal cues, tactile cues of therapist: goal:increase tissue extensibility, promote proper posture, improve mobility                          PT Education - 05/27/20 0915    Education Details HEP    Person(s) Educated Patient    Methods Explanation    Comprehension Verbalized understanding            PT Short Term Goals - 05/20/20 0925      PT SHORT TERM GOAL #1   Title Patient will be independent in home exercise program to improve strength/mobility for better functional independence with ADLs.    Time 4    Period Weeks    Status Achieved    Target Date 05/14/20      PT SHORT TERM GOAL #2   Title Patient will be able to perform household work/ chores without increase in symptoms.    Time 4    Period Weeks    Status Partially Met   not exacerbated after activity, mayu be during depending on use   Target Date 05/14/20             PT Long Term Goals - 05/20/20 0927      PT LONG TERM GOAL #1   Title Patient will report a worst pain of 3/10 on VAS in left shoulder and elbow  to improve tolerance with ADLs and reduced symptoms with activities.    Baseline at reassessment 7/7: pain can still be higher than 3/10 (caregiver reports pain unchanged, or sometimes worse)    Time 8    Period Weeks    Status On-going    Target Date 06/11/20      PT LONG TERM GOAL #2   Title Patient will demonstrate adequate shoulder ROM and strength to be able to shave and dress independently with pain less than 3/10.    Baseline Achieved    Time 8    Period Weeks    Status Achieved    Target Date 06/11/20      PT LONG TERM GOAL #3   Title Patient will decrease Quick DASH score by > 8 points demonstrating reduced self-reported upper extremity disability.    Time 8    Period Weeks    Status On-going    Target Date 06/11/20                 Plan -  05/27/20 0954    Clinical Impression Statement Patient reports left elbow discomfort and tolerates [place] and e-stim  to left elbow during PROM and STM to left elbow and shoulder musculature. A  Prosthesis was consulted and was present to advise  a elbow splint to assist with improving elbow extension and decrease her pain level. Patient reports minimal pain following treatment    Personal Factors and Comorbidities Age;Comorbidity 1    Examination-Activity Limitations Bathing;Dressing;Carry;Reach Overhead;Sleep    Examination-Participation Restrictions Cleaning;Meal Prep;Community Activity;Yard Work    Stability/Clinical Decision Making Stable/Uncomplicated    Rehab Potential Fair    PT Frequency 2x / week    PT Duration 8 weeks    PT Treatment/Interventions Electrical Stimulation;Cryotherapy;ADLs/Self Care Home Management;Moist Heat;Ultrasound;Gait training;Therapeutic activities;Therapeutic exercise;Patient/family education;Manual techniques;Passive range of motion;Dry needling;Splinting;Taping;Joint Manipulations    PT Next Visit Plan manual therapy    Consulted and Agree with Plan of Care Patient;Family member/caregiver           Patient will benefit from skilled therapeutic intervention in order to improve the following deficits and impairments:  Decreased activity tolerance, Decreased endurance, Decreased range of motion, Decreased strength, Impaired flexibility, Impaired tone, Pain, Impaired UE functional use  Visit Diagnosis: Muscle weakness (generalized)  Limited joint range of motion (ROM)  Stiffness of left shoulder, not elsewhere classified     Problem List Patient Active Problem List   Diagnosis Date Noted  . Left rotator cuff tear 02/08/2018    Alanson Puls, [place] DPT 05/27/2020, 9:56 AM  [place] MAIN [facility] SERVICES [address] [place], [place], [address] Phone: [phone]   Fax:  [phone]  Name: Shannon Donovan MRN: [id number] Date of Birth: 1950/12/21

## 2020-06-01 ENCOUNTER — Ambulatory Visit: Payer: Medicare Other

## 2020-06-01 ENCOUNTER — Other Ambulatory Visit: Payer: Self-pay

## 2020-06-01 ENCOUNTER — Encounter: Payer: Self-pay | Admitting: Physical Therapy

## 2020-06-01 DIAGNOSIS — M2560 Stiffness of unspecified joint, not elsewhere classified: Secondary | ICD-10-CM

## 2020-06-01 DIAGNOSIS — M6281 Muscle weakness (generalized): Secondary | ICD-10-CM | POA: Diagnosis not present

## 2020-06-01 DIAGNOSIS — M25612 Stiffness of left shoulder, not elsewhere classified: Secondary | ICD-10-CM

## 2020-06-01 NOTE — Therapy (Signed)
[place] MAIN [facility] SERVICES [address] [place], [place], [address] Phone: [phone]   Fax:  [phone]  Physical Therapy Treatment  Patient Details  Name: Shannon Donovan MRN: [id number] Date of Birth: January 23, 1951 Referring Provider (PT): shah, [place]   Encounter Date: 06/01/2020   PT End of Session - 06/01/20 0844    Visit Number 13    Number of Visits 17    Date for PT Re-Evaluation 06/11/20    Authorization Type MCR    Authorization Time Period 04/16/20-7/29-21    PT Start Time 0845    PT Stop Time 0930    PT Time Calculation (min) 45 min    Activity Tolerance Patient limited by pain;Patient tolerated treatment well    Behavior During Therapy [facility] for tasks assessed/performed           Past Medical History:  Diagnosis Date  . Adolescent scoliosis   . Anxiety   . Atrial fibrillation ([place])   . Dysrhythmia    A fib  . History of chicken pox   . Hyperlipidemia   . Hyperplastic colon polyp 09/05/2016   Tubular adenoma of colon  . Hypertension    benign/ controlled  . Short-term memory loss   . Shoulder tendinitis    LEFT upper biceps tendonitis, rotator cuff tendinitis  . Stroke ([place]) 48/1856   embolic CVA/ short term memory loss    Past Surgical History:  Procedure Laterality Date  . BACK SURGERY     x2 at age 50 and 28 for scoliosis  . COLONOSCOPY N/A 09/05/2016   Procedure: COLONOSCOPY;  Surgeon: Lollie Sails, MD;  Location: [facility] ENDOSCOPY;  Service: Endoscopy;  Laterality: N/A;  Pt on Coumadin (PT/ INR)  . SHOULDER ARTHROSCOPY WITH OPEN ROTATOR CUFF REPAIR Left 02/08/2018   Procedure: SHOULDER ARTHROSCOPY WITH OPEN ROTATOR CUFF REPAIR;  Surgeon: Corky Mull, MD;  Location: [facility] ORS;  Service: Orthopedics;  Laterality: Left;  . SHOULDER CLOSED REDUCTION Left 05/09/2018   Procedure: CLOSED MANIPULATION SHOULDER  UNDER ANESTHESIA WITH STEROID INJECTION;  Surgeon: Corky Mull, MD;  Location: [place];  Service: Orthopedics;  Laterality: Left;  . TONSILLECTOMY      There were no vitals filed for this visit.   Subjective Assessment - 06/01/20 0848    Subjective Pt stated her whole elbow hurts, feels like she has been running it into a hard surface. No pain at rest today, but with any movement pt reports pain.    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 She is having diffiutly with dressing and bathing with some effort. She is having pain intermittent during activiites.She had rotator cuff surgery L shoulder 2 years ago. She had a second surgery 2 months after the first surgery. She had PT following the left shoulder surgery.02/08/18, then folowed up with manipulation 6/19.    Limitations House hold activities    How long can you sit comfortably? unlimited    How long can you stand comfortably? not sure    How long can you walk comfortably? not sure    Patient Stated Goals to be able to use her left UE for functional activities    Currently in Pain? No/denies   at rest, no pain today          Treatment:  [place] 1.5 cm squared, x 10 mins  to left elbow and arm musculature  E-stim  9.8  volts, 80/150 hz, carrier freq 4000 Hz x 15 mins  MH to L shoulder/elbow during exercises:  Elbow extension PROM and elbow supination PROM x 30 x 3    Elbow lacks 30 deg extension due to elbow contracture, after intervention 23 degrees  Patient performed with instruction, verbal cues, tactile cues of therapist: goal: increase tissue extensibility, promote proper posture, improve mobility    pt response/clinical impression: Pt reported that her pain decreased with movement after intervention, really felt the moist hot pack was beneficial. Pt also demonstrated an improvement in ROM at end of session. The patient would benefit from further skilled PT intervention to continue to progress towards goals.        PT  Education - 06/01/20 0843    Education Details therex form/technique    Person(s) Educated Patient    Methods Explanation;Demonstration    Comprehension Verbalized understanding;Tactile cues required;Returned demonstration            PT Short Term Goals - 05/20/20 0925      PT SHORT TERM GOAL #1   Title Patient will be independent in home exercise program to improve strength/mobility for better functional independence with ADLs.    Time 4    Period Weeks    Status Achieved    Target Date 05/14/20      PT SHORT TERM GOAL #2   Title Patient will be able to perform household work/ chores without increase in symptoms.    Time 4    Period Weeks    Status Partially Met   not exacerbated after activity, mayu be during depending on use   Target Date 05/14/20             PT Long Term Goals - 05/20/20 0927      PT LONG TERM GOAL #1   Title Patient will report a worst pain of 3/10 on VAS in left shoulder and elbow  to improve tolerance with ADLs and reduced symptoms with activities.    Baseline at reassessment 7/7: pain can still be higher than 3/10 (caregiver reports pain unchanged, or sometimes worse)    Time 8    Period Weeks    Status On-going    Target Date 06/11/20      PT LONG TERM GOAL #2   Title Patient will demonstrate adequate shoulder ROM and strength to be able to shave and dress independently with pain less than 3/10.    Baseline Achieved    Time 8    Period Weeks    Status Achieved    Target Date 06/11/20      PT LONG TERM GOAL #3   Title Patient will decrease Quick DASH score by > 8 points demonstrating reduced self-reported upper extremity disability.    Time 8    Period Weeks    Status On-going    Target Date 06/11/20                 Plan - 06/01/20 0843    Clinical Impression Statement Pt reported that her pain decreased with movement after intervention, really felt the moist hot pack was beneficial. Pt also demonstrated an improvement in ROM  at end of session. The patient would benefit from further skilled PT intervention to continue to progress towards goals.    Personal Factors and Comorbidities Age;Comorbidity 1    Examination-Activity Limitations Bathing;Dressing;Carry;Reach Overhead;Sleep    Examination-Participation Restrictions Cleaning;Meal Prep;Community Activity;Valla Leaver Work  Stability/Clinical Decision Making Stable/Uncomplicated    Rehab Potential Fair    PT Frequency 2x / week    PT Duration 8 weeks    PT Treatment/Interventions Electrical Stimulation;Cryotherapy;ADLs/Self Care Home Management;Moist Heat;Ultrasound;Gait training;Therapeutic activities;Therapeutic exercise;Patient/family education;Manual techniques;Passive range of motion;Dry needling;Splinting;Taping;Joint Manipulations    PT Next Visit Plan manual therapy    Consulted and Agree with Plan of Care Patient;Family member/caregiver           Patient will benefit from skilled therapeutic intervention in order to improve the following deficits and impairments:  Decreased activity tolerance, Decreased endurance, Decreased range of motion, Decreased strength, Impaired flexibility, Impaired tone, Pain, Impaired UE functional use  Visit Diagnosis: Muscle weakness (generalized)  Limited joint range of motion (ROM)  Stiffness of left shoulder, not elsewhere classified     Problem List Patient Active Problem List   Diagnosis Date Noted  . Left rotator cuff tear 02/08/2018    Lieutenant Diego PT, DPT 9:22 AM,06/01/20   [place] MAIN [facility] SERVICES [address] [place], [place], [address] Phone: [phone]   Fax:  [phone]  Name: Aribelle Mccosh MRN: [id number] Date of Birth: 09-Sep-1951

## 2020-06-03 ENCOUNTER — Encounter: Payer: Self-pay | Admitting: Physical Therapy

## 2020-06-03 ENCOUNTER — Ambulatory Visit: Payer: Medicare Other

## 2020-06-03 ENCOUNTER — Other Ambulatory Visit: Payer: Self-pay

## 2020-06-03 DIAGNOSIS — M2560 Stiffness of unspecified joint, not elsewhere classified: Secondary | ICD-10-CM

## 2020-06-03 DIAGNOSIS — M6281 Muscle weakness (generalized): Secondary | ICD-10-CM

## 2020-06-03 DIAGNOSIS — M25612 Stiffness of left shoulder, not elsewhere classified: Secondary | ICD-10-CM

## 2020-06-03 NOTE — Therapy (Signed)
[place] MAIN [facility] SERVICES [address] [place], [place], [address] Phone: [phone]   Fax:  [phone]  Physical Therapy Treatment  Patient Details  Name: Shannon Donovan MRN: [id number] Date of Birth: Jul 18, 1951 Referring Provider (PT): shah, [place]   Encounter Date: 06/03/2020   PT End of Session - 06/03/20 0912    Visit Number 14    Number of Visits 17    Date for PT Re-Evaluation 06/11/20    Authorization Type MCR    Authorization Time Period 04/16/20-7/29-21    PT Start Time 0845    PT Stop Time 0929    PT Time Calculation (min) 44 min    Activity Tolerance Patient tolerated treatment well    Behavior During Therapy [facility] for tasks assessed/performed           Past Medical History:  Diagnosis Date  . Adolescent scoliosis   . Anxiety   . Atrial fibrillation ([place])   . Dysrhythmia    A fib  . History of chicken pox   . Hyperlipidemia   . Hyperplastic colon polyp 09/05/2016   Tubular adenoma of colon  . Hypertension    benign/ controlled  . Short-term memory loss   . Shoulder tendinitis    LEFT upper biceps tendonitis, rotator cuff tendinitis  . Stroke ([place]) 28/3151   embolic CVA/ short term memory loss    Past Surgical History:  Procedure Laterality Date  . BACK SURGERY     x2 at age 44 and 41 for scoliosis  . COLONOSCOPY N/A 09/05/2016   Procedure: COLONOSCOPY;  Surgeon: Lollie Sails, MD;  Location: [facility] ENDOSCOPY;  Service: Endoscopy;  Laterality: N/A;  Pt on Coumadin (PT/ INR)  . SHOULDER ARTHROSCOPY WITH OPEN ROTATOR CUFF REPAIR Left 02/08/2018   Procedure: SHOULDER ARTHROSCOPY WITH OPEN ROTATOR CUFF REPAIR;  Surgeon: Corky Mull, MD;  Location: [facility] ORS;  Service: Orthopedics;  Laterality: Left;  . SHOULDER CLOSED REDUCTION Left 05/09/2018   Procedure: CLOSED MANIPULATION SHOULDER  UNDER ANESTHESIA WITH STEROID INJECTION;  Surgeon: Corky Mull, MD;  Location: [place];  Service:  Orthopedics;  Laterality: Left;  . TONSILLECTOMY      There were no vitals filed for this visit.   Subjective Assessment - 06/03/20 0910    Subjective Patient reported no pain at start of session. 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 She is having diffiutly with dressing and bathing with some effort. She is having pain intermittent during activiites.She had rotator cuff surgery L shoulder 2 years ago. She had a second surgery 2 months after the first surgery. She had PT following the left shoulder surgery.02/08/18, then folowed up with manipulation 6/19.    Limitations House hold activities    How long can you sit comfortably? unlimited    How long can you stand comfortably? not sure    How long can you walk comfortably? not sure    Patient Stated Goals to be able to use her left UE for functional activities    Currently in Pain? No/denies           Treatment: [place] 1.5 cm squared, x 10 mins to left elbow and arm musculature E-stim  9.8  volts, 80/150 hz, carrier freq 4000 Hz x 20 mins MH to L shoulder/elbow during exercises:  PROM to left shoulder flex / abd/ ER/ IR, L elbow ext and supination and pronation x 10  Elbow extension PROM and elbow supination PROM x 30 x 3  Supine bench press with 1 lb rod BUE shoulder flex towards ceiling  Elbow lacks 25 deg extension due to elbow contracture  Patient performed with instruction, verbal cues, tactile cues of therapist: goal: increase tissue extensibility, promote proper posture, improve mobility  Pt response/clinical impression:  The patient had good tolerance to activity today. Some mild improvement in ROM noted after interventions, pt reported ongoing pain with elbow movement. The patient is eagerly awaiting her splint. The patient would benefit from further skilled PT intervention to continue to progress towards  goals.      PT Education - 06/03/20 0911    Education Details therex form/technique    Person(s) Educated Patient    Methods Explanation;Demonstration    Comprehension Returned demonstration;Verbal cues required;Verbalized understanding;Tactile cues required;Need further instruction            PT Short Term Goals - 05/20/20 0925      PT SHORT TERM GOAL #1   Title Patient will be independent in home exercise program to improve strength/mobility for better functional independence with ADLs.    Time 4    Period Weeks    Status Achieved    Target Date 05/14/20      PT SHORT TERM GOAL #2   Title Patient will be able to perform household work/ chores without increase in symptoms.    Time 4    Period Weeks    Status Partially Met   not exacerbated after activity, mayu be during depending on use   Target Date 05/14/20             PT Long Term Goals - 05/20/20 0927      PT LONG TERM GOAL #1   Title Patient will report a worst pain of 3/10 on VAS in left shoulder and elbow  to improve tolerance with ADLs and reduced symptoms with activities.    Baseline at reassessment 7/7: pain can still be higher than 3/10 (caregiver reports pain unchanged, or sometimes worse)    Time 8    Period Weeks    Status On-going    Target Date 06/11/20      PT LONG TERM GOAL #2   Title Patient will demonstrate adequate shoulder ROM and strength to be able to shave and dress independently with pain less than 3/10.    Baseline Achieved    Time 8    Period Weeks    Status Achieved    Target Date 06/11/20      PT LONG TERM GOAL #3   Title Patient will decrease Quick DASH score by > 8 points demonstrating reduced self-reported upper extremity disability.    Time 8    Period Weeks    Status On-going    Target Date 06/11/20                 Plan - 06/03/20 0911    Clinical Impression Statement The patient had good tolerance to activity today. Some mild improvement in ROM noted after  interventions, pt reported ongoing pain with elbow movement. The patient is eagerly awaiting her splint. The patient would benefit from further skilled PT intervention to continue to progress towards goals.    Personal Factors and Comorbidities Age;Comorbidity 1    Examination-Activity Limitations Bathing;Dressing;Carry;Reach Overhead;Sleep    Examination-Participation Restrictions Cleaning;Meal Prep;Community Activity;Valla Leaver Work  Stability/Clinical Decision Making Stable/Uncomplicated    Rehab Potential Fair    PT Frequency 2x / week    PT Duration 8 weeks    PT Treatment/Interventions Electrical Stimulation;Cryotherapy;ADLs/Self Care Home Management;Moist Heat;Ultrasound;Gait training;Therapeutic activities;Therapeutic exercise;Patient/family education;Manual techniques;Passive range of motion;Dry needling;Splinting;Taping;Joint Manipulations    PT Next Visit Plan manual therapy    Consulted and Agree with Plan of Care Patient;Family member/caregiver           Patient will benefit from skilled therapeutic intervention in order to improve the following deficits and impairments:  Decreased activity tolerance, Decreased endurance, Decreased range of motion, Decreased strength, Impaired flexibility, Impaired tone, Pain, Impaired UE functional use  Visit Diagnosis: Muscle weakness (generalized)  Limited joint range of motion (ROM)  Stiffness of left shoulder, not elsewhere classified     Problem List Patient Active Problem List   Diagnosis Date Noted  . Left rotator cuff tear 02/08/2018    Lieutenant Diego PT, DPT 9:31 AM,06/03/20   [place] MAIN [facility] SERVICES [address] [place], [place], [address] Phone: [phone]   Fax:  [phone]  Name: Genia Perin MRN: [id number] Date of Birth: 01/25/1951

## 2020-06-08 ENCOUNTER — Encounter: Payer: Self-pay | Admitting: Physical Therapy

## 2020-06-08 ENCOUNTER — Ambulatory Visit: Payer: Medicare Other | Admitting: Physical Therapy

## 2020-06-08 ENCOUNTER — Other Ambulatory Visit: Payer: Self-pay

## 2020-06-08 DIAGNOSIS — M6281 Muscle weakness (generalized): Secondary | ICD-10-CM

## 2020-06-08 DIAGNOSIS — M25612 Stiffness of left shoulder, not elsewhere classified: Secondary | ICD-10-CM

## 2020-06-08 DIAGNOSIS — M2560 Stiffness of unspecified joint, not elsewhere classified: Secondary | ICD-10-CM

## 2020-06-08 NOTE — Therapy (Signed)
[place] MAIN [facility] SERVICES [address] [place], [place], [address] Phone: [phone]   Fax:  [phone]  Physical Therapy Treatment  Patient Details  Name: Shannon Donovan MRN: [id number] Date of Birth: 13-Dec-1950 Referring Provider (PT): shah, [place]   Encounter Date: 06/08/2020   PT End of Session - 06/08/20 0933    Visit Number 15    Number of Visits 17    Date for PT Re-Evaluation 06/11/20    Authorization Type MCR    Authorization Time Period 04/16/20-7/29-21    PT Start Time 0845    PT Stop Time 0925    PT Time Calculation (min) 40 min    Activity Tolerance Patient tolerated treatment well    Behavior During Therapy [facility] for tasks assessed/performed           Past Medical History:  Diagnosis Date   Adolescent scoliosis    Anxiety    Atrial fibrillation ([place])    Dysrhythmia    A fib   History of chicken pox    Hyperlipidemia    Hyperplastic colon polyp 09/05/2016   Tubular adenoma of colon   Hypertension    benign/ controlled   Short-term memory loss    Shoulder tendinitis    LEFT upper biceps tendonitis, rotator cuff tendinitis   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    Past Surgical History:  Procedure Laterality Date   BACK SURGERY     x2 at age 70 and 30 for scoliosis   COLONOSCOPY N/A 09/05/2016   Procedure: COLONOSCOPY;  Surgeon: Lollie Sails, MD;  Location: [facility] ENDOSCOPY;  Service: Endoscopy;  Laterality: N/A;  Pt on Coumadin (PT/ INR)   SHOULDER ARTHROSCOPY WITH OPEN ROTATOR CUFF REPAIR Left 02/08/2018   Procedure: SHOULDER ARTHROSCOPY WITH OPEN ROTATOR CUFF REPAIR;  Surgeon: Corky Mull, MD;  Location: [facility] ORS;  Service: Orthopedics;  Laterality: Left;   SHOULDER CLOSED REDUCTION Left 05/09/2018   Procedure: CLOSED MANIPULATION SHOULDER  UNDER ANESTHESIA WITH STEROID INJECTION;  Surgeon: Corky Mull, MD;  Location: [place];  Service:  Orthopedics;  Laterality: Left;   TONSILLECTOMY      There were no vitals filed for this visit.   Subjective Assessment - 06/08/20 0932    Subjective Patient reported pain at start of session to l elbow.. 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 She is having diffiutly with dressing and bathing with some effort. She is having pain intermittent during activiites.She had rotator cuff surgery L shoulder 2 years ago. She had a second surgery 2 months after the first surgery. She had PT following the left shoulder surgery.02/08/18, then folowed up with manipulation 6/19.    Limitations House hold activities    How long can you sit comfortably? unlimited    How long can you stand comfortably? not sure    How long can you walk comfortably? not sure    Patient Stated Goals to be able to use her left UE for functional activities    Currently in Pain? Yes    Pain Score 3     Pain Location Elbow    Pain Orientation Left    Pain Descriptors / Indicators Aching    Pain Type Chronic pain    Pain Onset 1 to 4  weeks ago           Treatment: [place] 1.5 cmsquared,x 10 mins to left elbow and arm musculature E-stim9.8volts, 80/150 hz, carrier freq 4000 Hz x 20 mins MH to L shoulder/elbowduring exercises: PROM to left shoulderflex / abd/ ER/ IR, L elbow ext and supination and pronationx 10 Scapula mobilization L in sidelying ant/post, medial / lateral x 10 each direction Soft tissue mobility to left scapula musculature Elbow extension PROM and elbow supination PROM x 30 x 3  Supine bench press with 1 lb rod BUE shoulder flex towards ceiling STM to scapula musculature in prone with tenderness to light touch Elbow lacks 25 deg extension due to elbow contracture Prosthetic consult for L elbow splint for night time use Patient performed with instruction, verbal cues,  tactile cues of therapist: goal:increase tissue extensibility, promote proper posture, improve mobility                          PT Education - 06/08/20 0932    Education Details HEP    Person(s) Educated Patient    Methods Explanation    Comprehension Returned demonstration;Need further instruction            PT Short Term Goals - 05/20/20 0925      PT SHORT TERM GOAL #1   Title Patient will be independent in home exercise program to improve strength/mobility for better functional independence with ADLs.    Time 4    Period Weeks    Status Achieved    Target Date 05/14/20      PT SHORT TERM GOAL #2   Title Patient will be able to perform household work/ chores without increase in symptoms.    Time 4    Period Weeks    Status Partially Met   not exacerbated after activity, mayu be during depending on use   Target Date 05/14/20             PT Long Term Goals - 05/20/20 0927      PT LONG TERM GOAL #1   Title Patient will report a worst pain of 3/10 on VAS in left shoulder and elbow  to improve tolerance with ADLs and reduced symptoms with activities.    Baseline at reassessment 7/7: pain can still be higher than 3/10 (caregiver reports pain unchanged, or sometimes worse)    Time 8    Period Weeks    Status On-going    Target Date 06/11/20      PT LONG TERM GOAL #2   Title Patient will demonstrate adequate shoulder ROM and strength to be able to shave and dress independently with pain less than 3/10.    Baseline Achieved    Time 8    Period Weeks    Status Achieved    Target Date 06/11/20      PT LONG TERM GOAL #3   Title Patient will decrease Quick DASH score by > 8 points demonstrating reduced self-reported upper extremity disability.    Time 8    Period Weeks    Status On-going    Target Date 06/11/20                 Plan - 06/08/20 0933    Clinical Impression Statement Patient has decreased L elbow ROM and L elbow strength  and L elbow pain. She responds to e-stim and [place] to L elbow to decrease pain. She performs L elbow strengthening with  assist and VC for sequencing. Patient will continue to benefit from skilled PT to improve functional mobility .    Personal Factors and Comorbidities Age;Comorbidity 1    Examination-Activity Limitations Bathing;Dressing;Carry;Reach Overhead;Sleep    Examination-Participation Restrictions Cleaning;Meal Prep;Community Activity;Yard Work    Stability/Clinical Decision Making Stable/Uncomplicated    Rehab Potential Fair    PT Frequency 2x / week    PT Duration 8 weeks    PT Treatment/Interventions Electrical Stimulation;Cryotherapy;ADLs/Self Care Home Management;Moist Heat;Ultrasound;Gait training;Therapeutic activities;Therapeutic exercise;Patient/family education;Manual techniques;Passive range of motion;Dry needling;Splinting;Taping;Joint Manipulations    PT Next Visit Plan manual therapy    Consulted and Agree with Plan of Care Patient;Family member/caregiver           Patient will benefit from skilled therapeutic intervention in order to improve the following deficits and impairments:  Decreased activity tolerance, Decreased endurance, Decreased range of motion, Decreased strength, Impaired flexibility, Impaired tone, Pain, Impaired UE functional use  Visit Diagnosis: Limited joint range of motion (ROM)  Muscle weakness (generalized)  Stiffness of left shoulder, not elsewhere classified     Problem List Patient Active Problem List   Diagnosis Date Noted   Left rotator cuff tear 02/08/2018    Alanson Puls, PT DPT 06/08/2020, 9:35 AM  [place] MAIN [facility] SERVICES [address] [place], [place], [address] Phone: [phone]   Fax:  [phone]  Name: Shannon Donovan MRN: [id number] Date of Birth: [birth year]

## 2020-06-10 ENCOUNTER — Encounter: Payer: Self-pay | Admitting: Physical Therapy

## 2020-06-10 ENCOUNTER — Ambulatory Visit: Payer: Medicare Other | Admitting: Physical Therapy

## 2020-06-10 ENCOUNTER — Other Ambulatory Visit: Payer: Self-pay

## 2020-06-10 DIAGNOSIS — M6281 Muscle weakness (generalized): Secondary | ICD-10-CM

## 2020-06-10 DIAGNOSIS — M25612 Stiffness of left shoulder, not elsewhere classified: Secondary | ICD-10-CM

## 2020-06-10 DIAGNOSIS — M2560 Stiffness of unspecified joint, not elsewhere classified: Secondary | ICD-10-CM

## 2020-06-10 NOTE — Therapy (Addendum)
[place] MAIN [facility] SERVICES [address] [place], [place], [address] Phone: [phone]   Fax:  [phone]  Physical Therapy Treatment  Patient Details  Name: Shannon Donovan MRN: [id number] Date of Birth: 11/29/1950 Referring Provider (PT): shah, [place]   Encounter Date: 06/10/2020   PT End of Session - 06/10/20 0921    Visit Number 16    Number of Visits 17    Date for PT Re-Evaluation 06/11/20    Authorization Type MCR    Authorization Time Period 04/16/20-7/29-21    PT Start Time 0845    PT Stop Time 0930    PT Time Calculation (min) 45 min    Activity Tolerance Patient tolerated treatment well    Behavior During Therapy [facility] for tasks assessed/performed           Past Medical History:  Diagnosis Date  . Adolescent scoliosis   . Anxiety   . Atrial fibrillation ([place])   . Dysrhythmia    A fib  . History of chicken pox   . Hyperlipidemia   . Hyperplastic colon polyp 09/05/2016   Tubular adenoma of colon  . Hypertension    benign/ controlled  . Short-term memory loss   . Shoulder tendinitis    LEFT upper biceps tendonitis, rotator cuff tendinitis  . 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 BACK SURGERY     x2 at age 14 and 3 for scoliosis  . COLONOSCOPY N/A 09/05/2016   Procedure: COLONOSCOPY;  Surgeon: Lollie Sails, MD;  Location: [facility] ENDOSCOPY;  Service: Endoscopy;  Laterality: N/A;  Pt on Coumadin (PT/ INR)  . SHOULDER ARTHROSCOPY WITH OPEN ROTATOR CUFF REPAIR Left 02/08/2018   Procedure: SHOULDER ARTHROSCOPY WITH OPEN ROTATOR CUFF REPAIR;  Surgeon: Corky Mull, MD;  Location: [facility] ORS;  Service: Orthopedics;  Laterality: Left;  . SHOULDER CLOSED REDUCTION Left 05/09/2018   Procedure: CLOSED MANIPULATION SHOULDER  UNDER ANESTHESIA WITH STEROID INJECTION;  Surgeon: Corky Mull, MD;  Location: [place];  Service:  Orthopedics;  Laterality: Left;  . TONSILLECTOMY      There were no vitals filed for this visit.   Subjective Assessment - 06/10/20 0919    Subjective Patient reported pain at start of session to l elbow.. 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 She is having diffiutly with dressing and bathing with some effort. She is having pain intermittent during activiites.She had rotator cuff surgery L shoulder 2 years ago. She had a second surgery 2 months after the first surgery. She had PT following the left shoulder surgery.02/08/18, then folowed up with manipulation 6/19.    Limitations House hold activities    How long can you sit comfortably? unlimited    How long can you stand comfortably? not sure    How long can you walk comfortably? not sure    Patient Stated Goals to be able to use her left UE for functional activities    Currently in Pain? Yes    Pain Score 3     Pain Location Elbow    Pain Orientation Left    Pain Descriptors / Indicators Aching    Pain Type Chronic pain    Pain Onset 1 to 4  weeks ago    Pain Frequency Intermittent    Aggravating Factors  hanging straight    Pain Relieving Factors supporting it on her trunk    Effect of Pain on Daily Activities difficult          Treatment: Patient was instructed in donning and doffing L elbow brace for night time use.  Patients brace was set at 30 deg flex to assist with extension contracture. Patient practiced donning and doffing brace with assist for getting the straps on correctly.  Patient performed with instruction, verbal cues, tactile cues of therapist: goal: increase tissue extensibility, promote proper posture, improve mobility                           PT Education - 06/10/20 0921    Education Details donning and doffing the elbow splint    Person(s) Educated Patient      Methods Explanation    Comprehension Returned demonstration;Need further instruction;Tactile cues required;Verbal cues required            PT Short Term Goals - 05/20/20 0925      PT SHORT TERM GOAL #1   Title Patient will be independent in home exercise program to improve strength/mobility for better functional independence with ADLs.    Time 4    Period Weeks    Status Achieved    Target Date 05/14/20      PT SHORT TERM GOAL #2   Title Patient will be able to perform household work/ chores without increase in symptoms.    Time 4    Period Weeks    Status Partially Met   not exacerbated after activity, mayu be during depending on use   Target Date 05/14/20             PT Long Term Goals - 06/11/20 1635      PT LONG TERM GOAL #1   Title Patient will report a worst pain of 3/10 on VAS in left shoulder and elbow  to improve tolerance with ADLs and reduced symptoms with activities.    Baseline at reassessment 7/7: pain can still be higher than 3/10 (caregiver reports pain unchanged, or sometimes worse)    Time 8    Period Weeks    Status On-going    Target Date 08/06/20      PT LONG TERM GOAL #2   Title Patient will demonstrate adequate shoulder ROM and strength to be able to shave and dress independently with pain less than 3/10.    Baseline Achieved    Time 8    Period Weeks    Status Achieved      PT LONG TERM GOAL #3   Title Patient will decrease Quick DASH score by > 8 points demonstrating reduced self-reported upper extremity disability.    Time 8    Period Weeks    Status On-going    Target Date 08/06/20                 Plan - 06/10/20 5320    Clinical Impression Statement Patient was seen for fitting of L elbow brace for extension contracture. She was educated with donning and doffing brace and practiced getting it on and off x 3 reps. Patient will continue to benefit from skilled PT to improve mobility and decreased elbow pain.      Personal  Factors and Comorbidities Age;Comorbidity 1    Examination-Activity Limitations Bathing;Dressing;Carry;Reach Overhead;Sleep  Examination-Participation Restrictions Cleaning;Meal Prep;Community Activity;Yard Work    Stability/Clinical Decision Making Stable/Uncomplicated    Rehab Potential Fair    PT Frequency 2x / week    PT Duration 8 weeks    PT Treatment/Interventions Electrical Stimulation;Cryotherapy;ADLs/Self Care Home Management;Moist Heat;Ultrasound;Gait training;Therapeutic activities;Therapeutic exercise;Patient/family education;Manual techniques;Passive range of motion;Dry needling;Splinting;Taping;Joint Manipulations    PT Next Visit Plan manual therapy    Consulted and Agree with Plan of Care Patient;Family member/caregiver           Patient will benefit from skilled therapeutic intervention in order to improve the following deficits and impairments:  Decreased activity tolerance, Decreased endurance, Decreased range of motion, Decreased strength, Impaired flexibility, Impaired tone, Pain, Impaired UE functional use  Visit Diagnosis: Limited joint range of motion (ROM)  Muscle weakness (generalized)  Stiffness of left shoulder, not elsewhere classified     Problem List Patient Active Problem List   Diagnosis Date Noted  . Left rotator cuff tear 02/08/2018    Alanson Puls, [place] DPT 06/11/2020, 4:36 PM  [place] MAIN [facility] SERVICES [address] [place], [place], [address] Phone: [phone]   Fax:  [phone]  Name: Shannon Donovan MRN: [id number] Date of Birth: 1951/06/16

## 2020-06-11 NOTE — Addendum Note (Signed)
Addended by: Alanson Puls on: 06/11/2020 04:38 PM   Modules accepted: Orders

## 2020-06-15 ENCOUNTER — Encounter: Payer: Self-pay | Admitting: Physical Therapy

## 2020-06-15 ENCOUNTER — Other Ambulatory Visit: Payer: Self-pay

## 2020-06-15 ENCOUNTER — Ambulatory Visit: Payer: Medicare Other | Attending: Neurology | Admitting: Physical Therapy

## 2020-06-15 DIAGNOSIS — M6281 Muscle weakness (generalized): Secondary | ICD-10-CM

## 2020-06-15 DIAGNOSIS — M2560 Stiffness of unspecified joint, not elsewhere classified: Secondary | ICD-10-CM | POA: Diagnosis present

## 2020-06-15 DIAGNOSIS — M25612 Stiffness of left shoulder, not elsewhere classified: Secondary | ICD-10-CM | POA: Insufficient documentation

## 2020-06-15 NOTE — Therapy (Signed)
[place] MAIN [facility] SERVICES [address] [place], [place], [address] Phone: [phone]   Fax:  [phone]  Physical Therapy Treatment  Patient Details  Name: Shannon Donovan MRN: [id number] Date of Birth: 05-Mar-1951 Referring Provider (PT): shah, [place]   Encounter Date: 06/15/2020   PT End of Session - 06/15/20 0905    Visit Number 17    Number of Visits 17    Date for PT Re-Evaluation 06/11/20    Authorization Type MCR    Authorization Time Period 04/16/20-7/29-21    PT Start Time 0900    PT Stop Time 0945    PT Time Calculation (min) 45 min    Activity Tolerance Patient tolerated treatment well    Behavior During Therapy [facility] for tasks assessed/performed           Past Medical History:  Diagnosis Date  . Adolescent scoliosis   . Anxiety   . Atrial fibrillation ([place])   . Dysrhythmia    A fib  . History of chicken pox   . Hyperlipidemia   . Hyperplastic colon polyp 09/05/2016   Tubular adenoma of colon  . Hypertension    benign/ controlled  . Short-term memory loss   . Shoulder tendinitis    LEFT upper biceps tendonitis, rotator cuff tendinitis  . 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 BACK SURGERY     x2 at age 76 and 30 for scoliosis  . COLONOSCOPY N/A 09/05/2016   Procedure: COLONOSCOPY;  Surgeon: Lollie Sails, MD;  Location: [facility] ENDOSCOPY;  Service: Endoscopy;  Laterality: N/A;  Pt on Coumadin (PT/ INR)  . SHOULDER ARTHROSCOPY WITH OPEN ROTATOR CUFF REPAIR Left 02/08/2018   Procedure: SHOULDER ARTHROSCOPY WITH OPEN ROTATOR CUFF REPAIR;  Surgeon: Corky Mull, MD;  Location: [facility] ORS;  Service: Orthopedics;  Laterality: Left;  . SHOULDER CLOSED REDUCTION Left 05/09/2018   Procedure: CLOSED MANIPULATION SHOULDER  UNDER ANESTHESIA WITH STEROID INJECTION;  Surgeon: Corky Mull, MD;  Location: [place];  Service: Orthopedics;   Laterality: Left;  . TONSILLECTOMY      There were no vitals filed for this visit.   Subjective Assessment - 06/15/20 0904    Subjective Patient reports no  pain at start of session to l elbow.. she is wearing L elbow brace at night.    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 She is having diffiutly with dressing and bathing with some effort. She is having pain intermittent during activiites.She had rotator cuff surgery L shoulder 2 years ago. She had a second surgery 2 months after the first surgery. She had PT following the left shoulder surgery.02/08/18, then folowed up with manipulation 6/19.    Limitations House hold activities    How long can you sit comfortably? unlimited    How long can you stand comfortably? not sure    How long can you walk comfortably? not sure    Patient Stated Goals to be able to use her left UE for functional activities    Pain Onset 1 to 4 weeks ago            Treatment: Left elbow ER x 15  left elbow flex/ext with 1 lb rod x 15  Shoulder bench press with 1 lb  rod x 15 Shoulder protraction x 15 in supine with 1 lb  Shoulder retraction in sitting x 15  Shoulder flex x 15 with 1 lb rod  [place] x 5 mins  UBE x 5 mins    Patient performed with instruction, verbal cues, tactile cues of therapist: goal: increase tissue extensibility, promote proper posture, improve mobility                       PT Education - 06/15/20 0905    Education Details HEP    Person(s) Educated Patient    Methods Explanation    Comprehension Verbalized understanding            PT Short Term Goals - 05/20/20 0925      PT SHORT TERM GOAL #1   Title Patient will be independent in home exercise program to improve strength/mobility for better functional independence with ADLs.    Time 4    Period Weeks    Status Achieved    Target Date 05/14/20      PT  SHORT TERM GOAL #2   Title Patient will be able to perform household work/ chores without increase in symptoms.    Time 4    Period Weeks    Status Partially Met   not exacerbated after activity, mayu be during depending on use   Target Date 05/14/20             PT Long Term Goals - 06/11/20 1635      PT LONG TERM GOAL #1   Title Patient will report a worst pain of 3/10 on VAS in left shoulder and elbow  to improve tolerance with ADLs and reduced symptoms with activities.    Baseline at reassessment 7/7: pain can still be higher than 3/10 (caregiver reports pain unchanged, or sometimes worse)    Time 8    Period Weeks    Status On-going    Target Date 08/06/20      PT LONG TERM GOAL #2   Title Patient will demonstrate adequate shoulder ROM and strength to be able to shave and dress independently with pain less than 3/10.    Baseline Achieved    Time 8    Period Weeks    Status Achieved      PT LONG TERM GOAL #3   Title Patient will decrease Quick DASH score by > 8 points demonstrating reduced self-reported upper extremity disability.    Time 8    Period Weeks    Status On-going    Target Date 08/06/20                 Plan - 06/15/20 0906    Clinical Impression Statement Patient reports continued LUE discomfort but it is much better; Patient reports that she is still doing scapula retraction and HEP, and light strengthening exercise to improve LUE posture and shoulder positioning. Patient reports no increase in LUE pain during exercise. She would benefit from additional skilled PT intervention to reduce pain with ADLs.   Personal Factors and Comorbidities Age;Comorbidity 1    Examination-Activity Limitations Bathing;Dressing;Carry;Reach Overhead;Sleep    Examination-Participation Restrictions Cleaning;Meal Prep;Community Activity;Yard Work    Stability/Clinical Decision Making Stable/Uncomplicated    Rehab Potential Fair    PT Frequency 2x / week    PT Duration 8  weeks    PT Treatment/Interventions Electrical Stimulation;Cryotherapy;ADLs/Self Care Home Management;Moist Heat;Ultrasound;Gait training;Therapeutic activities;Therapeutic exercise;Patient/family education;Manual techniques;Passive range of motion;Dry needling;Splinting;Taping;Joint Manipulations  PT Next Visit Plan manual therapy    Consulted and Agree with Plan of Care Patient;Family member/caregiver           Patient will benefit from skilled therapeutic intervention in order to improve the following deficits and impairments:  Decreased activity tolerance, Decreased endurance, Decreased range of motion, Decreased strength, Impaired flexibility, Impaired tone, Pain, Impaired UE functional use  Visit Diagnosis: Limited joint range of motion (ROM)  Muscle weakness (generalized)  Stiffness of left shoulder, not elsewhere classified     Problem List Patient Active Problem List   Diagnosis Date Noted  . Left rotator cuff tear 02/08/2018    Alanson Puls, [place] DPT 06/15/2020, 11:49 AM  [place] MAIN [facility] SERVICES [address] [place], [place], [address] Phone: [phone]   Fax:  [phone]  Name: Shannon Donovan MRN: [id number] Date of Birth: 11-30-1950

## 2020-06-17 ENCOUNTER — Encounter: Payer: Self-pay | Admitting: Physical Therapy

## 2020-06-17 ENCOUNTER — Ambulatory Visit: Payer: Medicare Other | Admitting: Physical Therapy

## 2020-06-17 ENCOUNTER — Other Ambulatory Visit: Payer: Self-pay

## 2020-06-17 DIAGNOSIS — M2560 Stiffness of unspecified joint, not elsewhere classified: Secondary | ICD-10-CM

## 2020-06-17 DIAGNOSIS — M6281 Muscle weakness (generalized): Secondary | ICD-10-CM

## 2020-06-17 DIAGNOSIS — M25612 Stiffness of left shoulder, not elsewhere classified: Secondary | ICD-10-CM

## 2020-06-17 NOTE — Therapy (Signed)
[place] MAIN [facility] SERVICES [address] [place], [place], [address] Phone: [phone]   Fax:  [phone]  Physical Therapy Treatment  Patient Details  Name: Shannon Donovan MRN: [id number] Date of Birth: September 29, 1951 Referring Provider (PT): shah, [place]   Encounter Date: 06/17/2020   PT End of Session - 06/17/20 0932    Visit Number 18    Number of Visits 34    Date for PT Re-Evaluation 08/06/20    Authorization Type MCR    Authorization Time Period 04/16/20-7/29-21    PT Start Time 0905    PT Stop Time 0945    PT Time Calculation (min) 40 min    Activity Tolerance Patient tolerated treatment well    Behavior During Therapy [facility] for tasks assessed/performed           Past Medical History:  Diagnosis Date  . Adolescent scoliosis   . Anxiety   . Atrial fibrillation ([place])   . Dysrhythmia    A fib  . History of chicken pox   . Hyperlipidemia   . Hyperplastic colon polyp 09/05/2016   Tubular adenoma of colon  . Hypertension    benign/ controlled  . Short-term memory loss   . Shoulder tendinitis    LEFT upper biceps tendonitis, rotator cuff tendinitis  . 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 BACK SURGERY     x2 at age 64 and 35 for scoliosis  . COLONOSCOPY N/A 09/05/2016   Procedure: COLONOSCOPY;  Surgeon: Lollie Sails, MD;  Location: [facility] ENDOSCOPY;  Service: Endoscopy;  Laterality: N/A;  Pt on Coumadin (PT/ INR)  . SHOULDER ARTHROSCOPY WITH OPEN ROTATOR CUFF REPAIR Left 02/08/2018   Procedure: SHOULDER ARTHROSCOPY WITH OPEN ROTATOR CUFF REPAIR;  Surgeon: Corky Mull, MD;  Location: [facility] ORS;  Service: Orthopedics;  Laterality: Left;  . SHOULDER CLOSED REDUCTION Left 05/09/2018   Procedure: CLOSED MANIPULATION SHOULDER  UNDER ANESTHESIA WITH STEROID INJECTION;  Surgeon: Corky Mull, MD;  Location: [place];  Service: Orthopedics;   Laterality: Left;  . TONSILLECTOMY      There were no vitals filed for this visit.   Subjective Assessment - 06/17/20 0930    Subjective Patient reports no  pain at start of session to l elbow.. she is wearing L elbow brace at night.    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 She is having diffiutly with dressing and bathing with some effort. She is having pain intermittent during activiites.She had rotator cuff surgery L shoulder 2 years ago. She had a second surgery 2 months after the first surgery. She had PT following the left shoulder surgery.02/08/18, then folowed up with manipulation 6/19.    Limitations House hold activities    How long can you sit comfortably? unlimited    How long can you stand comfortably? not sure    How long can you walk comfortably? not sure    Patient Stated Goals to be able to use her left UE for functional activities    Currently in Pain? No/denies    Pain Score 0-No pain    Pain Onset 1 to 4 weeks ago               Treatment: [place] to L elbow x  7 mins at 1.2 cm squarred Left elbow ER x 15  left elbow flex/ext with 1 lb rod x 15  Shoulder bench press with 1 lb rod x 15 Shoulder protraction x 15 in supine with 1 lb  Shoulder retraction in sitting x 15  Shoulder flex x 15 with 1 lb rod  [place] x 5 mins  UBE x 5 mins   Patient performed with instruction, verbal cues, tactile cues of therapist: goal:increase tissue extensibility, promote proper posture, improve mobility                       PT Education - 06/17/20 0930    Education Details HEP    Person(s) Educated Patient    Methods Explanation    Comprehension Returned demonstration;Need further instruction            PT Short Term Goals - 05/20/20 0925      PT SHORT TERM GOAL #1   Title Patient will be independent in home exercise program to improve strength/mobility for  better functional independence with ADLs.    Time 4    Period Weeks    Status Achieved    Target Date 05/14/20      PT SHORT TERM GOAL #2   Title Patient will be able to perform household work/ chores without increase in symptoms.    Time 4    Period Weeks    Status Partially Met   not exacerbated after activity, mayu be during depending on use   Target Date 05/14/20             PT Long Term Goals - 06/11/20 1635      PT LONG TERM GOAL #1   Title Patient will report a worst pain of 3/10 on VAS in left shoulder and elbow  to improve tolerance with ADLs and reduced symptoms with activities.    Baseline at reassessment 7/7: pain can still be higher than 3/10 (caregiver reports pain unchanged, or sometimes worse)    Time 8    Period Weeks    Status On-going    Target Date 08/06/20      PT LONG TERM GOAL #2   Title Patient will demonstrate adequate shoulder ROM and strength to be able to shave and dress independently with pain less than 3/10.    Baseline Achieved    Time 8    Period Weeks    Status Achieved      PT LONG TERM GOAL #3   Title Patient will decrease Quick DASH score by > 8 points demonstrating reduced self-reported upper extremity disability.    Time 8    Period Weeks    Status On-going    Target Date 08/06/20                 Plan - 06/17/20 0933    Clinical Impression Statement Patient reports continued LUE discomfort but it is much better; Patient reports that she is still doing scapula retraction and HEP, and light strengthening exercise to improve LUE posture and shoulder positioning. Patient reports no increase in LUE pain during exercise. She would benefit from additional skilled PT intervention to reduce pain with ADLs.    Personal Factors and Comorbidities Age;Comorbidity 1    Examination-Activity Limitations Bathing;Dressing;Carry;Reach Overhead;Sleep    Examination-Participation Restrictions Cleaning;Meal Prep;Community Activity;Yard Work     Stability/Clinical Decision Making Stable/Uncomplicated    Rehab Potential Fair    PT Frequency 2x / week  PT Duration 8 weeks    PT Treatment/Interventions Electrical Stimulation;Cryotherapy;ADLs/Self Care Home Management;Moist Heat;Ultrasound;Gait training;Therapeutic activities;Therapeutic exercise;Patient/family education;Manual techniques;Passive range of motion;Dry needling;Splinting;Taping;Joint Manipulations    PT Next Visit Plan manual therapy    Consulted and Agree with Plan of Care Patient;Family member/caregiver           Patient will benefit from skilled therapeutic intervention in order to improve the following deficits and impairments:  Decreased activity tolerance, Decreased endurance, Decreased range of motion, Decreased strength, Impaired flexibility, Impaired tone, Pain, Impaired UE functional use  Visit Diagnosis: Limited joint range of motion (ROM)  Muscle weakness (generalized)  Stiffness of left shoulder, not elsewhere classified     Problem List Patient Active Problem List   Diagnosis Date Noted  . Left rotator cuff tear 02/08/2018    Alanson Puls, [place] DPT 06/17/2020, 9:41 AM  [place] MAIN [facility] SERVICES [address] [place], [place], [address] Phone: [phone]   Fax:  [phone]  Name: Shannon Donovan MRN: [id number] Date of Birth: 1950/12/08

## 2020-06-22 ENCOUNTER — Ambulatory Visit: Payer: Medicare Other | Admitting: Physical Therapy

## 2020-06-22 ENCOUNTER — Encounter: Payer: Self-pay | Admitting: Physical Therapy

## 2020-06-22 ENCOUNTER — Other Ambulatory Visit: Payer: Self-pay

## 2020-06-22 DIAGNOSIS — M25612 Stiffness of left shoulder, not elsewhere classified: Secondary | ICD-10-CM

## 2020-06-22 DIAGNOSIS — M6281 Muscle weakness (generalized): Secondary | ICD-10-CM

## 2020-06-22 DIAGNOSIS — M2560 Stiffness of unspecified joint, not elsewhere classified: Secondary | ICD-10-CM

## 2020-06-22 NOTE — Therapy (Signed)
[place] MAIN [facility] SERVICES [address] [place], [place], [address] Phone: [phone]   Fax:  [phone]  Physical Therapy Treatment  Patient Details  Name: Shannon Donovan MRN: [id number] Date of Birth: 05-03-1951 Referring Provider (PT): shah, [place]   Encounter Date: 06/22/2020   PT End of Session - 06/22/20 0929    Visit Number 19    Number of Visits 34    Date for PT Re-Evaluation 08/06/20    Authorization Type MCR    Authorization Time Period 04/16/20-7/29-21    PT Start Time 0900    PT Stop Time 0940    PT Time Calculation (min) 40 min    Activity Tolerance Patient tolerated treatment well    Behavior During Therapy [facility] for tasks assessed/performed           Past Medical History:  Diagnosis Date  . Adolescent scoliosis   . Anxiety   . Atrial fibrillation ([place])   . Dysrhythmia    A fib  . History of chicken pox   . Hyperlipidemia   . Hyperplastic colon polyp 09/05/2016   Tubular adenoma of colon  . Hypertension    benign/ controlled  . Short-term memory loss   . Shoulder tendinitis    LEFT upper biceps tendonitis, rotator cuff tendinitis  . Stroke ([place]) 40/8144   embolic CVA/ short term memory loss    Past Surgical History:  Procedure Laterality Date  . BACK SURGERY     x2 at age 39 and 83 for scoliosis  . COLONOSCOPY N/A 09/05/2016   Procedure: COLONOSCOPY;  Surgeon: Lollie Sails, MD;  Location: [facility] ENDOSCOPY;  Service: Endoscopy;  Laterality: N/A;  Pt on Coumadin (PT/ INR)  . SHOULDER ARTHROSCOPY WITH OPEN ROTATOR CUFF REPAIR Left 02/08/2018   Procedure: SHOULDER ARTHROSCOPY WITH OPEN ROTATOR CUFF REPAIR;  Surgeon: Corky Mull, MD;  Location: [facility] ORS;  Service: Orthopedics;  Laterality: Left;  . SHOULDER CLOSED REDUCTION Left 05/09/2018   Procedure: CLOSED MANIPULATION SHOULDER  UNDER ANESTHESIA WITH STEROID INJECTION;  Surgeon: Corky Mull, MD;  Location: [place];  Service: Orthopedics;   Laterality: Left;  . TONSILLECTOMY      There were no vitals filed for this visit.   Subjective Assessment - 06/22/20 0928    Subjective Patient reports no  pain at start of session to l elbow.. she is wearing L elbow brace at night.    Currently in Pain? No/denies    Pain Score 0-No pain           Treatment: Left elbow ER x 15  left elbow flex/ext with 1 lb rod x 15  Shoulder bench press with 1 lb rod x 15 Shoulder protraction x 15 in supine with 1 lb  Shoulder retraction in sitting x 15  Shoulder flex x 15 with 1 lb rod  [place] x 5 mins  UBE x 5 mins   Patient performed with instruction, verbal cues, tactile cues of therapist: goal:increase tissue extensibility, promote proper posture, improve mobility                           PT Education - 06/22/20 0928    Education Details HEP    Person(s) Educated Patient    Methods Explanation    Comprehension Verbalized understanding;Verbal cues required;Tactile cues required;Need further instruction            PT Short Term Goals - 05/20/20 8185  PT SHORT TERM GOAL #1   Title Patient will be independent in home exercise program to improve strength/mobility for better functional independence with ADLs.    Time 4    Period Weeks    Status Achieved    Target Date 05/14/20      PT SHORT TERM GOAL #2   Title Patient will be able to perform household work/ chores without increase in symptoms.    Time 4    Period Weeks    Status Partially Met   not exacerbated after activity, mayu be during depending on use   Target Date 05/14/20             PT Long Term Goals - 06/11/20 1635      PT LONG TERM GOAL #1   Title Patient will report a worst pain of 3/10 on VAS in left shoulder and elbow  to improve tolerance with ADLs and reduced symptoms with activities.    Baseline at reassessment 7/7: pain can still be higher than 3/10 (caregiver reports pain unchanged, or sometimes worse)    Time 8     Period Weeks    Status On-going    Target Date 08/06/20      PT LONG TERM GOAL #2   Title Patient will demonstrate adequate shoulder ROM and strength to be able to shave and dress independently with pain less than 3/10.    Baseline Achieved    Time 8    Period Weeks    Status Achieved      PT LONG TERM GOAL #3   Title Patient will decrease Quick DASH score by > 8 points demonstrating reduced self-reported upper extremity disability.    Time 8    Period Weeks    Status On-going    Target Date 08/06/20               Pt requires direction and verbal cues for correct performance of strengthening exercises. Patient demonstrates weakness in LUE and performs open and closed chain exercises with no reports of increased pain. Pt was able to perform all exercises with min assist and VC for technique Pt encouraged continuing HEP .Follow-up as scheduled.   Plan - 06/22/20 0929    Clinical Impression Statement p    Personal Factors and Comorbidities Age;Comorbidity 1    Examination-Activity Limitations Bathing;Dressing;Carry;Reach Overhead;Sleep    Examination-Participation Restrictions Cleaning;Meal Prep;Community Activity;Yard Work    Stability/Clinical Decision Making Stable/Uncomplicated    Rehab Potential Fair    PT Frequency 2x / week    PT Duration 8 weeks    PT Treatment/Interventions Electrical Stimulation;Cryotherapy;ADLs/Self Care Home Management;Moist Heat;Ultrasound;Gait training;Therapeutic activities;Therapeutic exercise;Patient/family education;Manual techniques;Passive range of motion;Dry needling;Splinting;Taping;Joint Manipulations    PT Next Visit Plan manual therapy    Consulted and Agree with Plan of Care Patient;Family member/caregiver           Patient will benefit from skilled therapeutic intervention in order to improve the following deficits and impairments:  Decreased activity tolerance, Decreased endurance, Decreased range of motion, Decreased strength,  Impaired flexibility, Impaired tone, Pain, Impaired UE functional use  Visit Diagnosis: Limited joint range of motion (ROM)  Muscle weakness (generalized)  Stiffness of left shoulder, not elsewhere classified     Problem List Patient Active Problem List   Diagnosis Date Noted  . Left rotator cuff tear 02/08/2018    Alanson Puls, PT DPT 06/22/2020, 9:39 AM  [place] MAIN [facility] SERVICES [place],  [place], [address] Phone: [phone]   Fax:  [phone]  Name: Codee Bloodworth MRN: [id number] Date of Birth: 1951-03-28

## 2020-06-24 ENCOUNTER — Ambulatory Visit: Payer: Medicare Other | Admitting: Physical Therapy

## 2020-06-24 ENCOUNTER — Other Ambulatory Visit: Payer: Self-pay

## 2020-06-24 ENCOUNTER — Encounter: Payer: Self-pay | Admitting: Physical Therapy

## 2020-06-24 DIAGNOSIS — M2560 Stiffness of unspecified joint, not elsewhere classified: Secondary | ICD-10-CM | POA: Diagnosis not present

## 2020-06-24 DIAGNOSIS — M25612 Stiffness of left shoulder, not elsewhere classified: Secondary | ICD-10-CM

## 2020-06-24 DIAGNOSIS — M6281 Muscle weakness (generalized): Secondary | ICD-10-CM

## 2020-06-24 NOTE — Therapy (Signed)
[place] MAIN [facility] SERVICES [address] [place], [place], [address] Phone: [phone]   Fax:  [phone]  Physical Therapy Treatment  Patient Details  Name: Shannon Donovan MRN: [id number] Date of Birth: 27-Jun-1951 Referring Provider (PT): shah, [place]   Encounter Date: 06/24/2020   PT End of Session - 06/24/20 0932    Visit Number 20    Number of Visits 34    Date for PT Re-Evaluation 08/06/20    Authorization Type MCR    Authorization Time Period 04/16/20-7/29-21    PT Start Time 0900    PT Stop Time 0940    PT Time Calculation (min) 40 min    Activity Tolerance Patient tolerated treatment well    Behavior During Therapy [facility] for tasks assessed/performed           Past Medical History:  Diagnosis Date  . Adolescent scoliosis   . Anxiety   . Atrial fibrillation ([place])   . Dysrhythmia    A fib  . History of chicken pox   . Hyperlipidemia   . Hyperplastic colon polyp 09/05/2016   Tubular adenoma of colon  . Hypertension    benign/ controlled  . Short-term memory loss   . Shoulder tendinitis    LEFT upper biceps tendonitis, rotator cuff tendinitis  . Stroke ([place]) 85/2778   embolic CVA/ short term memory loss    Past Surgical History:  Procedure Laterality Date  . BACK SURGERY     x2 at age 71 and 49 for scoliosis  . COLONOSCOPY N/A 09/05/2016   Procedure: COLONOSCOPY;  Surgeon: Lollie Sails, MD;  Location: [facility] ENDOSCOPY;  Service: Endoscopy;  Laterality: N/A;  Pt on Coumadin (PT/ INR)  . SHOULDER ARTHROSCOPY WITH OPEN ROTATOR CUFF REPAIR Left 02/08/2018   Procedure: SHOULDER ARTHROSCOPY WITH OPEN ROTATOR CUFF REPAIR;  Surgeon: Corky Mull, MD;  Location: [facility] ORS;  Service: Orthopedics;  Laterality: Left;  . SHOULDER CLOSED REDUCTION Left 05/09/2018   Procedure: CLOSED MANIPULATION SHOULDER  UNDER ANESTHESIA WITH STEROID INJECTION;  Surgeon: Corky Mull, MD;  Location: [place];  Service:  Orthopedics;  Laterality: Left;  . TONSILLECTOMY      There were no vitals filed for this visit.   Subjective Assessment - 06/24/20 0932    Subjective Patient reports no  pain at start of session to l elbow.. she is wearing L elbow brace at night.    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 She is having diffiutly with dressing and bathing with some effort. She is having pain intermittent during activiites.She had rotator cuff surgery L shoulder 2 years ago. She had a second surgery 2 months after the first surgery. She had PT following the left shoulder surgery.02/08/18, then folowed up with manipulation 6/19.    Limitations House hold activities    How long can you sit comfortably? unlimited    How long can you stand comfortably? not sure    How long can you walk comfortably? not sure    Patient Stated Goals to be able to use her left UE for functional activities    Currently in Pain? No/denies    Pain Score 0-No pain    Pain Onset 1 to 4 weeks ago            Treatment: UBE x 5 mins [place] 1.5 cmsquared,x 8  mins to left elbow and arm musculature Left elbow ER x 15  left elbow flex/ext with 1 lb rod x 15  Shoulder bench press with 1 lb rod x 15 Shoulder protraction x 15 in supine with 1 lb  Shoulder retraction in sitting x 15  Shoulder flex x 15 with 1 lb rod Shoulder ranger horizontal abd/add and flex x 10 x 2     Patient performed with instruction, verbal cues, tactile cues of therapist: goal:increase tissue extensibility, promote proper posture, improve mobility                           PT Education - 06/24/20 0932    Education Details HEP    Person(s) Educated Patient    Methods Explanation    Comprehension Verbalized understanding;Returned demonstration;Tactile cues required;Need further instruction            PT Short Term Goals - 05/20/20 0925        PT SHORT TERM GOAL #1   Title Patient will be independent in home exercise program to improve strength/mobility for better functional independence with ADLs.    Time 4    Period Weeks    Status Achieved    Target Date 05/14/20      PT SHORT TERM GOAL #2   Title Patient will be able to perform household work/ chores without increase in symptoms.    Time 4    Period Weeks    Status Partially Met   not exacerbated after activity, mayu be during depending on use   Target Date 05/14/20             PT Long Term Goals - 06/24/20 0934      PT LONG TERM GOAL #1   Title Patient will report a worst pain of 3/10 on VAS in left shoulder and elbow  to improve tolerance with ADLs and reduced symptoms with activities.    Baseline at reassessment 7/7: pain can still be higher than 3/10 (caregiver reports pain unchanged, or sometimes worse)06/24/20 Patient is having 0-1 pain in left elbow    Time 8    Period Weeks    Status Achieved    Target Date 06/24/20      PT LONG TERM GOAL #2   Title Patient will demonstrate adequate shoulder ROM and strength to be able to shave and dress independently with pain less than 3/10.    Baseline Achieved    Time 8    Period Weeks    Status Achieved      PT LONG TERM GOAL #3   Title Patient will decrease Quick DASH score by > 8 points demonstrating reduced self-reported upper extremity disability.06/24/20=29.5    Time 8    Period Weeks    Status Partially Met    Target Date 08/06/20                 Plan - 06/24/20 0933    Clinical Impression Statement Patient's condition has the potential to improve in response to therapy. Maximum improvement is yet to be obtained. The anticipated improvement is attainable and reasonable in a generally predictable time.  Patient reports that her functional mobility depends on how active she is during the day. Her outcome measures improved and  goals were reviewed and progressed. Patient will continue to benefit  from skilled PT to improve mobility.She is having 0-1 pain to left elbow and is able to perform AROM and  light weighted exercises.     Personal Factors and Comorbidities Age;Comorbidity 1    Examination-Activity Limitations Bathing;Dressing;Carry;Reach Overhead;Sleep    Examination-Participation Restrictions Cleaning;Meal Prep;Community Activity;Yard Work    Stability/Clinical Decision Making Stable/Uncomplicated    Rehab Potential Fair    PT Frequency 2x / week    PT Duration 8 weeks    PT Treatment/Interventions Electrical Stimulation;Cryotherapy;ADLs/Self Care Home Management;Moist Heat;Ultrasound;Gait training;Therapeutic activities;Therapeutic exercise;Patient/family education;Manual techniques;Passive range of motion;Dry needling;Splinting;Taping;Joint Manipulations    PT Next Visit Plan manual therapy    Consulted and Agree with Plan of Care Patient;Family member/caregiver           Patient will benefit from skilled therapeutic intervention in order to improve the following deficits and impairments:  Decreased activity tolerance, Decreased endurance, Decreased range of motion, Decreased strength, Impaired flexibility, Impaired tone, Pain, Impaired UE functional use  Visit Diagnosis: Limited joint range of motion (ROM)  Muscle weakness (generalized)  Stiffness of left shoulder, not elsewhere classified     Problem List Patient Active Problem List   Diagnosis Date Noted  . Left rotator cuff tear 02/08/2018    Alanson Puls, [place] DPT 06/24/2020, 9:42 AM  [place] MAIN [facility] SERVICES [address] [place], [place], [address] Phone: [phone]   Fax:  [phone]  Name: Shannon Donovan MRN: [id number] Date of Birth: 1951-04-04

## 2020-06-29 ENCOUNTER — Encounter: Payer: Self-pay | Admitting: Physical Therapy

## 2020-06-29 ENCOUNTER — Ambulatory Visit: Payer: Medicare Other | Admitting: Physical Therapy

## 2020-06-29 ENCOUNTER — Other Ambulatory Visit: Payer: Self-pay

## 2020-06-29 DIAGNOSIS — M25612 Stiffness of left shoulder, not elsewhere classified: Secondary | ICD-10-CM

## 2020-06-29 DIAGNOSIS — M6281 Muscle weakness (generalized): Secondary | ICD-10-CM

## 2020-06-29 DIAGNOSIS — M2560 Stiffness of unspecified joint, not elsewhere classified: Secondary | ICD-10-CM | POA: Diagnosis not present

## 2020-06-29 NOTE — Therapy (Signed)
[place] MAIN [facility] SERVICES [address] [place], [place], [address] Phone: [phone]   Fax:  [phone]  Physical Therapy Treatment  Patient Details  Name: Shannon Donovan MRN: [id number] Date of Birth: Jun 29, 1951 Referring Provider (PT): shah, [place]   Encounter Date: 06/29/2020   PT End of Session - 06/29/20 0933    Visit Number 21    Number of Visits 34    Date for PT Re-Evaluation 08/06/20    Authorization Type MCR    Authorization Time Period 04/16/20-7/29-21    PT Start Time 0900    PT Stop Time 0945    PT Time Calculation (min) 45 min    Activity Tolerance Patient tolerated treatment well    Behavior During Therapy [facility] for tasks assessed/performed           Past Medical History:  Diagnosis Date  . Adolescent scoliosis   . Anxiety   . Atrial fibrillation ([place])   . Dysrhythmia    A fib  . History of chicken pox   . Hyperlipidemia   . Hyperplastic colon polyp 09/05/2016   Tubular adenoma of colon  . Hypertension    benign/ controlled  . Short-term memory loss   . Shoulder tendinitis    LEFT upper biceps tendonitis, rotator cuff tendinitis  . 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 BACK SURGERY     x2 at age 8 and 79 for scoliosis  . COLONOSCOPY N/A 09/05/2016   Procedure: COLONOSCOPY;  Surgeon: Lollie Sails, MD;  Location: [facility] ENDOSCOPY;  Service: Endoscopy;  Laterality: N/A;  Pt on Coumadin (PT/ INR)  . SHOULDER ARTHROSCOPY WITH OPEN ROTATOR CUFF REPAIR Left 02/08/2018   Procedure: SHOULDER ARTHROSCOPY WITH OPEN ROTATOR CUFF REPAIR;  Surgeon: Corky Mull, MD;  Location: [facility] ORS;  Service: Orthopedics;  Laterality: Left;  . SHOULDER CLOSED REDUCTION Left 05/09/2018   Procedure: CLOSED MANIPULATION SHOULDER  UNDER ANESTHESIA WITH STEROID INJECTION;  Surgeon: Corky Mull, MD;  Location: [place];  Service:  Orthopedics;  Laterality: Left;  . TONSILLECTOMY      There were no vitals filed for this visit.   Subjective Assessment - 06/29/20 0927    Subjective Patient reports  pain at start of session to left neck and L shoulder.. She is wearing L elbow brace at night.    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 She is having diffiutly with dressing and bathing with some effort. She is having pain intermittent during activiites.She had rotator cuff surgery L shoulder 2 years ago. She had a second surgery 2 months after the first surgery. She had PT following the left shoulder surgery.02/08/18, then folowed up with manipulation 6/19.    Limitations House hold activities    How long can you sit comfortably? unlimited    How long can you stand comfortably? not sure    How long can you walk comfortably? not sure    Patient Stated Goals to be able to use her left UE for functional activities    Currently in Pain? Yes    Pain Score 5     Pain Location Neck    Pain Orientation Left    Pain Descriptors / Indicators Aching    Pain Type Chronic pain  Pain Onset 1 to 4 weeks ago           Treatment: Scapula mobilization elevation/depression, lateral / medial x 10 grade 2  Soft tissue mobility to left scapula musculature  [place] 1.5 cmsquared,x 10 mins to left elbow and arm musculature E-stim9.8volts, 80/150 hz, carrier freq 4000 Hz x 20 mins MH to L shoulder/elbowduring exercises: PROM to left shoulderflex / abd/ ER/ IR, L elbow ext and supination and pronationx 10 Patient performed with instruction, verbal cues, tactile cues of therapist: goal: increase tissue extensibility, promote proper posture, improve mobility                         PT Education - 06/29/20 0933    Education Details HEP    Person(s) Educated Patient    Methods Explanation    Comprehension Returned  demonstration;Verbalized understanding            PT Short Term Goals - 05/20/20 0925      PT SHORT TERM GOAL #1   Title Patient will be independent in home exercise program to improve strength/mobility for better functional independence with ADLs.    Time 4    Period Weeks    Status Achieved    Target Date 05/14/20      PT SHORT TERM GOAL #2   Title Patient will be able to perform household work/ chores without increase in symptoms.    Time 4    Period Weeks    Status Partially Met   not exacerbated after activity, mayu be during depending on use   Target Date 05/14/20             PT Long Term Goals - 06/24/20 0934      PT LONG TERM GOAL #1   Title Patient will report a worst pain of 3/10 on VAS in left shoulder and elbow  to improve tolerance with ADLs and reduced symptoms with activities.    Baseline at reassessment 7/7: pain can still be higher than 3/10 (caregiver reports pain unchanged, or sometimes worse)06/24/20 Patient is having 0-1 pain in left elbow    Time 8    Period Weeks    Status Achieved    Target Date 06/24/20      PT LONG TERM GOAL #2   Title Patient will demonstrate adequate shoulder ROM and strength to be able to shave and dress independently with pain less than 3/10.    Baseline Achieved    Time 8    Period Weeks    Status Achieved      PT LONG TERM GOAL #3   Title Patient will decrease Quick DASH score by > 8 points demonstrating reduced self-reported upper extremity disability.06/24/20=29.5    Time 8    Period Weeks    Status Partially Met    Target Date 08/06/20                 Plan - 06/29/20 0933    Clinical Impression Statement Pt has continued to show improvements in LE strength, mobility and decrease in pain in left elbow, but today has increased pain in neck and L shoulder that began last night.   Pt was able to tolerate PROM and MH , e-stim and [place] to decrease pain .  Pt would continue to benefit from skilled services in order  to further strengthen Left UE, as well as further decrease pain in LUE.Marland Kitchen   Personal Factors and Comorbidities Age;Comorbidity  1    Examination-Activity Limitations Bathing;Dressing;Carry;Reach Overhead;Sleep    Examination-Participation Restrictions Cleaning;Meal Prep;Community Activity;Yard Work    Stability/Clinical Decision Making Stable/Uncomplicated    Rehab Potential Fair    PT Frequency 2x / week    PT Duration 8 weeks    PT Treatment/Interventions Electrical Stimulation;Cryotherapy;ADLs/Self Care Home Management;Moist Heat;Ultrasound;Gait training;Therapeutic activities;Therapeutic exercise;Patient/family education;Manual techniques;Passive range of motion;Dry needling;Splinting;Taping;Joint Manipulations    PT Next Visit Plan manual therapy    Consulted and Agree with Plan of Care Patient;Family member/caregiver           Patient will benefit from skilled therapeutic intervention in order to improve the following deficits and impairments:  Decreased activity tolerance, Decreased endurance, Decreased range of motion, Decreased strength, Impaired flexibility, Impaired tone, Pain, Impaired UE functional use  Visit Diagnosis: Limited joint range of motion (ROM)  Muscle weakness (generalized)  Stiffness of left shoulder, not elsewhere classified     Problem List Patient Active Problem List   Diagnosis Date Noted  . Left rotator cuff tear 02/08/2018    Alanson Puls, [place] DPT 06/29/2020, 9:34 AM  [place] MAIN [facility] SERVICES [address] [place], [place], [address] Phone: [phone]   Fax:  [phone]  Name: Shannon Donovan MRN: [id number] Date of Birth: [birth year]

## 2020-07-01 ENCOUNTER — Other Ambulatory Visit: Payer: Self-pay

## 2020-07-01 ENCOUNTER — Ambulatory Visit: Payer: Medicare Other | Admitting: Physical Therapy

## 2020-07-01 ENCOUNTER — Encounter: Payer: Self-pay | Admitting: Physical Therapy

## 2020-07-01 DIAGNOSIS — M2560 Stiffness of unspecified joint, not elsewhere classified: Secondary | ICD-10-CM | POA: Diagnosis not present

## 2020-07-01 DIAGNOSIS — M25612 Stiffness of left shoulder, not elsewhere classified: Secondary | ICD-10-CM

## 2020-07-01 DIAGNOSIS — M6281 Muscle weakness (generalized): Secondary | ICD-10-CM

## 2020-07-01 NOTE — Therapy (Signed)
[place] MAIN [facility] SERVICES [address] [place], [place], [address] Phone: [phone]   Fax:  [phone]  Physical Therapy Treatment  Patient Details  Name: Shannon Donovan MRN: [id number] Date of Birth: September 10, 1951 Referring Provider (PT): shah, [place]   Encounter Date: 07/01/2020   PT End of Session - 07/01/20 0912    Visit Number 22    Number of Visits 34    Date for PT Re-Evaluation 08/06/20    Authorization Type MCR    Authorization Time Period 04/16/20-7/29-21    PT Start Time 0905    PT Stop Time 0945    PT Time Calculation (min) 40 min    Activity Tolerance Patient tolerated treatment well    Behavior During Therapy [facility] for tasks assessed/performed           Past Medical History:  Diagnosis Date  . Adolescent scoliosis   . Anxiety   . Atrial fibrillation ([place])   . Dysrhythmia    A fib  . History of chicken pox   . Hyperlipidemia   . Hyperplastic colon polyp 09/05/2016   Tubular adenoma of colon  . Hypertension    benign/ controlled  . Short-term memory loss   . Shoulder tendinitis    LEFT upper biceps tendonitis, rotator cuff tendinitis  . Stroke ([place]) 51/8841   embolic CVA/ short term memory loss    Past Surgical History:  Procedure Laterality Date  . BACK SURGERY     x2 at age 67 and 58 for scoliosis  . COLONOSCOPY N/A 09/05/2016   Procedure: COLONOSCOPY;  Surgeon: Lollie Sails, MD;  Location: [facility] ENDOSCOPY;  Service: Endoscopy;  Laterality: N/A;  Pt on Coumadin (PT/ INR)  . SHOULDER ARTHROSCOPY WITH OPEN ROTATOR CUFF REPAIR Left 02/08/2018   Procedure: SHOULDER ARTHROSCOPY WITH OPEN ROTATOR CUFF REPAIR;  Surgeon: Corky Mull, MD;  Location: [facility] ORS;  Service: Orthopedics;  Laterality: Left;  . SHOULDER CLOSED REDUCTION Left 05/09/2018   Procedure: CLOSED MANIPULATION SHOULDER  UNDER ANESTHESIA WITH STEROID INJECTION;  Surgeon: Corky Mull, MD;  Location: [place];  Service:  Orthopedics;  Laterality: Left;  . TONSILLECTOMY      There were no vitals filed for this visit.   Subjective Assessment - 07/01/20 0911    Subjective Patient reports no pain at start of session.. She is wearing L elbow brace at night.    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 She is having diffiutly with dressing and bathing with some effort. She is having pain intermittent during activiites.She had rotator cuff surgery L shoulder 2 years ago. She had a second surgery 2 months after the first surgery. She had PT following the left shoulder surgery.02/08/18, then folowed up with manipulation 6/19.    Limitations House hold activities    How long can you sit comfortably? unlimited    How long can you stand comfortably? not sure    How long can you walk comfortably? not sure    Patient Stated Goals to be able to use her left UE for functional activities    Currently in Pain? No/denies    Pain Score 0-No pain    Pain Onset 1 to 4 weeks ago           Treatment: Scapula mobilization elevation/depression, lateral / medial x 10 grade 2  Soft tissue  mobility to left scapula musculature sidelying L UE extension x 10 x 2  Supine L UE abd x 10 x 2 Supine flex with 1 lb x 10 x 2 Supine horizontal abd/add x 10 x 2  Supine press towards ceiling with 1 lb rod x 10 x 2  Elbow flex/ ext x 10 x 2  PROM to left shoulderflex / abd/ ER/ IR, L elbow ext and supination and pronationx 10  Patient performed with instruction, verbal cues, tactile cues of therapist: goal:increase tissue extensibility, promote proper posture, improve mobility                          PT Education - 07/01/20 0912    Education Details HEP    Person(s) Educated Patient    Methods Explanation    Comprehension Verbalized understanding            PT Short Term Goals - 05/20/20 0925      PT SHORT TERM  GOAL #1   Title Patient will be independent in home exercise program to improve strength/mobility for better functional independence with ADLs.    Time 4    Period Weeks    Status Achieved    Target Date 05/14/20      PT SHORT TERM GOAL #2   Title Patient will be able to perform household work/ chores without increase in symptoms.    Time 4    Period Weeks    Status Partially Met   not exacerbated after activity, mayu be during depending on use   Target Date 05/14/20             PT Long Term Goals - 06/24/20 0934      PT LONG TERM GOAL #1   Title Patient will report a worst pain of 3/10 on VAS in left shoulder and elbow  to improve tolerance with ADLs and reduced symptoms with activities.    Baseline at reassessment 7/7: pain can still be higher than 3/10 (caregiver reports pain unchanged, or sometimes worse)06/24/20 Patient is having 0-1 pain in left elbow    Time 8    Period Weeks    Status Achieved    Target Date 06/24/20      PT LONG TERM GOAL #2   Title Patient will demonstrate adequate shoulder ROM and strength to be able to shave and dress independently with pain less than 3/10.    Baseline Achieved    Time 8    Period Weeks    Status Achieved      PT LONG TERM GOAL #3   Title Patient will decrease Quick DASH score by > 8 points demonstrating reduced self-reported upper extremity disability.06/24/20=29.5    Time 8    Period Weeks    Status Partially Met    Target Date 08/06/20                 Plan - 07/01/20 0912    Clinical Impression Statement Pt was able to progress through exercises today with better movement and no pain.    Pt would continue to benefit from skilled therapy services in order to continue strengthening L UE to improve ADL's and better transfers with use of UE.    Personal Factors and Comorbidities Age;Comorbidity 1    Examination-Activity Limitations Bathing;Dressing;Carry;Reach Overhead;Sleep    Examination-Participation Restrictions  Cleaning;Meal Prep;Community Activity;Yard Work    Stability/Clinical Decision Making Stable/Uncomplicated    [place]  PT Frequency 2x / week    PT Duration 8 weeks    PT Treatment/Interventions Electrical Stimulation;Cryotherapy;ADLs/Self Care Home Management;Moist Heat;Ultrasound;Gait training;Therapeutic activities;Therapeutic exercise;Patient/family education;Manual techniques;Passive range of motion;Dry needling;Splinting;Taping;Joint Manipulations    PT Next Visit Plan manual therapy    Consulted and Agree with Plan of Care Patient;Family member/caregiver           Patient will benefit from skilled therapeutic intervention in order to improve the following deficits and impairments:  Decreased activity tolerance, Decreased endurance, Decreased range of motion, Decreased strength, Impaired flexibility, Impaired tone, Pain, Impaired UE functional use  Visit Diagnosis: Limited joint range of motion (ROM)  Muscle weakness (generalized)  Stiffness of left shoulder, not elsewhere classified     Problem List Patient Active Problem List   Diagnosis Date Noted  . Left rotator cuff tear 02/08/2018    Alanson Puls , [place] DPT 07/01/2020, 9:14 AM  [place] MAIN [facility] SERVICES [address] [place], [place], [address] Phone: [phone]   Fax:  [phone]  Name: Shannon Donovan MRN: [id number] Date of Birth: [birth year]

## 2020-07-01 NOTE — Patient Instructions (Signed)
FLEXION: Side-Lying - Elbow Flexed (Active)    Lie on right side, top arm bent. Raise arm up above head, keeping elbow bent. Perform _2__ sets of __10_ repetitions. Perform __2_ sessions per day.  Copyright  VHI. All rights reserved.  ABDUCTION: Side-Lying (Active)    Lie on right side. Lift top arm as high as possible, keeping elbow straight.  Complete ___2 sets of __10_ repetitions. Perform _2__ sessions per day.  Copyright  VHI. All rights reserved.  Chest / Shoulder Stretch: Yardstick Side to Side    Sitting: Hold stick against front of thighs with arms straight. Keeping arms straight, swing stick up to one side to a count of 4. Hold 1 count. Return to a count of 4. Repeat _10___ times to each side.  Copyright  VHI. All rights reserved.  Shoulder Press: Supine with Protraction    Hold arms towards ceiling and  push fists further toward ceiling from shoulder blades. Repeat _10_ times per set. Do 2__ sets per session.   http://tub.exer.us/292   Copyright  VHI. All rights reserved.  Shoulder: Flexion (Supine)    With hands shoulder width apart, slowly lower dowel from hips to as far towards your ears as you can comfortable move. . Do not let elbows bend. Keep back flat. Hold __2__ seconds. Repeat ___10_ times. Do _2___ sessions per day. CAUTION: Stretch slowly and gently.  Copyright  VHI. All rights reserved.  SHOULDER: External Rotation - Supine (Cane)    Hold cane with both hands. Rotate arm away from body. Keep elbow  next to body. __10_ reps per set, ___2 sets per day,    Copyright  VHI. All rights reserved.  SHOULDER: Abduction - Supine    Raise arms out and up at same speed with palms up. Keep elbows straight. Do not shrug shoulders. _10__ reps per set, __2_ sets per day   Copyright  VHI. All rights reserved.

## 2020-07-06 ENCOUNTER — Ambulatory Visit: Payer: Medicare Other | Admitting: Physical Therapy

## 2020-07-06 ENCOUNTER — Encounter: Payer: Self-pay | Admitting: Physical Therapy

## 2020-07-06 ENCOUNTER — Other Ambulatory Visit: Payer: Self-pay

## 2020-07-06 DIAGNOSIS — M2560 Stiffness of unspecified joint, not elsewhere classified: Secondary | ICD-10-CM

## 2020-07-06 DIAGNOSIS — M6281 Muscle weakness (generalized): Secondary | ICD-10-CM

## 2020-07-06 DIAGNOSIS — M25612 Stiffness of left shoulder, not elsewhere classified: Secondary | ICD-10-CM

## 2020-07-06 NOTE — Therapy (Signed)
[place] MAIN [facility] SERVICES [address] [place], [place], [address] Phone: [phone]   Fax:  [phone]  Physical Therapy Treatment  Patient Details  Name: Shannon Donovan MRN: [id number] Date of Birth: 25-Jun-1951 Referring Provider (PT): shah, [place]   Encounter Date: 07/06/2020   PT End of Session - 07/06/20 0929    Visit Number 23    Number of Visits 34    Date for PT Re-Evaluation 08/06/20    Authorization Type MCR    Authorization Time Period 04/16/20-7/29-21    PT Start Time 0900    PT Stop Time 0940    PT Time Calculation (min) 40 min    Activity Tolerance Patient tolerated treatment well    Behavior During Therapy [facility] for tasks assessed/performed           Past Medical History:  Diagnosis Date  . Adolescent scoliosis   . Anxiety   . Atrial fibrillation ([place])   . Dysrhythmia    A fib  . History of chicken pox   . Hyperlipidemia   . Hyperplastic colon polyp 09/05/2016   Tubular adenoma of colon  . Hypertension    benign/ controlled  . Short-term memory loss   . Shoulder tendinitis    LEFT upper biceps tendonitis, rotator cuff tendinitis  . Stroke ([place]) 77/8242   embolic CVA/ short term memory loss    Past Surgical History:  Procedure Laterality Date  . BACK SURGERY     x2 at age 42 and 38 for scoliosis  . COLONOSCOPY N/A 09/05/2016   Procedure: COLONOSCOPY;  Surgeon: Lollie Sails, MD;  Location: [facility] ENDOSCOPY;  Service: Endoscopy;  Laterality: N/A;  Pt on Coumadin (PT/ INR)  . SHOULDER ARTHROSCOPY WITH OPEN ROTATOR CUFF REPAIR Left 02/08/2018   Procedure: SHOULDER ARTHROSCOPY WITH OPEN ROTATOR CUFF REPAIR;  Surgeon: Corky Mull, MD;  Location: [facility] ORS;  Service: Orthopedics;  Laterality: Left;  . SHOULDER CLOSED REDUCTION Left 05/09/2018   Procedure: CLOSED MANIPULATION SHOULDER  UNDER ANESTHESIA WITH STEROID INJECTION;  Surgeon: Corky Mull, MD;  Location: [place];  Service:  Orthopedics;  Laterality: Left;  . TONSILLECTOMY      There were no vitals filed for this visit.   Subjective Assessment - 07/06/20 0928    Subjective Patient reports no pain at start of session.. She is wearing L elbow brace at night.    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 She is having diffiutly with dressing and bathing with some effort. She is having pain intermittent during activiites.She had rotator cuff surgery L shoulder 2 years ago. She had a second surgery 2 months after the first surgery. She had PT following the left shoulder surgery.02/08/18, then folowed up with manipulation 6/19.    Limitations House hold activities    How long can you sit comfortably? unlimited    How long can you stand comfortably? not sure    How long can you walk comfortably? not sure    Patient Stated Goals to be able to use her left UE for functional activities    Currently in Pain? No/denies    Pain Score 0-No pain    Pain Onset 1 to 4 weeks ago           Treatment: Scapula mobilization elevation/depression, lateral / medial x 10 grade 2 Soft tissue mobility  to left scapula musculature sidelying L UE extension x 10 x 2  Supine L UE abd x 10 x 2 Supine flex with 1 lb x 10 x 2 Supine horizontal abd/add x 10 x 2  Supine press towards ceiling with 1 lb rod x 10 x 2  Elbow flex/ ext x 10 x 2  PROM to left shoulderflex / abd/ ER/ IR, L elbow ext and supination and pronationx 10  Patient performed with instruction, verbal cues, tactile cues of therapist: goal:increase tissue extensibility, promote proper posture, improve mobility                           PT Education - 07/06/20 0929    Education Details HEP    Person(s) Educated Patient    Methods Explanation    Comprehension Verbalized understanding;Returned demonstration;Verbal cues required;Tactile cues required;Need  further instruction            PT Short Term Goals - 05/20/20 0925      PT SHORT TERM GOAL #1   Title Patient will be independent in home exercise program to improve strength/mobility for better functional independence with ADLs.    Time 4    Period Weeks    Status Achieved    Target Date 05/14/20      PT SHORT TERM GOAL #2   Title Patient will be able to perform household work/ chores without increase in symptoms.    Time 4    Period Weeks    Status Partially Met   not exacerbated after activity, mayu be during depending on use   Target Date 05/14/20             PT Long Term Goals - 06/24/20 0934      PT LONG TERM GOAL #1   Title Patient will report a worst pain of 3/10 on VAS in left shoulder and elbow  to improve tolerance with ADLs and reduced symptoms with activities.    Baseline at reassessment 7/7: pain can still be higher than 3/10 (caregiver reports pain unchanged, or sometimes worse)06/24/20 Patient is having 0-1 pain in left elbow    Time 8    Period Weeks    Status Achieved    Target Date 06/24/20      PT LONG TERM GOAL #2   Title Patient will demonstrate adequate shoulder ROM and strength to be able to shave and dress independently with pain less than 3/10.    Baseline Achieved    Time 8    Period Weeks    Status Achieved      PT LONG TERM GOAL #3   Title Patient will decrease Quick DASH score by > 8 points demonstrating reduced self-reported upper extremity disability.06/24/20=29.5    Time 8    Period Weeks    Status Partially Met    Target Date 08/06/20                 Plan - 07/06/20 0930    Clinical Impression Statement  Pt was able to perform all exercises today with CGA.Marland Kitchen Pt was able to perform all strength exercises, demonstrating improvements in strength and stability.  Pt requires verbal, visual and tactile cues during exercise in order to complete tasks with proper form and technique, as well as to stay on task.  Pt would continue to  benefit from skilled PT services in order to further strengthen UE's,and improve coordination in order to increase functional mobility  and decrease risk of falls.   Personal Factors and Comorbidities Age;Comorbidity 1    Examination-Activity Limitations Bathing;Dressing;Carry;Reach Overhead;Sleep    Examination-Participation Restrictions Cleaning;Meal Prep;Community Activity;Yard Work    Stability/Clinical Decision Making Stable/Uncomplicated    Rehab Potential Fair    PT Frequency 2x / week    PT Duration 8 weeks    PT Treatment/Interventions Electrical Stimulation;Cryotherapy;ADLs/Self Care Home Management;Moist Heat;Ultrasound;Gait training;Therapeutic activities;Therapeutic exercise;Patient/family education;Manual techniques;Passive range of motion;Dry needling;Splinting;Taping;Joint Manipulations    PT Next Visit Plan manual therapy    Consulted and Agree with Plan of Care Patient;Family member/caregiver           Patient will benefit from skilled therapeutic intervention in order to improve the following deficits and impairments:  Decreased activity tolerance, Decreased endurance, Decreased range of motion, Decreased strength, Impaired flexibility, Impaired tone, Pain, Impaired UE functional use  Visit Diagnosis: Limited joint range of motion (ROM)  Muscle weakness (generalized)  Stiffness of left shoulder, not elsewhere classified     Problem List Patient Active Problem List   Diagnosis Date Noted  . Left rotator cuff tear 02/08/2018    Alanson Puls, [place] DPT 07/06/2020, 9:33 AM  [place] MAIN [facility] SERVICES [address] [place], [place], [address] Phone: [phone]   Fax:  [phone]  Name: Shannon Donovan MRN: [id number] Date of Birth: [birth year]

## 2020-07-08 ENCOUNTER — Ambulatory Visit: Payer: Medicare Other | Admitting: Physical Therapy

## 2020-07-08 ENCOUNTER — Encounter: Payer: Self-pay | Admitting: Physical Therapy

## 2020-07-08 ENCOUNTER — Other Ambulatory Visit: Payer: Self-pay

## 2020-07-08 DIAGNOSIS — M2560 Stiffness of unspecified joint, not elsewhere classified: Secondary | ICD-10-CM

## 2020-07-08 DIAGNOSIS — M6281 Muscle weakness (generalized): Secondary | ICD-10-CM

## 2020-07-08 NOTE — Therapy (Signed)
[place] MAIN [facility] SERVICES [address] [place], [place], [address] Phone: [phone]   Fax:  [phone]  Physical Therapy Treatment  Patient Details  Name: Shannon Donovan MRN: [id number] Date of Birth: [birth year] Referring Provider (PT): shah, [place]   Encounter Date: 07/08/2020   PT End of Session - 07/08/20 0913    Visit Number 24    Number of Visits 34    Date for PT Re-Evaluation 08/06/20    Authorization Type MCR    Authorization Time Period 04/16/20-7/29-21    PT Start Time 0905    PT Stop Time 0945    PT Time Calculation (min) 40 min    Activity Tolerance Patient tolerated treatment well    Behavior During Therapy [facility] for tasks assessed/performed           Past Medical History:  Diagnosis Date  . Adolescent scoliosis   . Anxiety   . Atrial fibrillation ([place])   . Dysrhythmia    A fib  . History of chicken pox   . Hyperlipidemia   . Hyperplastic colon polyp 09/05/2016   Tubular adenoma of colon  . Hypertension    benign/ controlled  . Short-term memory loss   . Shoulder tendinitis    LEFT upper biceps tendonitis, rotator cuff tendinitis  . 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 BACK SURGERY     x2 at age 8 and 43 for scoliosis  . COLONOSCOPY N/A 09/05/2016   Procedure: COLONOSCOPY;  Surgeon: Lollie Sails, MD;  Location: [facility] ENDOSCOPY;  Service: Endoscopy;  Laterality: N/A;  Pt on Coumadin (PT/ INR)  . SHOULDER ARTHROSCOPY WITH OPEN ROTATOR CUFF REPAIR Left 02/08/2018   Procedure: SHOULDER ARTHROSCOPY WITH OPEN ROTATOR CUFF REPAIR;  Surgeon: Corky Mull, MD;  Location: [facility] ORS;  Service: Orthopedics;  Laterality: Left;  . SHOULDER CLOSED REDUCTION Left 05/09/2018   Procedure: CLOSED MANIPULATION SHOULDER  UNDER ANESTHESIA WITH STEROID INJECTION;  Surgeon: Corky Mull, MD;  Location: [place];  Service:  Orthopedics;  Laterality: Left;  . TONSILLECTOMY      There were no vitals filed for this visit.   Subjective Assessment - 07/08/20 0913    Subjective Patient reports no pain at start of session.. She is wearing L elbow brace at night.    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 She is having diffiutly with dressing and bathing with some effort. She is having pain intermittent during activiites.She had rotator cuff surgery L shoulder 2 years ago. She had a second surgery 2 months after the first surgery. She had PT following the left shoulder surgery.02/08/18, then folowed up with manipulation 6/19.    Limitations House hold activities    How long can you sit comfortably? unlimited    How long can you stand comfortably? not sure    How long can you walk comfortably? not sure    Patient Stated Goals to be able to use her left UE for functional activities    Currently in Pain? No/denies    Pain Score 0-No pain    Pain Onset 1 to 4 weeks ago           Treatment: Scapula mobilization elevation/depression, lateral / medial x 10 grade 2 Soft tissue mobility  to left scapula musculature sidelying L UE extension x 10 x 2  Supine L UE abd x 10 x 2 Supine flex with 1 lb x 10 x 2 Supine horizontal abd/add x 10 x 2  Supine press towards ceiling with 1 lb rod x 10 x 2  Elbow flex/ ext x 10 x 2  PROM to left shoulderflex / abd/ ER/ IR, L elbow ext and supination and pronationx 10  Patient performed with instruction, verbal cues, tactile cues of therapist: goal:increase tissue extensibility, promote proper posture, improve mobility                          PT Education - 07/08/20 0913    Education Details HEP    Person(s) Educated Patient    Methods Explanation    Comprehension Verbalized understanding            PT Short Term Goals - 05/20/20 0925      PT SHORT TERM  GOAL #1   Title Patient will be independent in home exercise program to improve strength/mobility for better functional independence with ADLs.    Time 4    Period Weeks    Status Achieved    Target Date 05/14/20      PT SHORT TERM GOAL #2   Title Patient will be able to perform household work/ chores without increase in symptoms.    Time 4    Period Weeks    Status Partially Met   not exacerbated after activity, mayu be during depending on use   Target Date 05/14/20             PT Long Term Goals - 06/24/20 0934      PT LONG TERM GOAL #1   Title Patient will report a worst pain of 3/10 on VAS in left shoulder and elbow  to improve tolerance with ADLs and reduced symptoms with activities.    Baseline at reassessment 7/7: pain can still be higher than 3/10 (caregiver reports pain unchanged, or sometimes worse)06/24/20 Patient is having 0-1 pain in left elbow    Time 8    Period Weeks    Status Achieved    Target Date 06/24/20      PT LONG TERM GOAL #2   Title Patient will demonstrate adequate shoulder ROM and strength to be able to shave and dress independently with pain less than 3/10.    Baseline Achieved    Time 8    Period Weeks    Status Achieved      PT LONG TERM GOAL #3   Title Patient will decrease Quick DASH score by > 8 points demonstrating reduced self-reported upper extremity disability.06/24/20=29.5    Time 8    Period Weeks    Status Partially Met    Target Date 08/06/20                 Plan - 07/08/20 0914    Clinical Impression Statement  Pt was able to perform all exercises today with CGA.Marland Kitchen Pt was able to perform all balance and strength exercises, demonstrating improvements in LE strength and stability.   Pt requires verbal, visual and tactile cues during exercise in order to complete tasks with proper form and technique..  Pt would continue to benefit from skilled PT services in order to further strengthen LUE, and improve coordination in order to  increase functional mobility and decrease risk of falls    Personal Factors  and Comorbidities Age;Comorbidity 1    Examination-Activity Limitations Bathing;Dressing;Carry;Reach Overhead;Sleep    Examination-Participation Restrictions Cleaning;Meal Prep;Community Activity;Yard Work    Stability/Clinical Decision Making Stable/Uncomplicated    Rehab Potential Fair    PT Frequency 2x / week    PT Duration 8 weeks    PT Treatment/Interventions Electrical Stimulation;Cryotherapy;ADLs/Self Care Home Management;Moist Heat;Ultrasound;Gait training;Therapeutic activities;Therapeutic exercise;Patient/family education;Manual techniques;Passive range of motion;Dry needling;Splinting;Taping;Joint Manipulations    PT Next Visit Plan manual therapy    Consulted and Agree with Plan of Care Patient;Family member/caregiver           Patient will benefit from skilled therapeutic intervention in order to improve the following deficits and impairments:  Decreased activity tolerance, Decreased endurance, Decreased range of motion, Decreased strength, Impaired flexibility, Impaired tone, Pain, Impaired UE functional use  Visit Diagnosis: Limited joint range of motion (ROM)  Muscle weakness (generalized)     Problem List Patient Active Problem List   Diagnosis Date Noted  . Left rotator cuff tear 02/08/2018    Alanson Puls, [place] DPT 07/08/2020, 9:18 AM  [place] MAIN [facility] SERVICES [address] [place], [place], [address] Phone: [phone]   Fax:  [phone]  Name: Carriann Hesse MRN: [id number] Date of Birth: November 22, 1950

## 2020-07-13 ENCOUNTER — Other Ambulatory Visit: Payer: Self-pay

## 2020-07-13 ENCOUNTER — Ambulatory Visit: Payer: Medicare Other | Admitting: Physical Therapy

## 2020-07-13 ENCOUNTER — Encounter: Payer: Self-pay | Admitting: Physical Therapy

## 2020-07-13 DIAGNOSIS — M2560 Stiffness of unspecified joint, not elsewhere classified: Secondary | ICD-10-CM | POA: Diagnosis not present

## 2020-07-13 DIAGNOSIS — M6281 Muscle weakness (generalized): Secondary | ICD-10-CM

## 2020-07-13 DIAGNOSIS — M25612 Stiffness of left shoulder, not elsewhere classified: Secondary | ICD-10-CM

## 2020-07-13 NOTE — Therapy (Signed)
[place] MAIN [facility] SERVICES [address] [place], [place], [address] Phone: [phone]   Fax:  [phone]  Physical Therapy Treatment / discharge summary  Patient Details  Name: Shannon Donovan MRN: [id number] Date of Birth: February 22, 1951 Referring Provider (PT): shah, [place]   Encounter Date: 07/13/2020   PT End of Session - 07/13/20 1012    Visit Number 25    Number of Visits 34    Date for PT Re-Evaluation 08/06/20    Authorization Type MCR    Authorization Time Period 04/16/20-7/29-21    PT Start Time 0900    PT Stop Time 0945    PT Time Calculation (min) 45 min    Equipment Utilized During Treatment Gait belt    Activity Tolerance Patient tolerated treatment well    Behavior During Therapy WFL for tasks assessed/performed           Past Medical History:  Diagnosis Date  . Adolescent scoliosis   . Anxiety   . Atrial fibrillation ([place])   . Dysrhythmia    A fib  . History of chicken pox   . Hyperlipidemia   . Hyperplastic colon polyp 09/05/2016   Tubular adenoma of colon  . Hypertension    benign/ controlled  . Short-term memory loss   . Shoulder tendinitis    LEFT upper biceps tendonitis, rotator cuff tendinitis  . Stroke ([place]) 93/2355   embolic CVA/ short term memory loss    Past Surgical History:  Procedure Laterality Date  . BACK SURGERY     x2 at age 23 and 47 for scoliosis  . COLONOSCOPY N/A 09/05/2016   Procedure: COLONOSCOPY;  Surgeon: Lollie Sails, MD;  Location: [facility] ENDOSCOPY;  Service: Endoscopy;  Laterality: N/A;  Pt on Coumadin (PT/ INR)  . SHOULDER ARTHROSCOPY WITH OPEN ROTATOR CUFF REPAIR Left 02/08/2018   Procedure: SHOULDER ARTHROSCOPY WITH OPEN ROTATOR CUFF REPAIR;  Surgeon: Corky Mull, MD;  Location: [facility] ORS;  Service: Orthopedics;  Laterality: Left;  . SHOULDER CLOSED REDUCTION Left 05/09/2018   Procedure: CLOSED MANIPULATION SHOULDER  UNDER ANESTHESIA WITH STEROID INJECTION;  Surgeon:  Corky Mull, MD;  Location: [place];  Service: Orthopedics;  Laterality: Left;  . TONSILLECTOMY      There were no vitals filed for this visit.   Subjective Assessment - 07/13/20 1011    Subjective Patient reports no pain at start of session.. She is wearing L elbow brace at night.    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 She is having diffiutly with dressing and bathing with some effort. She is having pain intermittent during activiites.She had rotator cuff surgery L shoulder 2 years ago. She had a second surgery 2 months after the first surgery. She had PT following the left shoulder surgery.02/08/18, then folowed up with manipulation 6/19.    Limitations House hold activities    How long can you sit comfortably? unlimited    How long can you stand comfortably? not sure    How long can you walk comfortably? not sure    Patient Stated Goals to be able to use her left UE for functional activities    Currently in Pain? No/denies    Pain Score 0-No pain    Pain Onset 1 to 4 weeks ago            Treatment:  Scapula mobilization elevation/depression, lateral / medial x 10 grade 2 Soft tissue mobility to left scapula musculature sidelying L UE extension x 10 x 2  Supine L UE abd x 10 x 2 Supine flex with 1 lb x 10 x 2 Supine horizontal abd/add x 10 x 2  Supine press towards ceiling with 1 lb rod x 10 x 2  Elbow flex/ ext x 10 x 2  PROM to left shoulderflex / abd/ ER/ IR, L elbow ext and supination and pronationx 10  Patient performed with instruction, verbal cues, tactile cues of therapist: goal:increase tissue extensibility, promote proper posture, improve mobility                          PT Education - 07/13/20 1011    Education Details HEP    Person(s) Educated Patient    Methods Explanation    Comprehension Verbalized understanding              PT Short Term Goals - 05/20/20 0925      PT SHORT TERM GOAL #1   Title Patient will be independent in home exercise program to improve strength/mobility for better functional independence with ADLs.    Time 4    Period Weeks    Status Achieved    Target Date 05/14/20      PT SHORT TERM GOAL #2   Title Patient will be able to perform household work/ chores without increase in symptoms.    Time 4    Period Weeks    Status Partially Met   not exacerbated after activity, mayu be during depending on use   Target Date 05/14/20             PT Long Term Goals - 06/24/20 0934      PT LONG TERM GOAL #1   Title Patient will report a worst pain of 3/10 on VAS in left shoulder and elbow  to improve tolerance with ADLs and reduced symptoms with activities.    Baseline at reassessment 7/7: pain can still be higher than 3/10 (caregiver reports pain unchanged, or sometimes worse)06/24/20 Patient is having 0-1 pain in left elbow    Time 8    Period Weeks    Status Achieved    Target Date 06/24/20      PT LONG TERM GOAL #2   Title Patient will demonstrate adequate shoulder ROM and strength to be able to shave and dress independently with pain less than 3/10.    Baseline Achieved    Time 8    Period Weeks    Status Achieved      PT LONG TERM GOAL #3   Title Patient will decrease Quick DASH score by > 8 points demonstrating reduced self-reported upper extremity disability.06/24/20=29.5    Time 8    Period Weeks    Status Partially Met    Target Date 08/06/20                 Plan - 07/13/20 1012    Clinical Impression Statement Pt requires direction and verbal cues for correct performance of balance and strengthening exercises. Patient demonstrates weakness in BLE and performs open and closed chain exercises with no reports of increased pain. Pt was able to perform all exercises with min assist and VC for technique Pt encouraged continuing HEP and will be DC.Marland Kitchen Patient has  demonstrated improved UE strength, and decreased pain and  reached goals #1,2,3. . Patient  will be discharged from skilled physical therapy to HEP.   Personal Factors and Comorbidities Age;Comorbidity 1    Examination-Activity Limitations Bathing;Dressing;Carry;Reach Overhead;Sleep    Examination-Participation Restrictions Cleaning;Meal Prep;Community Activity;Yard Work    Stability/Clinical Decision Making Stable/Uncomplicated    Rehab Potential Fair    PT Frequency 2x / week    PT Duration 8 weeks    PT Treatment/Interventions Electrical Stimulation;Cryotherapy;ADLs/Self Care Home Management;Moist Heat;Ultrasound;Gait training;Therapeutic activities;Therapeutic exercise;Patient/family education;Manual techniques;Passive range of motion;Dry needling;Splinting;Taping;Joint Manipulations    PT Next Visit Plan manual therapy    Consulted and Agree with Plan of Care Patient;Family member/caregiver           Patient will benefit from skilled therapeutic intervention in order to improve the following deficits and impairments:  Decreased activity tolerance, Decreased endurance, Decreased range of motion, Decreased strength, Impaired flexibility, Impaired tone, Pain, Impaired UE functional use  Visit Diagnosis: Limited joint range of motion (ROM)  Muscle weakness (generalized)  Stiffness of left shoulder, not elsewhere classified     Problem List Patient Active Problem List   Diagnosis Date Noted  . Left rotator cuff tear 02/08/2018    Alanson Puls, PT DPT 07/13/2020, 10:15 AM  [place] MAIN [facility] SERVICES [address] [place], [place], [address] Phone: [phone]   Fax:  [phone]  Name: Shannon Donovan MRN: [id number] Date of Birth: January 30, 1951

## 2020-07-15 ENCOUNTER — Ambulatory Visit: Payer: Medicare Other | Admitting: Physical Therapy

## 2020-07-22 ENCOUNTER — Ambulatory Visit: Payer: Medicare Other | Admitting: Physical Therapy

## 2020-07-27 ENCOUNTER — Ambulatory Visit: Payer: Medicare Other | Admitting: Physical Therapy

## 2020-07-29 ENCOUNTER — Ambulatory Visit: Payer: Medicare Other | Admitting: Physical Therapy

## 2020-08-03 ENCOUNTER — Ambulatory Visit: Payer: Medicare Other | Admitting: Physical Therapy

## 2020-08-05 ENCOUNTER — Ambulatory Visit: Payer: Medicare Other | Admitting: Physical Therapy

## 2020-08-10 ENCOUNTER — Ambulatory Visit: Payer: Medicare Other | Admitting: Physical Therapy

## 2020-08-12 ENCOUNTER — Ambulatory Visit: Payer: Medicare Other | Admitting: Physical Therapy

## 2020-11-27 ENCOUNTER — Other Ambulatory Visit: Payer: Self-pay | Admitting: Internal Medicine

## 2020-11-27 DIAGNOSIS — Z1231 Encounter for screening mammogram for malignant neoplasm of breast: Secondary | ICD-10-CM

## 2020-12-21 ENCOUNTER — Ambulatory Visit: Payer: Medicare Other | Attending: Neurology | Admitting: Physical Therapy

## 2020-12-21 ENCOUNTER — Other Ambulatory Visit: Payer: Self-pay

## 2020-12-21 VITALS — BP 137/75 | HR 69

## 2020-12-21 DIAGNOSIS — M2560 Stiffness of unspecified joint, not elsewhere classified: Secondary | ICD-10-CM | POA: Diagnosis present

## 2020-12-21 DIAGNOSIS — M6281 Muscle weakness (generalized): Secondary | ICD-10-CM | POA: Insufficient documentation

## 2020-12-21 DIAGNOSIS — R269 Unspecified abnormalities of gait and mobility: Secondary | ICD-10-CM | POA: Diagnosis present

## 2020-12-21 DIAGNOSIS — R2689 Other abnormalities of gait and mobility: Secondary | ICD-10-CM | POA: Diagnosis not present

## 2020-12-21 DIAGNOSIS — M25612 Stiffness of left shoulder, not elsewhere classified: Secondary | ICD-10-CM | POA: Diagnosis present

## 2020-12-21 DIAGNOSIS — R2681 Unsteadiness on feet: Secondary | ICD-10-CM | POA: Insufficient documentation

## 2020-12-21 NOTE — Therapy (Signed)
[place] MAIN [facility] SERVICES [address] [place], [place], [address] Phone: [phone]   Fax:  [phone]  Physical Therapy Evaluation  Patient Details  Name: Shannon Donovan MRN: [id number] Date of Birth: December 29, 1950 Referring Provider (PT): Jennings Books   Encounter Date: 12/21/2020   PT End of Session - 12/21/20 1049    Visit Number 1    Number of Visits 17    Date for PT Re-Evaluation 02/15/21    Authorization Type eval: 02/07    PT Start Time 1005    PT Stop Time 1100    PT Time Calculation (min) 55 min    Equipment Utilized During Treatment Gait belt    Activity Tolerance Patient tolerated treatment well    Behavior During Therapy [facility] for tasks assessed/performed           Past Medical History:  Diagnosis Date  . Adolescent scoliosis   . Anxiety   . Atrial fibrillation ([place])   . Dysrhythmia    A fib  . History of chicken pox   . Hyperlipidemia   . Hyperplastic colon polyp 09/05/2016   Tubular adenoma of colon  . Hypertension    benign/ controlled  . Short-term memory loss   . Shoulder tendinitis    LEFT upper biceps tendonitis, rotator cuff tendinitis  . Stroke ([place]) [id number]   embolic CVA/ short term memory loss    Past Surgical History:  Procedure Laterality Date  . BACK SURGERY     x2 at age 43 and 75 for scoliosis  . COLONOSCOPY N/A 09/05/2016   Procedure: COLONOSCOPY;  Surgeon: Lollie Sails, MD;  Location: [facility] ENDOSCOPY;  Service: Endoscopy;  Laterality: N/A;  Pt on Coumadin (PT/ INR)  . SHOULDER ARTHROSCOPY WITH OPEN ROTATOR CUFF REPAIR Left 02/08/2018   Procedure: SHOULDER ARTHROSCOPY WITH OPEN ROTATOR CUFF REPAIR;  Surgeon: Corky Mull, MD;  Location: [facility] ORS;  Service: Orthopedics;  Laterality: Left;  . SHOULDER CLOSED REDUCTION Left 05/09/2018   Procedure: CLOSED MANIPULATION SHOULDER  UNDER ANESTHESIA WITH STEROID INJECTION;  Surgeon: Corky Mull, MD;  Location: [place];  Service:  Orthopedics;  Laterality: Left;  . TONSILLECTOMY      Vitals:   12/21/20 1024  BP: 137/75  Pulse: 69  SpO2: 100%      Subjective Assessment - 12/21/20 1016    Subjective Patient presents to physical therapy with her aide, Izora Gala. She states she likes to be called Shannon Donovan.    Patient is accompained by: --   CNA, Izora Gala   Pertinent History 70 y.o. female presents to skilled physical therapy with a recent referral of imbalance. 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 She states she is very cautious and slow when she is ambulating in the community. She does poorly with ambulating in areas with a combination of high ceilings and big crowds such as the mall. She reports of having 1 fall in the house in the past 6 months. She states she was getting up from a surface and slipped and when she tried to catch the fall then her foot turned wrong.    Limitations Lifting;Standing;Walking    How long can you sit comfortably? unlimited    How long can you stand comfortably? 30-45  mins    How long can you walk comfortably? 45-60 mins    Currently in Pain? No/denies              [facility]  PT Assessment - 12/21/20 1011      Assessment   Medical Diagnosis Balance    Referring Provider (PT) Manuella Ghazi, [place]    Hand Dominance Right      Precautions   Precautions Fall      Restrictions   Weight Bearing Restrictions No      Balance Screen   Has the patient fallen in the past 6 months Yes    How many times? 1    Has the patient had a decrease in activity level because of a fear of falling?  Yes    Is the patient reluctant to leave their home because of a fear of falling?  Yes      [place] Private residence    Living Arrangements Alone    Available Help at Discharge Personal care attendant    Type of [place] to enter    Entrance Stairs-Number of Steps 1    [place] One level    [place] - 2 wheels;Grab bars - toilet;Grab bars - tub/shower;Shower seat      Prior Function   Level of Independence Independent with household mobility without device    Vocation Retired    Leisure TV, read books      Cognition   Overall Cognitive Status Within Functional Limits for tasks assessed      6 Minute Walk- Baseline   6 Minute Walk- Baseline yes    BP (mmHg) 137/75    HR (bpm) 69    02 Sat (%RA) 100 %      6 Minute walk- Post Test   6 Minute Walk Post Test yes    HR (bpm) 64    02 Sat (%RA) 99 %    Perceived Rate of Exertion ([place]) 11- Fairly light      6 minute walk test results    Aerobic Endurance Distance Walked 725      Balance   Balance Assessed Yes      Standardized Balance Assessment   Standardized Balance Assessment Berg Balance Test;Timed Up and Go Test;Five Times Sit to Stand;10 meter walk test    Five times sit to stand comments  34.13    10 Meter Walk 0.84      Berg Balance Test   Sit to Stand Able to stand  independently using hands    Standing Unsupported Able to stand safely 2 minutes    Sitting with Back Unsupported but Feet Supported on Floor or Stool Able to sit safely and securely 2 minutes    Stand to Sit Controls descent by using hands    Transfers Able to transfer safely, definite need of hands    Standing Unsupported with Eyes Closed Able to stand 10 seconds with supervision    Standing Unsupported with Feet Together Able to place feet together independently and stand for 1 minute with supervision    From Standing, Reach Forward with Outstretched Arm Can reach forward >12 cm safely (5")    From Standing Position, Pick up Object from Floor Unable to try/needs assist to keep balance    From Standing Position, Turn to Look Behind Over each Shoulder Turn sideways only but maintains balance    Turn 360 Degrees Able to turn 360 degrees safely but slowly    Standing Unsupported, Alternately Place Feet on Step/Stool Able to complete 4  steps without aid or supervision  Standing Unsupported, One Foot in Front Needs help to step but can hold 15 seconds    Standing on One Leg Able to lift leg independently and hold equal to or more than 3 seconds    Total Score 35      Timed Up and Go Test   TUG Normal TUG    Normal TUG (seconds) 14.47            SUBJECTIVE Chief complaint: imbalance Recent changes in overall health/medication: No Prior history of physical therapy for balance: None Red flags (bowel/bladder changes, saddle paresthesia, personal history of cancer, chills/fever, night sweats, unrelenting pain) Negative   OBJECTIVE   Strength R/L 4-/4- Hip flexion  4-/4- Hip abduction 4-/4- Hip adduction 4/4 Knee extension 4/4 Knee flexion 4+/4+ Ankle Plantarflexion 4+/4+ Ankle Dorsiflexion   Coordination/Cerebellar Finger to Nose: WNL Heel to Shin: WNL Rapid alternating movements: WNL Finger Opposition: WNL Pronator Drift: Negative   FUNCTIONAL OUTCOME MEASURES 5STS: 34.13s TUG: 14.47s 10MWT: 0.84 m/s 6MWT: 725 ft    ASSESSMENT Clinical Impression:  70 y.o. pleasant female referred for difficulty with balance. PT examination reveals deficits in TUG score of 14.47 indicating patient is at risk for falls. She demonstrates deficits in BLE strength as evidenced by 5STS for 34.13 seconds to complete task. Patient has decreased muscle endurance and deficits in gait ability with ambulating for 725 ft on 6MWT. Patient presents with deficits in strength, gait, and balance. Patient will benefit from skilled PT services to address deficits in balance and decrease risk for future falls.    PLAN Next Visit: initiate strengthening and balance exercises        PT Short Term Goals - 12/21/20 1127      PT SHORT TERM GOAL #1   Title Patient will be independent in home exercise program to improve strength/mobility for better functional independence with ADLs.    Time 4    Period Weeks    Status New     Target Date 01/18/21      PT SHORT TERM GOAL #2   Title Patient will reduce timed up and go to <11 seconds to reduce fall risk and demonstrate improved transfer/gait ability.    Baseline 02/07: 11.93s    Time 4    Period Weeks    Status New    Target Date 01/18/21             PT Long Term Goals - 12/21/20 1130      PT LONG TERM GOAL #1   Title Patient will increase FOTO score to equal to or greater than to 67 demonstrate statistically significant improvement in mobility and quality of life.    Baseline 02/07: 60    Time 8    Period Weeks    Status New    Target Date 02/15/21      PT LONG TERM GOAL #2   Title Patient (> 108 years old) will complete five times sit to stand test in < 15 seconds indicating an increased LE strength and improved balance.    Baseline 02/07: 34.13s    Time 8    Period Weeks    Status New    Target Date 02/15/21      PT LONG TERM GOAL #3   Title Patient will increase Berg Balance score by > 6 points to demonstrate decreased fall risk during functional activities.    Baseline 02/07: 35/56    Time 8    Period Weeks    Status  New    Target Date 02/15/21      PT LONG TERM GOAL #4   Title Patient will increase six minute walk test distance to >1000 for progression to community ambulator and improve gait ability    Baseline 02/07: 725 ft    Time 8    Period Weeks    Status New    Target Date 02/15/21      PT LONG TERM GOAL #5   Title Patient will increase 10 meter walk test to >1.50m/s as to improve gait speed for better community ambulation and to reduce fall risk.    Baseline 02/07: 0.84 m/s    Time 8    Period Weeks    Status New    Target Date 02/15/21             Plan - 12/21/20 1238    Clinical Impression Statement 70 y.o. pleasant female referred for difficulty with balance. PT examination reveals deficits in TUG score of 14.47 indicating patient is at risk for falls. She demonstrates deficits in BLE strength as evidenced by 5STS  for 34.13 seconds to complete task. Patient has decreased muscle endurance and deficits in gait ability with ambulating for 725 ft on 6MWT. Patient presents with deficits in strength, gait, and balance. Patient will benefit from skilled PT services to address deficits in balance and decrease risk for future falls.    Personal Factors and Comorbidities Age;Comorbidity 1    Comorbidities L rotator cuff tear    Examination-Activity Limitations Bathing;Reach Overhead;Lift;Squat    Examination-Participation Restrictions Cleaning;Driving;Meal Prep    Stability/Clinical Decision Making Stable/Uncomplicated    Clinical Decision Making Moderate    Rehab Potential Fair    PT Frequency 2x / week    PT Duration 8 weeks    PT Treatment/Interventions Aquatic Therapy;Moist Heat;Gait training;Stair training;Functional mobility training;Therapeutic activities;Therapeutic exercise;Balance training;Neuromuscular re-education;Patient/family education;Manual lymph drainage;Passive range of motion;Dry needling    PT Next Visit Plan Initiate strengthening and balance exercises    Consulted and Agree with Plan of Care Patient;Family member/caregiver    Family Member Consulted aide, Izora Gala              Patient will benefit from skilled therapeutic intervention in order to improve the following deficits and impairments:  Abnormal gait,Decreased activity tolerance,Decreased endurance,Decreased strength,Decreased balance,Decreased mobility,Difficulty walking  Visit Diagnosis: Imbalance  Muscle weakness (generalized)  Unsteadiness on feet  Gait abnormality     Problem List Patient Active Problem List   Diagnosis Date Noted  . Left rotator cuff tear 02/08/2018   Karl Luke PT, DPT Netta Corrigan 12/21/2020, 12:42 PM  [place] MAIN [facility] SERVICES [address] [place], [place], [address] Phone: [phone]   Fax:  [phone]  Name: Shannon Donovan MRN:  [id number] Date of Birth: [birth year]

## 2020-12-23 ENCOUNTER — Other Ambulatory Visit: Payer: Self-pay

## 2020-12-23 ENCOUNTER — Ambulatory Visit: Payer: Medicare Other | Admitting: Physical Therapy

## 2020-12-23 DIAGNOSIS — R2689 Other abnormalities of gait and mobility: Secondary | ICD-10-CM | POA: Diagnosis not present

## 2020-12-23 DIAGNOSIS — R2681 Unsteadiness on feet: Secondary | ICD-10-CM

## 2020-12-23 DIAGNOSIS — M25612 Stiffness of left shoulder, not elsewhere classified: Secondary | ICD-10-CM

## 2020-12-23 DIAGNOSIS — M2560 Stiffness of unspecified joint, not elsewhere classified: Secondary | ICD-10-CM

## 2020-12-23 DIAGNOSIS — R269 Unspecified abnormalities of gait and mobility: Secondary | ICD-10-CM

## 2020-12-23 DIAGNOSIS — M6281 Muscle weakness (generalized): Secondary | ICD-10-CM

## 2020-12-23 NOTE — Therapy (Signed)
[place] MAIN [facility] SERVICES [address] [place], [place], [address] Phone: [phone]   Fax:  [phone]  Physical Therapy Treatment  Patient Details  Name: Shannon Donovan MRN: [id number] Date of Birth: Aug 30, 1951 Referring Provider (PT): Jennings Books   Encounter Date: 12/23/2020   PT End of Session - 12/23/20 1007    Visit Number 2    Number of Visits 17    Date for PT Re-Evaluation 02/15/21    Authorization Type eval: 02/07    PT Start Time 1000    PT Stop Time 1045    PT Time Calculation (min) 45 min    Equipment Utilized During Treatment Gait belt    Activity Tolerance Patient tolerated treatment well    Behavior During Therapy [facility] for tasks assessed/performed           Past Medical History:  Diagnosis Date  . Adolescent scoliosis   . Anxiety   . Atrial fibrillation ([place])   . Dysrhythmia    A fib  . History of chicken pox   . Hyperlipidemia   . Hyperplastic colon polyp 09/05/2016   Tubular adenoma of colon  . Hypertension    benign/ controlled  . Short-term memory loss   . Shoulder tendinitis    LEFT upper biceps tendonitis, rotator cuff tendinitis  . Stroke ([place]) 67/6720   embolic CVA/ short term memory loss    Past Surgical History:  Procedure Laterality Date  . BACK SURGERY     x2 at age 78 and 17 for scoliosis  . COLONOSCOPY N/A 09/05/2016   Procedure: COLONOSCOPY;  Surgeon: Lollie Sails, MD;  [facility] ENDOSCOPY;  Service: Endoscopy;  Laterality: N/A;  Pt on Coumadin (PT/ INR)  . SHOULDER ARTHROSCOPY WITH OPEN ROTATOR CUFF REPAIR Left 02/08/2018   Procedure: SHOULDER ARTHROSCOPY WITH OPEN ROTATOR CUFF REPAIR;  Surgeon: Corky Mull, MD;  Location: [facility] ORS;  Service: Orthopedics;  Laterality: Left;  . SHOULDER CLOSED REDUCTION Left 05/09/2018   Procedure: CLOSED MANIPULATION SHOULDER  UNDER ANESTHESIA WITH STEROID INJECTION;  Surgeon: Corky Mull, MD;  Location: [place];  Service:  Orthopedics;  Laterality: Left;  . TONSILLECTOMY      There were no vitals filed for this visit.   Subjective Assessment - 12/23/20 1006    Subjective Patient denies of any new pain or injuries since last therapy session. She states she feels good today. She had her blood drawn earlier today and everything went well.    Patient is accompained by: --   CNA, Izora Gala   Pertinent History 70 y.o. female presents to skilled physical therapy with a recent referral of imbalance. 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 She states she is very cautious and slow when she is ambulating in the community. She does poorly with ambulating in areas with a combination of high ceilings and big crowds such as the mall. She reports of having 1 fall in the house in the past 6 months. She states she was getting up from a surface and slipped and when she tried to catch the fall then her foot turned wrong.    Limitations Lifting;Standing;Walking    How long can you sit comfortably? unlimited    How long can you stand comfortably? 30-45  mins    How long can you walk comfortably? 45-60 mins    Currently in Pain? No/denies           Nu  Step x L1 x 5 mins with alternating BLE and RUE   All exercises were completed with 2# ankle weights:  Supine SLR hip flexion 2 x 10 BLE; Supine straight leg hip abduction 2 x 10 BLE, with manual resistance;   Hooklying bridges with arms across chest 2 x 10;  Seated LAQ  x 2 10 BLE; Seated marches x 2 10 BLE;  Mini squats with BUE support of chair x 2 10 reps Heel raises with BUE support of chair x 2 10 reps    Sit to stand from regular height chair without UE support 2 x 10;   Clinical Impression: Patient completed strengthening exercises with fair tolerance to activity. She requires constant verbal and visual cues for proper alignment and placement of hips and knees to adequately complete exercises. Patient demonstrates compensation of increased  trunk extension with unsupported seated exercises due to weakness. Patient will continue to benefit from skilled physical therapy to improve generalized strength, ROM, and capacity for functional activity.     PT Education - 12/23/20 1030    Education Details HEP: SLR, supine ABD, seated marching, bridging    Person(s) Educated Patient;Caregiver(s)    Methods Explanation;Demonstration    Comprehension Verbalized understanding            PT Short Term Goals - 12/21/20 1127      PT SHORT TERM GOAL #1   Title Patient will be independent in home exercise program to improve strength/mobility for better functional independence with ADLs.    Time 4    Period Weeks    Status New    Target Date 01/18/21      PT SHORT TERM GOAL #2   Title Patient will reduce timed up and go to <11 seconds to reduce fall risk and demonstrate improved transfer/gait ability.    Baseline 02/07: 11.93s    Time 4    Period Weeks    Status New    Target Date 01/18/21             PT Long Term Goals - 12/21/20 1130      PT LONG TERM GOAL #1   Title Patient will increase FOTO score to equal to or greater than to 67 demonstrate statistically significant improvement in mobility and quality of life.    Baseline 02/07: 60    Time 8    Period Weeks    Status New    Target Date 02/15/21      PT LONG TERM GOAL #2   Title Patient (> 3 years old) will complete five times sit to stand test in < 15 seconds indicating an increased LE strength and improved balance.    Baseline 02/07: 34.13s    Time 8    Period Weeks    Status New    Target Date 02/15/21      PT LONG TERM GOAL #3   Title Patient will increase Berg Balance score by > 6 points to demonstrate decreased fall risk during functional activities.    Baseline 02/07: 35/56    Time 8    Period Weeks    Status New    Target Date 02/15/21      PT LONG TERM GOAL #4   Title Patient will increase six minute walk test distance to >1000 for progression to  community ambulator and improve gait ability    Baseline 02/07: 725 ft    Time 8    Period Weeks    Status New  Target Date 02/15/21      PT LONG TERM GOAL #5   Title Patient will increase 10 meter walk test to >1.73m/s as to improve gait speed for better community ambulation and to reduce fall risk.    Baseline 02/07: 0.84 m/s    Time 8    Period Weeks    Status New    Target Date 02/15/21                 Plan - 12/23/20 1030    Clinical Impression Statement Patient completed strengthening exercises with fair tolerance to activity. She requires constant verbal and visual cues for proper alignment and placement of hips and knees to adequately complete exercises. Patient demonstrates compensation of increased trunk extension with unsupported seated exercises due to weakness. Patient will continue to benefit from skilled physical therapy to improve generalized strength, ROM, and capacity for functional activity.    Personal Factors and Comorbidities Age;Comorbidity 1    Comorbidities L rotator cuff tear    Examination-Activity Limitations Bathing;Reach Overhead;Lift;Squat    Examination-Participation Restrictions Cleaning;Driving;Meal Prep    Stability/Clinical Decision Making Stable/Uncomplicated    Rehab Potential Fair    PT Frequency 2x / week    PT Duration 8 weeks    PT Treatment/Interventions Aquatic Therapy;Moist Heat;Gait training;Stair training;Functional mobility training;Therapeutic activities;Therapeutic exercise;Balance training;Neuromuscular re-education;Patient/family education;Manual lymph drainage;Passive range of motion;Dry needling    PT Next Visit Plan Initiate strengthening and balance exercises    Consulted and Agree with Plan of Care Patient;Family member/caregiver    Family Member Consulted aide, Izora Gala           Patient will benefit from skilled therapeutic intervention in order to improve the following deficits and impairments:  Abnormal  gait,Decreased activity tolerance,Decreased endurance,Decreased strength,Decreased balance,Decreased mobility,Difficulty walking  Visit Diagnosis: Muscle weakness (generalized)  Unsteadiness on feet  Gait abnormality  Imbalance  Limited joint range of motion (ROM)  Stiffness of left shoulder, not elsewhere classified     Problem List Patient Active Problem List   Diagnosis Date Noted  . Left rotator cuff tear 02/08/2018   Karl Luke PT, DPT Netta Corrigan 12/23/2020, 10:51 AM  [place] MAIN [facility] SERVICES [address] [place], [place], [address] Phone: [phone]   Fax:  [phone]  Name: Shannon Donovan MRN: [id number] Date of Birth: 09-05-1951

## 2020-12-28 ENCOUNTER — Ambulatory Visit: Payer: Medicare Other

## 2020-12-28 ENCOUNTER — Other Ambulatory Visit: Payer: Self-pay

## 2020-12-28 DIAGNOSIS — R2689 Other abnormalities of gait and mobility: Secondary | ICD-10-CM | POA: Diagnosis not present

## 2020-12-28 DIAGNOSIS — R2681 Unsteadiness on feet: Secondary | ICD-10-CM

## 2020-12-28 DIAGNOSIS — M6281 Muscle weakness (generalized): Secondary | ICD-10-CM

## 2020-12-28 DIAGNOSIS — R269 Unspecified abnormalities of gait and mobility: Secondary | ICD-10-CM

## 2020-12-28 NOTE — Therapy (Signed)
[place] MAIN [facility] SERVICES [address] [place], [place], [address] Phone: [phone]   Fax:  [phone]  Physical Therapy Treatment  Patient Details  Name: Shannon Donovan MRN: [id number] Date of Birth: Aug 10, 1951 Referring Provider (PT): Jennings Books   Encounter Date: 12/28/2020   PT End of Session - 12/28/20 1718    Visit Number 3    Number of Visits 17    Date for PT Re-Evaluation 02/15/21    Authorization Type eval: 02/07    PT Start Time 1012    PT Stop Time 1059    PT Time Calculation (min) 47 min    Equipment Utilized During Treatment Gait belt    Activity Tolerance Patient tolerated treatment well    Behavior During Therapy [facility] for tasks assessed/performed           Past Medical History:  Diagnosis Date  . Adolescent scoliosis   . Anxiety   . Atrial fibrillation ([place])   . Dysrhythmia    A fib  . History of chicken pox   . Hyperlipidemia   . Hyperplastic colon polyp 09/05/2016   Tubular adenoma of colon  . Hypertension    benign/ controlled  . Short-term memory loss   . Shoulder tendinitis    LEFT upper biceps tendonitis, rotator cuff tendinitis  . 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 BACK SURGERY     x2 at age 51 and 89 for scoliosis  . COLONOSCOPY N/A 09/05/2016   Procedure: COLONOSCOPY;  Surgeon: Lollie Sails, MD;  Location: [facility] ENDOSCOPY;  Service: Endoscopy;  Laterality: N/A;  Pt on Coumadin (PT/ INR)  . SHOULDER ARTHROSCOPY WITH OPEN ROTATOR CUFF REPAIR Left 02/08/2018   Procedure: SHOULDER ARTHROSCOPY WITH OPEN ROTATOR CUFF REPAIR;  Surgeon: Corky Mull, MD;  Location: [facility] ORS;  Service: Orthopedics;  Laterality: Left;  . SHOULDER CLOSED REDUCTION Left 05/09/2018   Procedure: CLOSED MANIPULATION SHOULDER  UNDER ANESTHESIA WITH STEROID INJECTION;  Surgeon: Corky Mull, MD;  Location: [place];  Service:  Orthopedics;  Laterality: Left;  . TONSILLECTOMY      There were no vitals filed for this visit.  Treatment:  Therapeutic Exercise  Nu Step x L1 x 5 mins with alternating BLE and RUE; VC to increase SPM to 52   All exercises were completed with 2# ankle weights:     Seated LAQ  x 2 15 BLE; pt reports exercise is "easy-medium," 2.5# 1x15  Seated marches 2x12, 1x10 BLE; TC/VC for technique    Heel raises with BUE support 3x12 reps VC/TC for technique.   Sit to stand from regular height chair without UE support 2x10, 1x5; VC/TC for technique   Standing marches - 1x15 with UE support  Seated RTB hip abduction 1x15, 1x20; VC/demontration for technique  Seated RTB hamstring curls 1x15 pt reports exercise as "easy-medium"    Assessment: Increased volume for majority of therex today and when pt provided rating of difficulty pt stated exercises were easy-medium, indicating pt could be progressed either on resistance of volume next session. Pt still requires cuing for technique with sit<>stand. Pt will benefit from further skilled therapy to improve strength, ROM and capacity for functional activity.      PT Education - 12/28/20 1717    Education Details Pt educated on technique with RTB hamstring curls    Person(s) Educated Patient  Methods Explanation;Demonstration;Verbal cues    Comprehension Verbalized understanding;Returned demonstration            PT Short Term Goals - 12/21/20 1127      PT SHORT TERM GOAL #1   Title Patient will be independent in home exercise program to improve strength/mobility for better functional independence with ADLs.    Time 4    Period Weeks    Status New    Target Date 01/18/21      PT SHORT TERM GOAL #2   Title Patient will reduce timed up and go to <11 seconds to reduce fall risk and demonstrate improved transfer/gait ability.    Baseline 02/07: 11.93s    Time 4    Period Weeks    Status New    Target Date 01/18/21              PT Long Term Goals - 12/21/20 1130      PT LONG TERM GOAL #1   Title Patient will increase FOTO score to equal to or greater than to 67 demonstrate statistically significant improvement in mobility and quality of life.    Baseline 02/07: 60    Time 8    Period Weeks    Status New    Target Date 02/15/21      PT LONG TERM GOAL #2   Title Patient (> 42 years old) will complete five times sit to stand test in < 15 seconds indicating an increased LE strength and improved balance.    Baseline 02/07: 34.13s    Time 8    Period Weeks    Status New    Target Date 02/15/21      PT LONG TERM GOAL #3   Title Patient will increase Berg Balance score by > 6 points to demonstrate decreased fall risk during functional activities.    Baseline 02/07: 35/56    Time 8    Period Weeks    Status New    Target Date 02/15/21      PT LONG TERM GOAL #4   Title Patient will increase six minute walk test distance to >1000 for progression to community ambulator and improve gait ability    Baseline 02/07: 725 ft    Time 8    Period Weeks    Status New    Target Date 02/15/21      PT LONG TERM GOAL #5   Title Patient will increase 10 meter walk test to >1.35m/s as to improve gait speed for better community ambulation and to reduce fall risk.    Baseline 02/07: 0.84 m/s    Time 8    Period Weeks    Status New    Target Date 02/15/21                 Plan - 12/28/20 1725    Clinical Impression Statement Increased volume for majority of therex today and when pt provided rating of difficulty pt stated exercises were easy-medium, indicating pt could be progressed either on resistance of volume next session. Pt still requires cuing for technique with sit<>stand. Pt will benefit from further skilled therapy to improve strength, ROM and capacity for functional activity.    Personal Factors and Comorbidities Age;Comorbidity 1    Comorbidities L rotator cuff tear    Examination-Activity  Limitations Bathing;Reach Overhead;Lift;Squat    Examination-Participation Restrictions Cleaning;Driving;Meal Prep    Stability/Clinical Decision Making Stable/Uncomplicated    Rehab Potential Fair    PT Frequency 2x /  week    PT Duration 8 weeks    PT Treatment/Interventions Aquatic Therapy;Moist Heat;Gait training;Stair training;Functional mobility training;Therapeutic activities;Therapeutic exercise;Balance training;Neuromuscular re-education;Patient/family education;Manual lymph drainage;Passive range of motion;Dry needling    PT Next Visit Plan Initiate strengthening and balance exercises; increase resistance of strength exercises    Consulted and Agree with Plan of Care Patient;Family member/caregiver    Family Member Consulted aide, Izora Gala           Patient will benefit from skilled therapeutic intervention in order to improve the following deficits and impairments:  Abnormal gait,Decreased activity tolerance,Decreased endurance,Decreased strength,Decreased balance,Decreased mobility,Difficulty walking  Visit Diagnosis: Muscle weakness (generalized)  Unsteadiness on feet  Gait abnormality     Problem List Patient Active Problem List   Diagnosis Date Noted  . Left rotator cuff tear 02/08/2018   Ricard Dillon PT, DPT 12/28/2020, 5:26 PM  [place] MAIN [facility] SERVICES [address] [place], [place], [address] Phone: [phone]   Fax:  [phone]  Name: Shannon Donovan MRN: [id number] Date of Birth: 03-07-1951

## 2020-12-30 ENCOUNTER — Ambulatory Visit: Payer: Medicare Other

## 2020-12-30 ENCOUNTER — Other Ambulatory Visit: Payer: Self-pay

## 2020-12-30 DIAGNOSIS — R2689 Other abnormalities of gait and mobility: Secondary | ICD-10-CM

## 2020-12-30 DIAGNOSIS — M6281 Muscle weakness (generalized): Secondary | ICD-10-CM

## 2020-12-30 DIAGNOSIS — R2681 Unsteadiness on feet: Secondary | ICD-10-CM

## 2020-12-30 DIAGNOSIS — M2560 Stiffness of unspecified joint, not elsewhere classified: Secondary | ICD-10-CM

## 2020-12-30 DIAGNOSIS — M25612 Stiffness of left shoulder, not elsewhere classified: Secondary | ICD-10-CM

## 2020-12-30 DIAGNOSIS — R269 Unspecified abnormalities of gait and mobility: Secondary | ICD-10-CM

## 2020-12-30 NOTE — Therapy (Signed)
[place] MAIN [facility] SERVICES [address] [place], [place], [address] Phone: [phone]   Fax:  [phone]  Physical Therapy Treatment  Patient Details  Name: Shannon Donovan MRN: [id number] Date of Birth: 1950/12/15 Referring Provider (PT): Jennings Books   Encounter Date: 12/30/2020   PT End of Session - 12/30/20 1125    Visit Number 4    Number of Visits 17    Date for PT Re-Evaluation 02/15/21    Authorization Type eval: 02/07    PT Start Time 1108    PT Stop Time 1147    PT Time Calculation (min) 39 min    Equipment Utilized During Treatment Gait belt    Activity Tolerance Patient tolerated treatment well    Behavior During Therapy [facility] for tasks assessed/performed           Past Medical History:  Diagnosis Date  . Adolescent scoliosis   . Anxiety   . Atrial fibrillation ([place])   . Dysrhythmia    A fib  . History of chicken pox   . Hyperlipidemia   . Hyperplastic colon polyp 09/05/2016   Tubular adenoma of colon  . Hypertension    benign/ controlled  . Short-term memory loss   . Shoulder tendinitis    LEFT upper biceps tendonitis, rotator cuff tendinitis  . Stroke ([place]) 26/3785   embolic CVA/ short term memory loss    Past Surgical History:  Procedure Laterality Date  . BACK SURGERY     x2 at age 51 and 58 for scoliosis  . COLONOSCOPY N/A 09/05/2016   Procedure: COLONOSCOPY;  Surgeon: Lollie Sails, MD;  Location: [facility] ENDOSCOPY;  Service: Endoscopy;  Laterality: N/A;  Pt on Coumadin (PT/ INR)  . SHOULDER ARTHROSCOPY WITH OPEN ROTATOR CUFF REPAIR Left 02/08/2018   Procedure: SHOULDER ARTHROSCOPY WITH OPEN ROTATOR CUFF REPAIR;  Surgeon: Corky Mull, MD;  Location: [facility] ORS;  Service: Orthopedics;  Laterality: Left;  . SHOULDER CLOSED REDUCTION Left 05/09/2018   Procedure: CLOSED MANIPULATION SHOULDER  UNDER ANESTHESIA WITH STEROID INJECTION;  Surgeon: Corky Mull, MD;  Location: [place];  Service:  Orthopedics;  Laterality: Left;  . TONSILLECTOMY      There were no vitals filed for this visit.   Patent goes by "Shannon Donovan"  Subjective Assessment - 12/30/20 1121    Subjective The patient reports she is doing well, shoulder not really bothering her.    Patient is accompained by: --   CNA, Izora Gala   Pertinent History 70 y.o. female presents to skilled physical therapy with a recent referral of imbalance. 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 She states she is very cautious and slow when she is ambulating in the community. She does poorly with ambulating in areas with a combination of high ceilings and big crowds such as the mall. She reports of having 1 fall in the house in the past 6 months. She states she was getting up from a surface and slipped and when she tried to catch the fall then her foot turned wrong.    Limitations Lifting;Standing;Walking    How long can you sit comfortably? unlimited    How long can you stand comfortably? 30-45  mins    How long can you walk comfortably? 45-60 mins    Currently in Pain? No/denies    Pain Score 0-No pain           Nu Step x L1 x 5  mins with alternating BLE and RUE; VC to increase SPM to 52   All exercises were completed with 2.5# ankle weights:   Seated LAQ 3" 2x15BLE   Seated marches 2x15 TC/VC for technique   Heel raises with BUE support 2x15 reps   Sit to stand from regular height chair light bilateral UE support x10, no UE support x10  Seated GTB hip abduction 2x15  Seated GTB hamstring curls 2x15   Standing marches - 1x15 with UE support  Airex WBOS EO/EC 30"x1 each Airex NBOS EO/EC 30"x1 each Airex Semi tandem balance 30"x1 each                            PT Education - 12/30/20 1123    Education Details exercise technique    Person(s) Educated Patient    Methods Explanation    Comprehension Verbalized understanding            PT Short Term Goals -  12/21/20 1127      PT SHORT TERM GOAL #1   Title Patient will be independent in home exercise program to improve strength/mobility for better functional independence with ADLs.    Time 4    Period Weeks    Status New    Target Date 01/18/21      PT SHORT TERM GOAL #2   Title Patient will reduce timed up and go to <11 seconds to reduce fall risk and demonstrate improved transfer/gait ability.    Baseline 02/07: 11.93s    Time 4    Period Weeks    Status New    Target Date 01/18/21             PT Long Term Goals - 12/21/20 1130      PT LONG TERM GOAL #1   Title Patient will increase FOTO score to equal to or greater than to 67 demonstrate statistically significant improvement in mobility and quality of life.    Baseline 02/07: 60    Time 8    Period Weeks    Status New    Target Date 02/15/21      PT LONG TERM GOAL #2   Title Patient (> 6 years old) will complete five times sit to stand test in < 15 seconds indicating an increased LE strength and improved balance.    Baseline 02/07: 34.13s    Time 8    Period Weeks    Status New    Target Date 02/15/21      PT LONG TERM GOAL #3   Title Patient will increase Berg Balance score by > 6 points to demonstrate decreased fall risk during functional activities.    Baseline 02/07: 35/56    Time 8    Period Weeks    Status New    Target Date 02/15/21      PT LONG TERM GOAL #4   Title Patient will increase six minute walk test distance to >1000 for progression to community ambulator and improve gait ability    Baseline 02/07: 725 ft    Time 8    Period Weeks    Status New    Target Date 02/15/21      PT LONG TERM GOAL #5   Title Patient will increase 10 meter walk test to >1.23m/s as to improve gait speed for better community ambulation and to reduce fall risk.    Baseline 02/07: 0.84 m/s    Time 8  Period Weeks    Status New    Target Date 02/15/21                 Plan - 12/30/20 1150    Clinical  Impression Statement Advanced strengthening program this visit with good tolerance.  Initiated static balance activities on uneven surface this visit which was challenging for the patient.  Patient presenting with decreased stability and needed min A with tandem stance.  The patient continues to benefit from skilled PT services to improve LE strength and balance for improved quality of life and decreased fall risk.    Personal Factors and Comorbidities Age;Comorbidity 1    Comorbidities L rotator cuff tear    Examination-Activity Limitations Bathing;Reach Overhead;Lift;Squat    Examination-Participation Restrictions Cleaning;Driving;Meal Prep    Stability/Clinical Decision Making Stable/Uncomplicated    Rehab Potential Fair    PT Frequency 2x / week    PT Duration 8 weeks    PT Treatment/Interventions Aquatic Therapy;Moist Heat;Gait training;Stair training;Functional mobility training;Therapeutic activities;Therapeutic exercise;Balance training;Neuromuscular re-education;Patient/family education;Manual lymph drainage;Passive range of motion;Dry needling    PT Next Visit Plan Initiate strengthening and balance exercises; increase resistance of strength exercises    Consulted and Agree with Plan of Care Patient;Family member/caregiver    Family Member Consulted aide, Izora Gala           Patient will benefit from skilled therapeutic intervention in order to improve the following deficits and impairments:  Abnormal gait,Decreased activity tolerance,Decreased endurance,Decreased strength,Decreased balance,Decreased mobility,Difficulty walking  Visit Diagnosis: Muscle weakness (generalized)  Unsteadiness on feet  Gait abnormality  Imbalance  Limited joint range of motion (ROM)  Stiffness of left shoulder, not elsewhere classified     Problem List Patient Active Problem List   Diagnosis Date Noted  . Left rotator cuff tear 02/08/2018    Hal Morales PT, DPT 12/30/2020, 11:52 AM  [place] MAIN [facility] SERVICES [address] [place], [place], [address] Phone: [phone]   Fax:  [phone]  Name: Shannon Donovan MRN: [id number] Date of Birth: 23-Sep-1951

## 2021-01-01 ENCOUNTER — Ambulatory Visit
Admission: RE | Admit: 2021-01-01 | Discharge: 2021-01-01 | Disposition: A | Discharge status: Home / Self Care | Payer: Medicare Other | Source: Ambulatory Visit | Attending: Internal Medicine | Admitting: Internal Medicine

## 2021-01-01 ENCOUNTER — Other Ambulatory Visit: Payer: Self-pay

## 2021-01-01 DIAGNOSIS — Z1231 Encounter for screening mammogram for malignant neoplasm of breast: Secondary | ICD-10-CM | POA: Insufficient documentation

## 2021-01-04 ENCOUNTER — Ambulatory Visit: Payer: Medicare Other

## 2021-01-06 ENCOUNTER — Ambulatory Visit: Payer: Medicare Other

## 2021-01-06 ENCOUNTER — Other Ambulatory Visit: Payer: Self-pay

## 2021-01-06 DIAGNOSIS — R269 Unspecified abnormalities of gait and mobility: Secondary | ICD-10-CM

## 2021-01-06 DIAGNOSIS — M2560 Stiffness of unspecified joint, not elsewhere classified: Secondary | ICD-10-CM

## 2021-01-06 DIAGNOSIS — M25612 Stiffness of left shoulder, not elsewhere classified: Secondary | ICD-10-CM

## 2021-01-06 DIAGNOSIS — R2681 Unsteadiness on feet: Secondary | ICD-10-CM

## 2021-01-06 DIAGNOSIS — R2689 Other abnormalities of gait and mobility: Secondary | ICD-10-CM | POA: Diagnosis not present

## 2021-01-06 DIAGNOSIS — M6281 Muscle weakness (generalized): Secondary | ICD-10-CM

## 2021-01-06 NOTE — Therapy (Signed)
[place] MAIN [facility] SERVICES [address] [place], [place], [address] Phone: [phone]   Fax:  [phone]  Physical Therapy Treatment  Patient Details  Name: Shannon Donovan MRN: [id number] Date of Birth: 1951-09-10 Referring Provider (PT): Jennings Books   Encounter Date: 01/06/2021   PT End of Session - 01/06/21 1016    Visit Number 5    Number of Visits 17    Date for PT Re-Evaluation 02/15/21    Authorization Type eval: 02/07    PT Start Time 1015    PT Stop Time 1057    PT Time Calculation (min) 42 min    Equipment Utilized During Treatment Gait belt    Activity Tolerance Patient tolerated treatment well    Behavior During Therapy [facility] for tasks assessed/performed           Past Medical History:  Diagnosis Date  . Adolescent scoliosis   . Anxiety   . Atrial fibrillation ([place])   . Dysrhythmia    A fib  . History of chicken pox   . Hyperlipidemia   . Hyperplastic colon polyp 09/05/2016   Tubular adenoma of colon  . Hypertension    benign/ controlled  . Short-term memory loss   . Shoulder tendinitis    LEFT upper biceps tendonitis, rotator cuff tendinitis  . Stroke ([place]) 59/9357   embolic CVA/ short term memory loss    Past Surgical History:  Procedure Laterality Date  . BACK SURGERY     x2 at age 20 and 17 for scoliosis  . COLONOSCOPY N/A 09/05/2016   Procedure: COLONOSCOPY;  Surgeon: Lollie Sails, MD;  Location: [facility] ENDOSCOPY;  Service: Endoscopy;  Laterality: N/A;  Pt on Coumadin (PT/ INR)  . SHOULDER ARTHROSCOPY WITH OPEN ROTATOR CUFF REPAIR Left 02/08/2018   Procedure: SHOULDER ARTHROSCOPY WITH OPEN ROTATOR CUFF REPAIR;  Surgeon: Corky Mull, MD;  Location: [facility] ORS;  Service: Orthopedics;  Laterality: Left;  . SHOULDER CLOSED REDUCTION Left 05/09/2018   Procedure: CLOSED MANIPULATION SHOULDER  UNDER ANESTHESIA WITH STEROID INJECTION;  Surgeon: Corky Mull, MD;  Location: [place];  Service:  Orthopedics;  Laterality: Left;  . TONSILLECTOMY      There were no vitals filed for this visit.   Subjective Assessment - 01/06/21 1037    Subjective The patient reports she is doing well.  no falls or LOB.    Patient is accompained by: --   CNA, Izora Gala   Pertinent History 70 y.o. female presents to skilled physical therapy with a recent referral of imbalance. 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 She states she is very cautious and slow when she is ambulating in the community. She does poorly with ambulating in areas with a combination of high ceilings and big crowds such as the mall. She reports of having 1 fall in the house in the past 6 months. She states she was getting up from a surface and slipped and when she tried to catch the fall then her foot turned wrong.    Limitations Lifting;Standing;Walking    How long can you sit comfortably? unlimited    How long can you stand comfortably? 30-45  mins    How long can you walk comfortably? 45-60 mins    Currently in Pain? No/denies          Nu Step x L1 x 5 mins with alternating BLE and RUE; VC to increase SPM to 52  2.5# ankle weights:  (no weights today)  Seated LAQ  3" 2x15 BLE   Seated marches 2x15 TC/VC for technique     Heel raises with BUE support 2x15 reps    Seated GTB hip abduction 2x15   Seated GTB hamstring curls 2x15    Standing marches - 1x15 with UE support   Airex WBOS EO/EC 30"x1 each, heavy forward lean Airex NBOS EO/EC 30"x1 each, heavy forward lean Airex Semi tandem balance 30"x1 each   [place] Balance Tux Racer x6 trials- arm rests at level 1.  Pt reported left shoulder pain, gave cues to not use left UE for support however patient kept 2 hands on bars during exercise.                           PT Education - 01/06/21 1027    Education Details exercises,  balance    Person(s) Educated Patient    Methods Explanation    Comprehension Verbalized  understanding            PT Short Term Goals - 12/21/20 1127      PT SHORT TERM GOAL #1   Title Patient will be independent in home exercise program to improve strength/mobility for better functional independence with ADLs.    Time 4    Period Weeks    Status New    Target Date 01/18/21      PT SHORT TERM GOAL #2   Title Patient will reduce timed up and go to <11 seconds to reduce fall risk and demonstrate improved transfer/gait ability.    Baseline 02/07: 11.93s    Time 4    Period Weeks    Status New    Target Date 01/18/21             PT Long Term Goals - 12/21/20 1130      PT LONG TERM GOAL #1   Title Patient will increase FOTO score to equal to or greater than to 67 demonstrate statistically significant improvement in mobility and quality of life.    Baseline 02/07: 60    Time 8    Period Weeks    Status New    Target Date 02/15/21      PT LONG TERM GOAL #2   Title Patient (> 51 years old) will complete five times sit to stand test in < 15 seconds indicating an increased LE strength and improved balance.    Baseline 02/07: 34.13s    Time 8    Period Weeks    Status New    Target Date 02/15/21      PT LONG TERM GOAL #3   Title Patient will increase Berg Balance score by > 6 points to demonstrate decreased fall risk during functional activities.    Baseline 02/07: 35/56    Time 8    Period Weeks    Status New    Target Date 02/15/21      PT LONG TERM GOAL #4   Title Patient will increase six minute walk test distance to >1000 for progression to community ambulator and improve gait ability    Baseline 02/07: 725 ft    Time 8    Period Weeks    Status New    Target Date 02/15/21      PT LONG TERM GOAL #5   Title Patient will increase 10 meter walk test to >1.47m/s as to improve gait speed for better community ambulation  and to reduce fall risk.    Baseline 02/07: 0.84 m/s    Time 8    Period Weeks    Status New    Target Date 02/15/21                  Plan - 01/06/21 1036    Clinical Impression Statement The patient was challenged with balance activities requiring light use of bilateral UE support to maintain balance.  Patient demonstrated heavy forward lean with static balance exercises.  The patient continues to benefit from additional skilled PT services to improve balance and LE strength for improved functional capacity.    Personal Factors and Comorbidities Age;Comorbidity 1    Comorbidities L rotator cuff tear    Examination-Activity Limitations Bathing;Reach Overhead;Lift;Squat    Examination-Participation Restrictions Cleaning;Driving;Meal Prep    Stability/Clinical Decision Making Stable/Uncomplicated    Rehab Potential Fair    PT Frequency 2x / week    PT Duration 8 weeks    PT Treatment/Interventions Aquatic Therapy;Moist Heat;Gait training;Stair training;Functional mobility training;Therapeutic activities;Therapeutic exercise;Balance training;Neuromuscular re-education;Patient/family education;Manual lymph drainage;Passive range of motion;Dry needling    PT Next Visit Plan Initiate strengthening and balance exercises; increase resistance of strength exercises    Consulted and Agree with Plan of Care Patient;Family member/caregiver    Family Member Consulted aide, Izora Gala           Patient will benefit from skilled therapeutic intervention in order to improve the following deficits and impairments:  Abnormal gait,Decreased activity tolerance,Decreased endurance,Decreased strength,Decreased balance,Decreased mobility,Difficulty walking  Visit Diagnosis: Muscle weakness (generalized)  Unsteadiness on feet  Gait abnormality  Imbalance  Limited joint range of motion (ROM)  Stiffness of left shoulder, not elsewhere classified     Problem List Patient Active Problem List   Diagnosis Date Noted  . Left rotator cuff tear 02/08/2018    Hal Morales PT, DPT 01/06/2021, 11:03 AM  [employer] [employer] MAIN [facility] SERVICES [address] [place], [place], [address] Phone: [phone]   Fax:  [phone]  Name: Shannon Donovan MRN: [id number] Date of Birth: [birth year]

## 2021-01-08 ENCOUNTER — Other Ambulatory Visit: Payer: Self-pay

## 2021-01-08 ENCOUNTER — Ambulatory Visit: Payer: Medicare Other

## 2021-01-08 DIAGNOSIS — R2689 Other abnormalities of gait and mobility: Secondary | ICD-10-CM

## 2021-01-08 DIAGNOSIS — M6281 Muscle weakness (generalized): Secondary | ICD-10-CM

## 2021-01-08 DIAGNOSIS — R2681 Unsteadiness on feet: Secondary | ICD-10-CM

## 2021-01-08 DIAGNOSIS — R269 Unspecified abnormalities of gait and mobility: Secondary | ICD-10-CM

## 2021-01-08 NOTE — Therapy (Signed)
[place] MAIN [facility] SERVICES [address] [place], [place], [address] Phone: [phone]   Fax:  [phone]  Physical Therapy Treatment  Patient Details  Name: Shannon Donovan MRN: [id number] Date of Birth: 03/31/1951 Referring Provider (PT): Jennings Books   Encounter Date: 01/08/2021   PT End of Session - 01/08/21 1110    Visit Number 6    Number of Visits 17    Date for PT Re-Evaluation 02/15/21    Authorization Type eval: 02/07    PT Start Time 1013    PT Stop Time 1055    PT Time Calculation (min) 42 min    Equipment Utilized During Treatment Gait belt    Activity Tolerance Patient tolerated treatment well    Behavior During Therapy [facility] for tasks assessed/performed           Past Medical History:  Diagnosis Date  . Adolescent scoliosis   . Anxiety   . Atrial fibrillation ([place])   . Dysrhythmia    A fib  . History of chicken pox   . Hyperlipidemia   . Hyperplastic colon polyp 09/05/2016   Tubular adenoma of colon  . Hypertension    benign/ controlled  . Short-term memory loss   . Shoulder tendinitis    LEFT upper biceps tendonitis, rotator cuff tendinitis  . 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 BACK SURGERY     x2 at age 25 and 54 for scoliosis  . COLONOSCOPY N/A 09/05/2016   Procedure: COLONOSCOPY;  Surgeon: Lollie Sails, MD;  Location: [facility] ENDOSCOPY;  Service: Endoscopy;  Laterality: N/A;  Pt on Coumadin (PT/ INR)  . SHOULDER ARTHROSCOPY WITH OPEN ROTATOR CUFF REPAIR Left 02/08/2018   Procedure: SHOULDER ARTHROSCOPY WITH OPEN ROTATOR CUFF REPAIR;  Surgeon: Corky Mull, MD;  Location: [facility] ORS;  Service: Orthopedics;  Laterality: Left;  . SHOULDER CLOSED REDUCTION Left 05/09/2018   Procedure: CLOSED MANIPULATION SHOULDER  UNDER ANESTHESIA WITH STEROID INJECTION;  Surgeon: Corky Mull, MD;  Location: [place];  Service:  Orthopedics;  Laterality: Left;  . TONSILLECTOMY      There were no vitals filed for this visit.   Subjective Assessment - 01/08/21 1019    Subjective Patient reports doing well and that her shoulder has its normal pain    Patient is accompained by: --   CNA, Izora Gala   Pertinent History 70 y.o. female presents to skilled physical therapy with a recent referral of imbalance. 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 She states she is very cautious and slow when she is ambulating in the community. She does poorly with ambulating in areas with a combination of high ceilings and big crowds such as the mall. She reports of having 1 fall in the house in the past 6 months. She states she was getting up from a surface and slipped and when she tried to catch the fall then her foot turned wrong.    Limitations Lifting;Standing;Walking    How long can you sit comfortably? unlimited    How long can you stand comfortably? 30-45  mins    How long can you walk comfortably? 45-60 mins    Currently in Pain? Yes    Pain Score 3     Pain Location Shoulder    Pain Orientation Left    Pain Type Chronic pain  Pain Onset More than a month ago          Nu Step x L1 x 5 mins with right UE only SPM >50  2.5# ankle weights: Seated LAQ 3" 2x15BLE  Seated marches2x15TC/VC for technique  SeatedGTB hip abduction2x15 SeatedGTB hamstring curls2x15   Heel raises with BUE support2x15reps  Standing marches-1x15with UE support  CGA and intermittent UE use for balance exercises Airex Beam forward tandem walk x 5 laps Airex Bearm side stepping x5 laps                             PT Education - 01/08/21 1020    Education Details exercises, balance    Person(s) Educated Patient    Methods Explanation    Comprehension Verbalized understanding            PT Short Term Goals - 12/21/20 1127      PT SHORT TERM GOAL #1   Title Patient will  be independent in home exercise program to improve strength/mobility for better functional independence with ADLs.    Time 4    Period Weeks    Status New    Target Date 01/18/21      PT SHORT TERM GOAL #2   Title Patient will reduce timed up and go to <11 seconds to reduce fall risk and demonstrate improved transfer/gait ability.    Baseline 02/07: 11.93s    Time 4    Period Weeks    Status New    Target Date 01/18/21             PT Long Term Goals - 12/21/20 1130      PT LONG TERM GOAL #1   Title Patient will increase FOTO score to equal to or greater than to 67 demonstrate statistically significant improvement in mobility and quality of life.    Baseline 02/07: 60    Time 8    Period Weeks    Status New    Target Date 02/15/21      PT LONG TERM GOAL #2   Title Patient (> 80 years old) will complete five times sit to stand test in < 15 seconds indicating an increased LE strength and improved balance.    Baseline 02/07: 34.13s    Time 8    Period Weeks    Status New    Target Date 02/15/21      PT LONG TERM GOAL #3   Title Patient will increase Berg Balance score by > 6 points to demonstrate decreased fall risk during functional activities.    Baseline 02/07: 35/56    Time 8    Period Weeks    Status New    Target Date 02/15/21      PT LONG TERM GOAL #4   Title Patient will increase six minute walk test distance to >1000 for progression to community ambulator and improve gait ability    Baseline 02/07: 725 ft    Time 8    Period Weeks    Status New    Target Date 02/15/21      PT LONG TERM GOAL #5   Title Patient will increase 10 meter walk test to >1.43m/s as to improve gait speed for better community ambulation and to reduce fall risk.    Baseline 02/07: 0.84 m/s    Time 8    Period Weeks    Status New    Target Date 02/15/21  Plan - 01/08/21 1110    Clinical Impression Statement The patient presents with decreased balance  overall with intermittent need for UE support and heavy ankle strategy to maintain balance.  The patient with muscle fatigue with strengthening exercises.  The patient continues to benefit from additional skilled PT services to improve LE strength and balance for improved quality of life.    Personal Factors and Comorbidities Age;Comorbidity 1    Comorbidities L rotator cuff tear    Examination-Activity Limitations Bathing;Reach Overhead;Lift;Squat    Examination-Participation Restrictions Cleaning;Driving;Meal Prep    Stability/Clinical Decision Making Stable/Uncomplicated    Rehab Potential Fair    PT Frequency 2x / week    PT Duration 8 weeks    PT Treatment/Interventions Aquatic Therapy;Moist Heat;Gait training;Stair training;Functional mobility training;Therapeutic activities;Therapeutic exercise;Balance training;Neuromuscular re-education;Patient/family education;Manual lymph drainage;Passive range of motion;Dry needling    PT Next Visit Plan Initiate strengthening and balance exercises; increase resistance of strength exercises    Consulted and Agree with Plan of Care Patient;Family member/caregiver    Family Member Consulted aide, Izora Gala           Patient will benefit from skilled therapeutic intervention in order to improve the following deficits and impairments:  Abnormal gait,Decreased activity tolerance,Decreased endurance,Decreased strength,Decreased balance,Decreased mobility,Difficulty walking  Visit Diagnosis: Muscle weakness (generalized)  Unsteadiness on feet  Gait abnormality  Imbalance     Problem List Patient Active Problem List   Diagnosis Date Noted  . Left rotator cuff tear 02/08/2018    Hal Morales PT, DPT 01/08/2021, 11:14 AM  [place] MAIN [facility] SERVICES [address] [place], [place], [address] Phone: [phone]   Fax:  [phone]  Name: Sahvannah Rieser MRN: [id number] Date of Birth: May 20, 1951

## 2021-01-11 ENCOUNTER — Other Ambulatory Visit: Payer: Self-pay

## 2021-01-11 ENCOUNTER — Ambulatory Visit: Payer: Medicare Other

## 2021-01-11 DIAGNOSIS — M6281 Muscle weakness (generalized): Secondary | ICD-10-CM

## 2021-01-11 DIAGNOSIS — R2689 Other abnormalities of gait and mobility: Secondary | ICD-10-CM

## 2021-01-11 DIAGNOSIS — M25612 Stiffness of left shoulder, not elsewhere classified: Secondary | ICD-10-CM

## 2021-01-11 DIAGNOSIS — M2560 Stiffness of unspecified joint, not elsewhere classified: Secondary | ICD-10-CM

## 2021-01-11 DIAGNOSIS — R2681 Unsteadiness on feet: Secondary | ICD-10-CM

## 2021-01-11 DIAGNOSIS — R269 Unspecified abnormalities of gait and mobility: Secondary | ICD-10-CM

## 2021-01-11 NOTE — Therapy (Signed)
[place] MAIN [facility] SERVICES [address] [place], [place], [address] Phone: [phone]   Fax:  [phone]  Physical Therapy Treatment  Patient Details  Name: Shannon Donovan MRN: [id number] Date of Birth: Jun 04, 1951 Referring Provider (PT): Jennings Books   Encounter Date: 01/11/2021   PT End of Session - 01/11/21 1007    Visit Number 7    Number of Visits 17    Date for PT Re-Evaluation 02/15/21    Authorization Type eval: 02/07    PT Start Time 1015    PT Stop Time 1057    PT Time Calculation (min) 42 min    Equipment Utilized During Treatment Gait belt    Activity Tolerance Patient tolerated treatment well    Behavior During Therapy [facility] for tasks assessed/performed           Past Medical History:  Diagnosis Date  . Adolescent scoliosis   . Anxiety   . Atrial fibrillation ([place])   . Dysrhythmia    A fib  . History of chicken pox   . Hyperlipidemia   . Hyperplastic colon polyp 09/05/2016   Tubular adenoma of colon  . Hypertension    benign/ controlled  . Short-term memory loss   . Shoulder tendinitis    LEFT upper biceps tendonitis, rotator cuff tendinitis  . Stroke ([place]) 62/8315   embolic CVA/ short term memory loss    Past Surgical History:  Procedure Laterality Date  . BACK SURGERY     x2 at age 89 and 67 for scoliosis  . COLONOSCOPY N/A 09/05/2016   Procedure: COLONOSCOPY;  Surgeon: Lollie Sails, MD;  Location: [facility] ENDOSCOPY;  Service: Endoscopy;  Laterality: N/A;  Pt on Coumadin (PT/ INR)  . SHOULDER ARTHROSCOPY WITH OPEN ROTATOR CUFF REPAIR Left 02/08/2018   Procedure: SHOULDER ARTHROSCOPY WITH OPEN ROTATOR CUFF REPAIR;  Surgeon: Corky Mull, MD;  Location: [facility] ORS;  Service: Orthopedics;  Laterality: Left;  . SHOULDER CLOSED REDUCTION Left 05/09/2018   Procedure: CLOSED MANIPULATION SHOULDER  UNDER ANESTHESIA WITH STEROID INJECTION;  Surgeon: Corky Mull, MD;  Location: [place];  Service:  Orthopedics;  Laterality: Left;  . TONSILLECTOMY      There were no vitals filed for this visit.  "Lelan Pons"  Subjective Assessment - 01/11/21 1019    Subjective The patient reports that she has not had any stumbles and is doing well.    Patient is accompained by: --   CNA, Izora Gala   Pertinent History 70 y.o. female presents to skilled physical therapy with a recent referral of imbalance. 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 She states she is very cautious and slow when she is ambulating in the community. She does poorly with ambulating in areas with a combination of high ceilings and big crowds such as the mall. She reports of having 1 fall in the house in the past 6 months. She states she was getting up from a surface and slipped and when she tried to catch the fall then her foot turned wrong.    Limitations Lifting;Standing;Walking    How long can you sit comfortably? unlimited    How long can you stand comfortably? 30-45  mins    How long can you walk comfortably? 45-60 mins    Currently in Pain? No/denies    Pain Onset More than a month ago           Nu Step x L1 x  6 mins with alternating BLE and RUE; VC to increase SPM to 52     2.5# ankle weights:  (no weights today) Seated LAQ  3" 2x15 BLE  Seated marches 2x15 TC/VC for technique   Seated GTB hip abduction 2x15 Seated GTB hamstring curls 2x15    Standing marches - x20 with UE support Heel raises with BUE support x20 reps    Airex WBOS EO/EC 30"x1 each  Airex NBOS EO/EC 30"x1 each, lateral sway  BOSU lunges forward x10 each light UE support Airex Semi tandem balance 30"x1 each  Airex beam forward/backward walking x4 laps Airex beam side stepping x4 laps    step downs nv                         PT Education - 01/11/21 1023    Education Details balance, endurance    Person(s) Educated Patient    Methods Explanation    Comprehension Verbalized understanding             PT Short Term Goals - 12/21/20 1127      PT SHORT TERM GOAL #1   Title Patient will be independent in home exercise program to improve strength/mobility for better functional independence with ADLs.    Time 4    Period Weeks    Status New    Target Date 01/18/21      PT SHORT TERM GOAL #2   Title Patient will reduce timed up and go to <11 seconds to reduce fall risk and demonstrate improved transfer/gait ability.    Baseline 02/07: 11.93s    Time 4    Period Weeks    Status New    Target Date 01/18/21             PT Long Term Goals - 12/21/20 1130      PT LONG TERM GOAL #1   Title Patient will increase FOTO score to equal to or greater than to 67 demonstrate statistically significant improvement in mobility and quality of life.    Baseline 02/07: 60    Time 8    Period Weeks    Status New    Target Date 02/15/21      PT LONG TERM GOAL #2   Title Patient (> 48 years old) will complete five times sit to stand test in < 15 seconds indicating an increased LE strength and improved balance.    Baseline 02/07: 34.13s    Time 8    Period Weeks    Status New    Target Date 02/15/21      PT LONG TERM GOAL #3   Title Patient will increase Berg Balance score by > 6 points to demonstrate decreased fall risk during functional activities.    Baseline 02/07: 35/56    Time 8    Period Weeks    Status New    Target Date 02/15/21      PT LONG TERM GOAL #4   Title Patient will increase six minute walk test distance to >1000 for progression to community ambulator and improve gait ability    Baseline 02/07: 725 ft    Time 8    Period Weeks    Status New    Target Date 02/15/21      PT LONG TERM GOAL #5   Title Patient will increase 10 meter walk test to >1.62m/s as to improve gait speed for better community ambulation and to reduce fall risk.  Baseline 02/07: 0.84 m/s    Time 8    Period Weeks    Status New    Target Date 02/15/21                 Plan -  01/11/21 1026    Clinical Impression Statement The patient presents with improved stability on uneven surfaces with less forward lean and increase use of ankle strategies to maintain balance.  The patient continues to benefit from additional skilled PT services to improve LE strength and balance for improved quality of life.    Personal Factors and Comorbidities Age;Comorbidity 1    Comorbidities L rotator cuff tear    Examination-Activity Limitations Bathing;Reach Overhead;Lift;Squat    Examination-Participation Restrictions Cleaning;Driving;Meal Prep    Stability/Clinical Decision Making Stable/Uncomplicated    Rehab Potential Fair    PT Frequency 2x / week    PT Duration 8 weeks    PT Treatment/Interventions Aquatic Therapy;Moist Heat;Gait training;Stair training;Functional mobility training;Therapeutic activities;Therapeutic exercise;Balance training;Neuromuscular re-education;Patient/family education;Manual lymph drainage;Passive range of motion;Dry needling    PT Next Visit Plan Initiate strengthening and balance exercises; increase resistance of strength exercises    Consulted and Agree with Plan of Care Patient;Family member/caregiver    Family Member Consulted aide, Izora Gala           Patient will benefit from skilled therapeutic intervention in order to improve the following deficits and impairments:  Abnormal gait,Decreased activity tolerance,Decreased endurance,Decreased strength,Decreased balance,Decreased mobility,Difficulty walking  Visit Diagnosis: Muscle weakness (generalized)  Unsteadiness on feet  Gait abnormality  Imbalance  Limited joint range of motion (ROM)  Stiffness of left shoulder, not elsewhere classified     Problem List Patient Active Problem List   Diagnosis Date Noted  . Left rotator cuff tear 02/08/2018    Hal Morales PT, DPT 01/11/2021, 11:55 AM  [place] MAIN [facility] SERVICES [address]  [place], [place], [address] Phone: [phone]   Fax:  [phone]  Name: Shannon Donovan MRN: [id number] Date of Birth: 1950-11-25

## 2021-01-12 ENCOUNTER — Ambulatory Visit: Payer: Medicare Other

## 2021-01-13 ENCOUNTER — Ambulatory Visit: Payer: Medicare Other

## 2021-01-15 ENCOUNTER — Ambulatory Visit: Payer: Medicare Other | Attending: Neurology

## 2021-01-15 ENCOUNTER — Other Ambulatory Visit: Payer: Self-pay

## 2021-01-15 DIAGNOSIS — R2689 Other abnormalities of gait and mobility: Secondary | ICD-10-CM | POA: Insufficient documentation

## 2021-01-15 DIAGNOSIS — M25612 Stiffness of left shoulder, not elsewhere classified: Secondary | ICD-10-CM | POA: Diagnosis present

## 2021-01-15 DIAGNOSIS — R269 Unspecified abnormalities of gait and mobility: Secondary | ICD-10-CM | POA: Insufficient documentation

## 2021-01-15 DIAGNOSIS — M6281 Muscle weakness (generalized): Secondary | ICD-10-CM | POA: Insufficient documentation

## 2021-01-15 DIAGNOSIS — M2560 Stiffness of unspecified joint, not elsewhere classified: Secondary | ICD-10-CM | POA: Diagnosis present

## 2021-01-15 DIAGNOSIS — R2681 Unsteadiness on feet: Secondary | ICD-10-CM | POA: Diagnosis present

## 2021-01-15 NOTE — Therapy (Signed)
[place] MAIN [facility] SERVICES [address] [place], [place], [address] Phone: [phone]   Fax:  [phone]  Physical Therapy Treatment  Patient Details  Name: Shannon Donovan MRN: [id number] Date of Birth: 1951/06/18 Referring Provider (PT): Jennings Books   Encounter Date: 01/15/2021   PT End of Session - 01/15/21 1016    Visit Number 8    Number of Visits 17    Date for PT Re-Evaluation 02/15/21    Authorization Type eval: 02/07    PT Start Time 0930    PT Stop Time 1011    PT Time Calculation (min) 41 min    Equipment Utilized During Treatment Gait belt    Activity Tolerance Patient tolerated treatment well    Behavior During Therapy [facility] for tasks assessed/performed           Past Medical History:  Diagnosis Date  . Adolescent scoliosis   . Anxiety   . Atrial fibrillation ([place])   . Dysrhythmia    A fib  . History of chicken pox   . Hyperlipidemia   . Hyperplastic colon polyp 09/05/2016   Tubular adenoma of colon  . Hypertension    benign/ controlled  . Short-term memory loss   . Shoulder tendinitis    LEFT upper biceps tendonitis, rotator cuff tendinitis  . Stroke ([place]) 93/8182   embolic CVA/ short term memory loss    Past Surgical History:  Procedure Laterality Date  . BACK SURGERY     x2 at age 66 and 56 for scoliosis  . COLONOSCOPY N/A 09/05/2016   Procedure: COLONOSCOPY;  Surgeon: Lollie Sails, MD;  Location: [facility] ENDOSCOPY;  Service: Endoscopy;  Laterality: N/A;  Pt on Coumadin (PT/ INR)  . SHOULDER ARTHROSCOPY WITH OPEN ROTATOR CUFF REPAIR Left 02/08/2018   Procedure: SHOULDER ARTHROSCOPY WITH OPEN ROTATOR CUFF REPAIR;  Surgeon: Corky Mull, MD;  Location: [facility] ORS;  Service: Orthopedics;  Laterality: Left;  . SHOULDER CLOSED REDUCTION Left 05/09/2018   Procedure: CLOSED MANIPULATION SHOULDER  UNDER ANESTHESIA WITH STEROID INJECTION;  Surgeon: Corky Mull, MD;  Location: [place];  Service:  Orthopedics;  Laterality: Left;  . TONSILLECTOMY      There were no vitals filed for this visit.   Subjective Assessment - 01/15/21 0937    Subjective The patient reports that her strength and balance is doing well.    Patient is accompained by: --   CNA, Izora Gala   Pertinent History 70 y.o. female presents to skilled physical therapy with a recent referral of imbalance. 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 She states she is very cautious and slow when she is ambulating in the community. She does poorly with ambulating in areas with a combination of high ceilings and big crowds such as the mall. She reports of having 1 fall in the house in the past 6 months. She states she was getting up from a surface and slipped and when she tried to catch the fall then her foot turned wrong.    Limitations Lifting;Standing;Walking    How long can you sit comfortably? unlimited    How long can you stand comfortably? 30-45  mins    How long can you walk comfortably? 45-60 mins    Currently in Pain? No/denies    Pain Score 0-No pain    Pain Onset More than a month ago           Nu Step  x L1 x 6 mins; VC to increase SPM to >60   [place] Balance Tux Racer 3 trials- improved ability to hit target in 1-2 trial decreased posterior weight shift  Step downs from stepper step x8 to simulate stepping down curbs.  Pt stepping down with left LE, verbal cueing to not use UE support.  Had patient hold cone in each hand to avoid grabbing onto parallel bars  updated 6 min walk test                         PT Education - 01/15/21 0938    Education Details balance, exercises    Person(s) Educated Patient    Methods Explanation    Comprehension Verbalized understanding            PT Short Term Goals - 12/21/20 1127      PT SHORT TERM GOAL #1   Title Patient will be independent in home exercise program to improve strength/mobility for better functional  independence with ADLs.    Time 4    Period Weeks    Status New    Target Date 01/18/21      PT SHORT TERM GOAL #2   Title Patient will reduce timed up and go to <11 seconds to reduce fall risk and demonstrate improved transfer/gait ability.    Baseline 02/07: 11.93s    Time 4    Period Weeks    Status New    Target Date 01/18/21             PT Long Term Goals - 01/15/21 0954      PT LONG TERM GOAL #1   Title Patient will increase FOTO score to equal to or greater than to 67 demonstrate statistically significant improvement in mobility and quality of life.    Baseline 02/07: 60    Time 8    Period Weeks    Status New      PT LONG TERM GOAL #2   Title Patient (> 89 years old) will complete five times sit to stand test in < 15 seconds indicating an increased LE strength and improved balance.    Baseline 02/07: 34.13s    Time 8    Period Weeks    Status New      PT LONG TERM GOAL #3   Title Patient will increase Berg Balance score by > 6 points to demonstrate decreased fall risk during functional activities.    Baseline 02/07: 35/56    Time 8    Period Weeks    Status New      PT LONG TERM GOAL #4   Title Patient will increase six minute walk test distance to >1000 for progression to community ambulator and improve gait ability    Baseline 02/07: 725 ft, 01/15/21  1020 ft    Time 8    Period Weeks    Status Achieved      PT LONG TERM GOAL #5   Title Patient will increase 10 meter walk test to >1.19m/s as to improve gait speed for better community ambulation and to reduce fall risk.    Baseline 02/07: 0.84 m/s    Time 8    Period Weeks    Status New                 Plan - 01/15/21 1102    Clinical Impression Statement The patient challenged with step downs from a 3.5 inch step with  fear to descend step without UE support despite having adequate strength.  The patient was abto to perform 3.5 inch step down x8 reps with verbal cueing and encouragement.   Patient demonstrated good stability performing exercise.  The patient increased 6 min walk test from 725 feet at initial eval to 1020 ft today.    Personal Factors and Comorbidities Age;Comorbidity 1    Comorbidities L rotator cuff tear    Examination-Activity Limitations Bathing;Reach Overhead;Lift;Squat    Examination-Participation Restrictions Cleaning;Driving;Meal Prep    Stability/Clinical Decision Making Stable/Uncomplicated    Rehab Potential Fair    PT Frequency 2x / week    PT Duration 8 weeks    PT Treatment/Interventions Aquatic Therapy;Moist Heat;Gait training;Stair training;Functional mobility training;Therapeutic activities;Therapeutic exercise;Balance training;Neuromuscular re-education;Patient/family education;Manual lymph drainage;Passive range of motion;Dry needling    PT Next Visit Plan Initiate strengthening and balance exercises; increase resistance of strength exercises    Consulted and Agree with Plan of Care Patient;Family member/caregiver           Patient will benefit from skilled therapeutic intervention in order to improve the following deficits and impairments:  Abnormal gait,Decreased activity tolerance,Decreased endurance,Decreased strength,Decreased balance,Decreased mobility,Difficulty walking  Visit Diagnosis: Muscle weakness (generalized)  Unsteadiness on feet  Gait abnormality  Imbalance  Limited joint range of motion (ROM)  Stiffness of left shoulder, not elsewhere classified     Problem List Patient Active Problem List   Diagnosis Date Noted  . Left rotator cuff tear 02/08/2018    Hal Morales PT, DPT 01/15/2021, 11:06 AM  [place] MAIN [facility] SERVICES [address] [place], [place], [address] Phone: [phone]   Fax:  [phone]  Name: Shannon Donovan MRN: [id number] Date of Birth: Jan 08, 1951

## 2021-01-18 ENCOUNTER — Other Ambulatory Visit: Payer: Self-pay

## 2021-01-18 ENCOUNTER — Ambulatory Visit: Payer: Medicare Other

## 2021-01-18 DIAGNOSIS — M6281 Muscle weakness (generalized): Secondary | ICD-10-CM | POA: Diagnosis not present

## 2021-01-18 DIAGNOSIS — R2689 Other abnormalities of gait and mobility: Secondary | ICD-10-CM

## 2021-01-18 DIAGNOSIS — M2560 Stiffness of unspecified joint, not elsewhere classified: Secondary | ICD-10-CM

## 2021-01-18 DIAGNOSIS — R269 Unspecified abnormalities of gait and mobility: Secondary | ICD-10-CM

## 2021-01-18 DIAGNOSIS — R2681 Unsteadiness on feet: Secondary | ICD-10-CM

## 2021-01-18 DIAGNOSIS — M25612 Stiffness of left shoulder, not elsewhere classified: Secondary | ICD-10-CM

## 2021-01-18 NOTE — Therapy (Signed)
[place] MAIN [facility] SERVICES [address] [place], [place], [address] Phone: [phone]   Fax:  [phone]  Physical Therapy Treatment  Patient Details  Name: Shannon Donovan MRN: [id number] Date of Birth: 02-Nov-1951 Referring Provider (PT): Jennings Books   Encounter Date: 01/18/2021   PT End of Session - 01/18/21 0922    Visit Number 9    Number of Visits 17    Date for PT Re-Evaluation 02/15/21    Authorization Type Medicare    Authorization Time Period 12/21/20-02/15/21    PT Start Time 0916    PT Stop Time 0956    PT Time Calculation (min) 40 min    Equipment Utilized During Treatment Gait belt    Activity Tolerance Patient tolerated treatment well;No increased pain    Behavior During Therapy WFL for tasks assessed/performed           Past Medical History:  Diagnosis Date  . Adolescent scoliosis   . Anxiety   . Atrial fibrillation ([place])   . Dysrhythmia    A fib  . History of chicken pox   . Hyperlipidemia   . Hyperplastic colon polyp 09/05/2016   Tubular adenoma of colon  . Hypertension    benign/ controlled  . Short-term memory loss   . Shoulder tendinitis    LEFT upper biceps tendonitis, rotator cuff tendinitis  . 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 BACK SURGERY     x2 at age 45 and 30 for scoliosis  . COLONOSCOPY N/A 09/05/2016   Procedure: COLONOSCOPY;  Surgeon: Lollie Sails, MD;  Location[facility] END[facility]COPY;  Service: Endoscopy;  Laterality: N/A;  Pt on Coumadin (PT/ INR)  . SHOULDER ARTHROSCOPY WITH OPEN ROTATOR CUFF REPAIR Left 02/08/2018   Procedure: SHOULDER ARTHROSCOPY WITH OPEN ROTATOR CUFF REPAIR;  Surgeon: Corky Mull, MD;  Location: [facility] ORS;  Service: Orthopedics;  Laterality: Left;  . SHOULDER CLOSED REDUCTION Left 05/09/2018   Procedure: CLOSED MANIPULATION SHOULDER  UNDER ANESTHESIA WITH STEROID INJECTION;  Surgeon:  Corky Mull, MD;  Location: [facility];  Service: Orthopedics;  Laterality: Left;  . TONSILLECTOMY      There were no vitals filed for this visit.   Subjective Assessment - 01/18/21 0920    Subjective Pt doing well today. Pain in Left shoulder unchanged. Pt denies any medical or medication updates since prior PT visit. Pt still working on HEP at home.    Patient is accompained by: --   CNA Izora Gala   Pertinent History 70 y.o. female presents to skilled physical therapy with a recent referral of imbalance. 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 She states she is very cautious and slow when she is ambulating in the community. She does poorly with ambulating in areas with a combination of high ceilings and big crowds such as the mall. She reports of having 1 fall in the house in the past 6 months. She states she was getting up from a surface and slipped and when she tried to catch the fall then her foot turned wrong.    Currently in Pain? Yes    Pain Score 3     Pain Radiating Towards Left shoulder           INTERVENTION THIS DATE:  -Nu Step x L1 x 6 mins; VC to increase SPM to >60   -  self selected stance on airex pad eyes closed 6x30sec (2 sets of 3 with seated rest break)  *confidence is somewhat limited, fatigue in ankles limits sustained performance -airex head turns (self selected stance) 1x10 bilat, 1x15 vertical  -BOSU lunges forward x10 each RUE support (requires extensive cues for correct form/movement) -BOSU FWD step-ups x10 each RUE support  -Airex beam side stepping x4 laps      PT Short Term Goals - 12/21/20 1127      PT SHORT TERM GOAL #1   Title Patient will be independent in home exercise program to improve strength/mobility for better functional independence with ADLs.    Time 4    Period Weeks    Status New    Target Date 01/18/21      PT SHORT TERM GOAL #2   Title Patient will reduce timed up and go to <11 seconds to reduce  fall risk and demonstrate improved transfer/gait ability.    Baseline 02/07: 11.93s    Time 4    Period Weeks    Status New    Target Date 01/18/21             PT Long Term Goals - 01/15/21 0954      PT LONG TERM GOAL #1   Title Patient will increase FOTO score to equal to or greater than to 67 demonstrate statistically significant improvement in mobility and quality of life.    Baseline 02/07: 60    Time 8    Period Weeks    Status New      PT LONG TERM GOAL #2   Title Patient (> 18 years old) will complete five times sit to stand test in < 15 seconds indicating an increased LE strength and improved balance.    Baseline 02/07: 34.13s    Time 8    Period Weeks    Status New      PT LONG TERM GOAL #3   Title Patient will increase Berg Balance score by > 6 points to demonstrate decreased fall risk during functional activities.    Baseline 02/07: 35/56    Time 8    Period Weeks    Status New      PT LONG TERM GOAL #4   Title Patient will increase six minute walk test distance to >1000 for progression to community ambulator and improve gait ability    Baseline 02/07: 725 ft, 01/15/21  1020 ft    Time 8    Period Weeks    Status Achieved      PT LONG TERM GOAL #5   Title Patient will increase 10 meter walk test to >1.78m/s as to improve gait speed for better community ambulation and to reduce fall risk.    Baseline 02/07: 0.84 m/s    Time 8    Period Weeks    Status New                 Plan - 01/18/21 5176    Clinical Impression Statement Continued with current plan of care as laid out in evaluation and recent prior sessions. Pt remains motivated to advance progress toward goals. Rest breaks provided as needed, pt quick to ask when needed. Pt does require varying levels of assistance and cuing for completion of exercises for correct form and sometimes due to pain/weakness. Pt closely monitored throughout session for safe vitals response and to maximize patient  safety during interventions. Pt continues to demonstrate progress toward goals AEB progression of  some interventions this date either in volume or intensity.   Personal Factors and Comorbidities Age    Comorbidities L rotator cuff tear    Examination-Activity Limitations Bathing;Reach Overhead;Lift;Squat    Examination-Participation Restrictions Cleaning    Stability/Clinical Decision Making Stable/Uncomplicated    Clinical Decision Making Moderate    Rehab Potential Fair    PT Frequency 2x / week    PT Duration 8 weeks    PT Treatment/Interventions Aquatic Therapy;Moist Heat;Gait training;Stair training;Functional mobility training;Therapeutic activities;Therapeutic exercise;Balance training;Neuromuscular re-education;Patient/family education;Manual lymph drainage;Passive range of motion;Dry needling    PT Next Visit Plan Continue with POC, no updates at present    PT Home Exercise Plan No updates this session    Consulted and Agree with Plan of Care Patient;Family member/caregiver           Patient will benefit from skilled therapeutic intervention in order to improve the following deficits and impairments:  Abnormal gait,Decreased activity tolerance,Decreased endurance,Decreased strength,Decreased balance,Decreased mobility,Difficulty walking  Visit Diagnosis: Muscle weakness (generalized)  Unsteadiness on feet  Gait abnormality  Imbalance  Limited joint range of motion (ROM)  Stiffness of left shoulder, not elsewhere classified     Problem List Patient Active Problem List   Diagnosis Date Noted  . Left rotator cuff tear 02/08/2018   9:52 AM, 01/18/21 Etta Grandchild, PT, DPT Physical Therapist - [place] [phone]      Etta Grandchild 01/18/2021, 9:25 AM  [place] MAIN [facility] SERVICES [address] [place], [place], [address] Phone:  [phone]   Fax:  [phone]  Name: Shannon Donovan MRN: [id number] Date of Birth: 1951-03-04

## 2021-01-20 ENCOUNTER — Other Ambulatory Visit: Payer: Self-pay

## 2021-01-20 ENCOUNTER — Ambulatory Visit: Payer: Medicare Other

## 2021-01-20 DIAGNOSIS — R269 Unspecified abnormalities of gait and mobility: Secondary | ICD-10-CM

## 2021-01-20 DIAGNOSIS — M6281 Muscle weakness (generalized): Secondary | ICD-10-CM

## 2021-01-20 DIAGNOSIS — R2681 Unsteadiness on feet: Secondary | ICD-10-CM

## 2021-01-20 NOTE — Therapy (Signed)
[place] MAIN [facility] SERVICES [address] [place], [place], [address] Phone: [phone]   Fax:  [phone]  Physical Therapy Treatment Physical Therapy Progress Note   Dates of reporting period  12/21/2020   to   01/20/2021   Patient Details  Name: Shannon Donovan MRN: [id number] Date of Birth: Sep 24, 1951 Referring Provider (PT): Jennings Books   Encounter Date: 01/20/2021   PT End of Session - 01/20/21 1709    Visit Number 10    Number of Visits 17    Date for PT Re-Evaluation 02/15/21    Authorization Type Medicare    Authorization Time Period 12/21/20-02/15/21    PT Start Time 1012    PT Stop Time 1100    PT Time Calculation (min) 48 min    Equipment Utilized During Treatment Gait belt    Activity Tolerance Patient tolerated treatment well    Behavior During Therapy [facility] for tasks assessed/performed           Past Medical History:  Diagnosis Date  . Adolescent scoliosis   . Anxiety   . Atrial fibrillation ([place])   . Dysrhythmia    A fib  . History of chicken pox   . Hyperlipidemia   . Hyperplastic colon polyp 09/05/2016   Tubular adenoma of colon  . Hypertension    benign/ controlled  . Short-term memory loss   . Shoulder tendinitis    LEFT upper biceps tendonitis, rotator cuff tendinitis  . Stroke ([place]) 01/5008   embolic CVA/ short term memory loss    Past Surgical History:  Procedure Laterality Date  . BACK SURGERY     x2 at age 28 and 66 for scoliosis  . COLONOSCOPY N/A 09/05/2016   Procedure: COLONOSCOPY;  Surgeon: Lollie Sails, MD;  Location: [facility] ENDOSCOPY;  Service: Endoscopy;  Laterality: N/A;  Pt on Coumadin (PT/ INR)  . SHOULDER ARTHROSCOPY WITH OPEN ROTATOR CUFF REPAIR Left 02/08/2018   Procedure: SHOULDER ARTHROSCOPY WITH OPEN ROTATOR CUFF REPAIR;  Surgeon: Corky Mull, MD;  Location: [facility] ORS;  Service: Orthopedics;  Laterality: Left;  . SHOULDER CLOSED REDUCTION Left 05/09/2018   Procedure: CLOSED  MANIPULATION SHOULDER  UNDER ANESTHESIA WITH STEROID INJECTION;  Surgeon: Corky Mull, MD;  Location: [place];  Service: Orthopedics;  Laterality: Left;  . TONSILLECTOMY      There were no vitals filed for this visit.   Subjective Assessment - 01/20/21 1007    Subjective Pt reports 1/10 pain in L shoulder today (not new pain). Pt reports walking at home for exercise.    Patient is accompained by: --   CNA Izora Gala   Pertinent History 70 y.o. female presents to skilled physical therapy with a recent referral of imbalance. 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 She states she is very cautious and slow when she is ambulating in the community. She does poorly with ambulating in areas with a combination of high ceilings and big crowds such as the mall. She reports of having 1 fall in the house in the past 6 months. She states she was getting up from a surface and slipped and when she tried to catch the fall then her foot turned wrong.    Currently in Pain? Yes    Pain Score 1     Pain Location Shoulder    Pain Orientation Left          TREATMENT  FOTO 49 (decrease)  5xsts:  13.35 sec (achieved)  10 meter walk test: 1.08 m/s (achieved)  DGI: 18/24 (new)  TUG 11.81 sec (slight improvement, virtually the same)  BERG 50/56 (achieved)  Updated HEP:  Access Code: [email or web address] URL: [email or web address] Date: 01/20/2021 Prepared by: Ricard Dillon  Exercises Seated March - 1 x daily - 4 x weekly - 3 sets - 10 reps Supine Bridge - 1 x daily - 4 x weekly - 3 sets - 10 reps Single Leg Stance with Support - 1 x daily - 7 x weekly - 2 sets - 2 reps - 30 hold    [facility] PT Assessment - 01/20/21 1028      Berg Balance Test   Sit to Stand Able to stand without using hands and stabilize independently    Standing Unsupported Able to stand safely 2 minutes    Sitting with Back Unsupported but Feet Supported on Floor or Stool Able to sit safely and  securely 2 minutes    Stand to Sit Sits safely with minimal use of hands    Transfers Able to transfer safely, minor use of hands    Standing Unsupported with Eyes Closed Able to stand 10 seconds safely    Standing Unsupported with Feet Together Able to place feet together independently and stand 1 minute safely    From Standing, Reach Forward with Outstretched Arm Can reach confidently >25 cm (10")    From Standing Position, Pick up Object from Floor Able to pick up shoe, needs supervision    From Standing Position, Turn to Look Behind Over each Shoulder Looks behind from both sides and weight shifts well    Turn 360 Degrees Able to turn 360 degrees safely in 4 seconds or less    Standing Unsupported, Alternately Place Feet on Step/Stool Able to stand independently and safely and complete 8 steps in 20 seconds    Standing Unsupported, One Foot in Front Able to take small step independently and hold 30 seconds    Standing on One Leg Tries to lift leg/unable to hold 3 seconds but remains standing independently    Total Score 50         Assessment: Goals reassessed today and pt provided with updated HEP, as pt not currently independent with HEP. Pt met three of her goals today: BERG 50/56, 5xSTS 13.35 seconds, and 10MWT 1.08 m/s. These findings indicate pt has improved BLE power, improved her gait speed and decreased her fall risk in regard to static balance tasks. Although she demonstrates progress, pt DGI tested today, where her score was 18/24, indicating decreased dynamic balance and increased fall risk with such tasks. Pt FOTO score also decreased at assessment today (score 49). Patient's condition has the potential to improve in response to therapy. Maximum improvement is yet to be obtained. The anticipated improvement is attainable and reasonable in a generally predictable time. Pt will continue to benefit from further skilled therapy to improve balance and overall functional mobility in order  to decrease fall risk and improve QOL.      PT Short Term Goals - 01/20/21 1016      PT SHORT TERM GOAL #1   Title Patient will be independent in home exercise program to improve strength/mobility for better functional independence with ADLs.    Baseline 01/20/2021 Pt currently walking at home for exercise. Not fully indep with HEP (issued new handout).    Time 4    Period Weeks    Status On-going    Target Date 02/17/21  PT SHORT TERM GOAL #2   Title Patient will reduce timed up and go to <11 seconds to reduce fall risk and demonstrate improved transfer/gait ability.    Baseline 02/07: 11.93s; 01/20/2021 11.81 seconds    Time 4    Period Weeks    Status Partially Met    Target Date 02/17/21             PT Long Term Goals - 01/20/21 1022      PT LONG TERM GOAL #1   Title Patient will increase FOTO score to equal to or greater than to 67 demonstrate statistically significant improvement in mobility and quality of life.    Baseline 02/07: 60; 01/20/2021 49    Time 8    Period Weeks    Status On-going    Target Date 02/15/21      PT LONG TERM GOAL #2   Title Patient (> 33 years old) will complete five times sit to stand test in < 15 seconds indicating an increased LE strength and improved balance.    Baseline 02/07: 34.13s; 01/20/2021 13.35 sec    Time 8    Period Weeks    Status Achieved    Target Date 02/15/21      PT LONG TERM GOAL #3   Title Patient will increase Berg Balance score by > 6 points to demonstrate decreased fall risk during functional activities.    Baseline 02/07: 35/56; 01/20/2021 50/56    Time 8    Period Weeks    Status Achieved    Target Date 02/15/21      PT LONG TERM GOAL #4   Title Patient will increase six minute walk test distance to >1000 for progression to community ambulator and improve gait ability    Baseline 02/07: 725 ft, 01/15/21  1020 ft    Time 8    Period Weeks    Status Achieved    Target Date 02/15/21      PT LONG TERM GOAL #5    Title Patient will increase 10 meter walk test to >1.35ms as to improve gait speed for better community ambulation and to reduce fall risk.    Baseline 02/07: 0.84 m/s; 01/20/2021 1.08 m/s close CGA    Time 8    Period Weeks    Status Achieved    Target Date 02/15/21      Additional Long Term Goals   Additional Long Term Goals Yes      PT LONG TERM GOAL #6   Title Patient will increase dynamic gait index score to >19/24 as to demonstrate reduced fall risk and improved dynamic gait balance for better safety with community/home ambulation.    Baseline 01/20/2021 18/24    Time 4    Period Weeks    Status New    Target Date 02/17/21                 Plan - 01/20/21 1711    Clinical Impression Statement Goals reassessed today and pt provided with updated HEP, as pt not currently independent with HEP. Pt met three of her goals today: BERG 50/56, 5xSTS 13.35 seconds, and 10MWT 1.08 m/s. These findings indicate pt has improved BLE power, improved her gait speed and decreased her fall risk in regard to static balance tasks. Although she demonstrates progress, pt DGI tested today, where her score was 18/24, indicating decreased dynamic balance and increased fall risk with such tasks. Pt FOTO score also decreased at assessment today (score  49). Patient's condition has the potential to improve in response to therapy. Maximum improvement is yet to be obtained. The anticipated improvement is attainable and reasonable in a generally predictable time. Pt will continue to benefit from further skilled therapy to improve balance and overall functional mobility in order to decrease fall risk and improve QOL.    Personal Factors and Comorbidities Age    Comorbidities L rotator cuff tear    Examination-Activity Limitations Bathing;Reach Overhead;Lift;Squat    Examination-Participation Restrictions Cleaning    Stability/Clinical Decision Making Stable/Uncomplicated    Rehab Potential Fair    PT Frequency  2x / week    PT Duration 8 weeks    PT Treatment/Interventions Aquatic Therapy;Moist Heat;Gait training;Stair training;Functional mobility training;Therapeutic activities;Therapeutic exercise;Balance training;Neuromuscular re-education;Patient/family education;Manual lymph drainage;Passive range of motion;Dry needling    PT Next Visit Plan Continue with POC, no updates at present; dynamic balance tasks    PT Home Exercise Plan No updates this session    Consulted and Agree with Plan of Care Patient;Family member/caregiver           Patient will benefit from skilled therapeutic intervention in order to improve the following deficits and impairments:  Abnormal gait,Decreased activity tolerance,Decreased endurance,Decreased strength,Decreased balance,Decreased mobility,Difficulty walking  Visit Diagnosis: Unsteadiness on feet  Gait abnormality  Muscle weakness (generalized)     Problem List Patient Active Problem List   Diagnosis Date Noted  . Left rotator cuff tear 02/08/2018   Ricard Dillon PT, DPT  01/20/2021, 5:24 PM  [place] MAIN [facility] SERVICES [address] [place], [place], [address] Phone: [phone]   Fax:  [phone]  Name: Shannon Donovan MRN: [id number] Date of Birth: [birth year]

## 2021-01-25 ENCOUNTER — Ambulatory Visit: Payer: Medicare Other

## 2021-01-25 DIAGNOSIS — R269 Unspecified abnormalities of gait and mobility: Secondary | ICD-10-CM

## 2021-01-25 DIAGNOSIS — R2681 Unsteadiness on feet: Secondary | ICD-10-CM

## 2021-01-25 DIAGNOSIS — M6281 Muscle weakness (generalized): Secondary | ICD-10-CM

## 2021-01-25 NOTE — Therapy (Signed)
[place] MAIN [facility] SERVICES [address] [place], [place], [address] Phone: [phone]   Fax:  [phone]  Physical Therapy Treatment  Patient Details  Name: Shannon Donovan MRN: [id number] Date of Birth: [birth year] Referring Provider (PT): Jennings Books   Encounter Date: 01/25/2021   PT End of Session - 01/25/21 1025    Visit Number 11    Number of Visits 17    Date for PT Re-Evaluation 02/15/21    Authorization Type Medicare    Authorization Time Period 12/21/20-02/15/21    PT Start Time 0850    PT Stop Time 0932    PT Time Calculation (min) 42 min    Equipment Utilized During Treatment Gait belt    Activity Tolerance Patient tolerated treatment well    Behavior During Therapy [facility] for tasks assessed/performed           Past Medical History:  Diagnosis Date  . Adolescent scoliosis   . Anxiety   . Atrial fibrillation ([place])   . Dysrhythmia    A fib  . History of chicken pox   . Hyperlipidemia   . Hyperplastic colon polyp 09/05/2016   Tubular adenoma of colon  . Hypertension    benign/ controlled  . Short-term memory loss   . Shoulder tendinitis    LEFT upper biceps tendonitis, rotator cuff tendinitis  . 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 BACK SURGERY     x2 at age 79 and 53 for scoliosis  . COLONOSCOPY N/A 09/05/2016   Procedure: COLONOSCOPY;  Surgeon: Lollie Sails, MD;  Location: [facility] ENDOSCOPY;  Service: Endoscopy;  Laterality: N/A;  Pt on Coumadin (PT/ INR)  . SHOULDER ARTHROSCOPY WITH OPEN ROTATOR CUFF REPAIR Left 02/08/2018   Procedure: SHOULDER ARTHROSCOPY WITH OPEN ROTATOR CUFF REPAIR;  Surgeon: Corky Mull, MD;  Location: [facility] ORS;  Service: Orthopedics;  Laterality: Left;  . SHOULDER CLOSED REDUCTION Left 05/09/2018   Procedure: CLOSED MANIPULATION SHOULDER  UNDER ANESTHESIA WITH STEROID INJECTION;  Surgeon: Corky Mull, MD;   Location: [place];  Service: Orthopedics;  Laterality: Left;  . TONSILLECTOMY      There were no vitals filed for this visit.   Subjective Assessment - 01/25/21 0854    Subjective Pt reports no pain today. No other changes noted.    Patient is accompained by: --   CNA Izora Gala   Pertinent History 70 y.o. female presents to skilled physical therapy with a recent referral of imbalance. 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 She states she is very cautious and slow when she is ambulating in the community. She does poorly with ambulating in areas with a combination of high ceilings and big crowds such as the mall. She reports of having 1 fall in the house in the past 6 months. She states she was getting up from a surface and slipped and when she tried to catch the fall then her foot turned wrong.    Currently in Pain? No/denies            INTERVENTION THIS DATE: CGA provided for all exercises  -Nu Step x L1 x 4 mins; multiple VC to increase SPM to >60 At 2 min had to decrease to level 0. Pt reports nustep is "hard today" Required brief rest break  -Stance on airex pad EO x30 sec  -Stance  on airex pad eyes closed 2x30 sec intermittent UE support  -Stance on airex pad NBOS EC 2x30 sec intermittent UE support  -Airex pad stance wit head turns 2x30 sec bilat, 2x30 sec vertical   -Yellow wobble pad lunges forward 2x10 each LE with 3 sec hold, intermittent UE support  (requires cues for correct form/movement)  Obstacle course through/around cones and with stepping onto foam and over objects 4x  Obstacle course through/around cones and with stepping onto foam and over objects with cone taps 4x  Cone taps with intermittent UE support 2x20  Alternating taps onto center of 6" step 10x each LE   Education provided through out via use of VC/TC and demonstration to facilitate movement at target joints and for correct muscle activation for  technique.  Assessment: Pt has difficulty with eccentric control for cone taps and for clearance of BLEs over 6" step. She requires frequent cues throughout for technique and to decrease speed of performing reps. Pt demonstrates hesitancy with most dynamic balance tasks. Pt will benefit from further skilled therapy to improve static and dynamic balance in order to improve safety with all activities and QOL.  PT Education - 01/25/21 1024    Education Details technique with foot clearance activities, body mechanics    Person(s) Educated Patient    Methods Explanation;Demonstration;Verbal cues    Comprehension Verbalized understanding;Returned demonstration            PT Short Term Goals - 01/20/21 1016      PT SHORT TERM GOAL #1   Title Patient will be independent in home exercise program to improve strength/mobility for better functional independence with ADLs.    Baseline 01/20/2021 Pt currently walking at home for exercise. Not fully indep with HEP (issued new handout).    Time 4    Period Weeks    Status On-going    Target Date 02/17/21      PT SHORT TERM GOAL #2   Title Patient will reduce timed up and go to <11 seconds to reduce fall risk and demonstrate improved transfer/gait ability.    Baseline 02/07: 11.93s; 01/20/2021 11.81 seconds    Time 4    Period Weeks    Status Partially Met    Target Date 02/17/21             PT Long Term Goals - 01/20/21 1022      PT LONG TERM GOAL #1   Title Patient will increase FOTO score to equal to or greater than to 67 demonstrate statistically significant improvement in mobility and quality of life.    Baseline 02/07: 60; 01/20/2021 49    Time 8    Period Weeks    Status On-going    Target Date 02/15/21      PT LONG TERM GOAL #2   Title Patient (> 7 years old) will complete five times sit to stand test in < 15 seconds indicating an increased LE strength and improved balance.    Baseline 02/07: 34.13s; 01/20/2021 13.35 sec    Time 8     Period Weeks    Status Achieved    Target Date 02/15/21      PT LONG TERM GOAL #3   Title Patient will increase Berg Balance score by > 6 points to demonstrate decreased fall risk during functional activities.    Baseline 02/07: 35/56; 01/20/2021 50/56    Time 8    Period Weeks    Status Achieved    Target Date 02/15/21  PT LONG TERM GOAL #4   Title Patient will increase six minute walk test distance to >1000 for progression to community ambulator and improve gait ability    Baseline 02/07: 725 ft, 01/15/21  1020 ft    Time 8    Period Weeks    Status Achieved    Target Date 02/15/21      PT LONG TERM GOAL #5   Title Patient will increase 10 meter walk test to >1.13ms as to improve gait speed for better community ambulation and to reduce fall risk.    Baseline 02/07: 0.84 m/s; 01/20/2021 1.08 m/s close CGA    Time 8    Period Weeks    Status Achieved    Target Date 02/15/21      Additional Long Term Goals   Additional Long Term Goals Yes      PT LONG TERM GOAL #6   Title Patient will increase dynamic gait index score to >19/24 as to demonstrate reduced fall risk and improved dynamic gait balance for better safety with community/home ambulation.    Baseline 01/20/2021 18/24    Time 4    Period Weeks    Status New    Target Date 02/17/21             Plan - 01/25/21 1026    Clinical Impression Statement Pt has difficulty with eccentric control for cone taps and for clearance of BLEs over 6" step. She requires frequent cues throughout for technique and to decrease speed of performing reps. Pt demonstrates hesitancy with most dynamic balance tasks. Pt will benefit from further skilled therapy to improve static and dynamic balance in order to improve safety with all activities and QOL.    Personal Factors and Comorbidities Age    Comorbidities L rotator cuff tear    Examination-Activity Limitations Bathing;Reach Overhead;Lift;Squat    Examination-Participation Restrictions  Cleaning    Stability/Clinical Decision Making Stable/Uncomplicated    Rehab Potential Fair    PT Frequency 2x / week    PT Duration 8 weeks    PT Treatment/Interventions Aquatic Therapy;Moist Heat;Gait training;Stair training;Functional mobility training;Therapeutic activities;Therapeutic exercise;Balance training;Neuromuscular re-education;Patient/family education;Manual lymph drainage;Passive range of motion;Dry needling    PT Next Visit Plan Continue with POC, no updates at present; dynamic balance tasks; SLB and cone taps    PT Home Exercise Plan No updates this session    Consulted and Agree with Plan of Care Patient;Family member/caregiver           Patient will benefit from skilled therapeutic intervention in order to improve the following deficits and impairments:  Abnormal gait,Decreased activity tolerance,Decreased endurance,Decreased strength,Decreased balance,Decreased mobility,Difficulty walking  Visit Diagnosis: Unsteadiness on feet  Gait abnormality  Muscle weakness (generalized)     Problem List Patient Active Problem List   Diagnosis Date Noted  . Left rotator cuff tear 02/08/2018   HRicard DillonPT, DPT 01/25/2021, 10:30 AM  CPaj[place]MAIN [facility] CenterSERVICES [address] [place] [address]Phone: [phone]  Fax:  [phone] Name: Shannon McgloinMRN: [id number] of Birth: [birth year]

## 2021-01-27 ENCOUNTER — Ambulatory Visit: Payer: Medicare Other

## 2021-01-27 ENCOUNTER — Other Ambulatory Visit: Payer: Self-pay

## 2021-01-27 ENCOUNTER — Encounter: Payer: Self-pay | Admitting: Physical Therapy

## 2021-01-27 DIAGNOSIS — R269 Unspecified abnormalities of gait and mobility: Secondary | ICD-10-CM

## 2021-01-27 DIAGNOSIS — M6281 Muscle weakness (generalized): Secondary | ICD-10-CM | POA: Diagnosis not present

## 2021-01-27 DIAGNOSIS — R2681 Unsteadiness on feet: Secondary | ICD-10-CM

## 2021-01-27 DIAGNOSIS — R2689 Other abnormalities of gait and mobility: Secondary | ICD-10-CM

## 2021-01-27 NOTE — Therapy (Signed)
[place] MAIN [facility] SERVICES [address] [place], [place], [address] Phone: [phone]   Fax:  [phone]  Physical Therapy Treatment  Patient Details  Name: Shannon Donovan MRN: [id number] Date of Birth: 10/13/1951 Referring Provider (PT): Jennings Books   Encounter Date: 01/27/2021   PT End of Session - 01/27/21 0851    Visit Number 12    Number of Visits 17    Date for PT Re-Evaluation 02/15/21    Authorization Type Medicare    Authorization Time Period 12/21/20-02/15/21    PT Start Time 0847    PT Stop Time 0928    PT Time Calculation (min) 41 min    Equipment Utilized During Treatment Gait belt    Activity Tolerance Patient tolerated treatment well    Behavior During Therapy [facility] for tasks assessed/performed           Past Medical History:  Diagnosis Date  . Adolescent scoliosis   . Anxiety   . Atrial fibrillation ([place])   . Dysrhythmia    A fib  . History of chicken pox   . Hyperlipidemia   . Hyperplastic colon polyp 09/05/2016   Tubular adenoma of colon  . Hypertension    benign/ controlled  . Short-term memory loss   . Shoulder tendinitis    LEFT upper biceps tendonitis, rotator cuff tendinitis  . Stroke ([place]) 96/7591   embolic CVA/ short term memory loss    Past Surgical History:  Procedure Laterality Date  . BACK SURGERY     x2 at age 80 and 9 for scoliosis  . COLONOSCOPY N/A 09/05/2016   Procedure: COLONOSCOPY;  Surgeon: Lollie Sails, MD;  Location: [facility] ENDOSCOPY;  Service: Endoscopy;  Laterality: N/A;  Pt on Coumadin (PT/ INR)  . SHOULDER ARTHROSCOPY WITH OPEN ROTATOR CUFF REPAIR Left 02/08/2018   Procedure: SHOULDER ARTHROSCOPY WITH OPEN ROTATOR CUFF REPAIR;  Surgeon: Corky Mull, MD;  Location: [facility] ORS;  Service: Orthopedics;  Laterality: Left;  . SHOULDER CLOSED REDUCTION Left 05/09/2018   Procedure: CLOSED MANIPULATION SHOULDER  UNDER ANESTHESIA WITH STEROID INJECTION;  Surgeon: Corky Mull, MD;   Location: [place];  Service: Orthopedics;  Laterality: Left;  . TONSILLECTOMY      There were no vitals filed for this visit.   Subjective Assessment - 01/27/21 0850    Subjective Pt states minor L shoulder pain at 1/10 NPS. No falls or stumbles or changes to medications at this time.    Patient is accompained by: --   CNA Izora Gala   Pertinent History 70 y.o. female presents to skilled physical therapy with a recent referral of imbalance. 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 She states she is very cautious and slow when she is ambulating in the community. She does poorly with ambulating in areas with a combination of high ceilings and big crowds such as the mall. She reports of having 1 fall in the house in the past 6 months. She states she was getting up from a surface and slipped and when she tried to catch the fall then her foot turned wrong.    Currently in Pain? Yes    Pain Score 1     Pain Location Shoulder    Pain Orientation Left             Treatment today: CGA provided throughout  Nu-Step L1 for 5 min. VC to maintain SPM at > 60 for muscle strengthening  and cardiopulmonary endurance.   Airex pad exercises   Normal BOS: EO/EC 2x60 sec each  NBOS: EO/EC 2x60 sec each. Intermittent shift to RLE and use of R forearm for support. After VC pt able to shift weight over COM and regain balance.   Tandem: EO/EC 2x60 sec/LE under COM. Most difficulty with RLE under COM with noted CKC supination and shakiness in ankle. VC of "push big toe into pad" to return to neutral foot positioning with good carryover after cues.  Airex beam side steps: x6 laps. Slowly progressed from BUE --> No UE support with encouragement. No significant LOB or sway. Good ability to perform task althought pt enticed to use RUE for support.   STS with airex pad under feet. VC to reduce use of RUE. Use of momentum and need for RUE support from chair to stand. 1x10.   Use of  mirror and intermittent VC/TC for improved weigh tshift to reduce R lateral lean. Words of encouragement provided to pt to reduce use of RUE support. Good carryover after cuing.   PT Education - 01/27/21 0851    Education Details Form/technique with exercise.    Person(s) Educated Patient    Methods Explanation;Demonstration;Tactile cues;Verbal cues    Comprehension Verbalized understanding;Returned demonstration            PT Short Term Goals - 01/20/21 1016      PT SHORT TERM GOAL #1   Title Patient will be independent in home exercise program to improve strength/mobility for better functional independence with ADLs.    Baseline 01/20/2021 Pt currently walking at home for exercise. Not fully indep with HEP (issued new handout).    Time 4    Period Weeks    Status On-going    Target Date 02/17/21      PT SHORT TERM GOAL #2   Title Patient will reduce timed up and go to <11 seconds to reduce fall risk and demonstrate improved transfer/gait ability.    Baseline 02/07: 11.93s; 01/20/2021 11.81 seconds    Time 4    Period Weeks    Status Partially Met    Target Date 02/17/21             PT Long Term Goals - 01/20/21 1022      PT LONG TERM GOAL #1   Title Patient will increase FOTO score to equal to or greater than to 67 demonstrate statistically significant improvement in mobility and quality of life.    Baseline 02/07: 60; 01/20/2021 49    Time 8    Period Weeks    Status On-going    Target Date 02/15/21      PT LONG TERM GOAL #2   Title Patient (> 10 years old) will complete five times sit to stand test in < 15 seconds indicating an increased LE strength and improved balance.    Baseline 02/07: 34.13s; 01/20/2021 13.35 sec    Time 8    Period Weeks    Status Achieved    Target Date 02/15/21      PT LONG TERM GOAL #3   Title Patient will increase Berg Balance score by > 6 points to demonstrate decreased fall risk during functional activities.    Baseline 02/07: 35/56;  01/20/2021 50/56    Time 8    Period Weeks    Status Achieved    Target Date 02/15/21      PT LONG TERM GOAL #4   Title Patient will increase six minute walk  test distance to >1000 for progression to community ambulator and improve gait ability    Baseline 02/07: 725 ft, 01/15/21  1020 ft    Time 8    Period Weeks    Status Achieved    Target Date 02/15/21      PT LONG TERM GOAL #5   Title Patient will increase 10 meter walk test to >1.6ms as to improve gait speed for better community ambulation and to reduce fall risk.    Baseline 02/07: 0.84 m/s; 01/20/2021 1.08 m/s close CGA    Time 8    Period Weeks    Status Achieved    Target Date 02/15/21      Additional Long Term Goals   Additional Long Term Goals Yes      PT LONG TERM GOAL #6   Title Patient will increase dynamic gait index score to >19/24 as to demonstrate reduced fall risk and improved dynamic gait balance for better safety with community/home ambulation.    Baseline 01/20/2021 18/24    Time 4    Period Weeks    Status New    Target Date 02/17/21                 Plan - 01/27/21 0934    Clinical Impression Statement Pt progressing with her POC on improving her balance. Pt able to maintain various positions on airex pad with eyes open and eyes closed only requiring intermittent use of R forearm to correct R lateral sway. No LOB noted. Most difficulty with STS with airex pad under feet to mimic standing from a lower surface. Required use of momentum and RUE to assist in standing despite verbal cuing. Pt can continue to benefit from skilled PT services to improve dynamic balance to reduce pt's risk of falls.    Personal Factors and Comorbidities Age    Comorbidities L rotator cuff tear    Examination-Activity Limitations Bathing;Reach Overhead;Lift;Squat    Examination-Participation Restrictions Cleaning    Stability/Clinical Decision Making Stable/Uncomplicated    Rehab Potential Fair    PT Frequency 2x / week     PT Duration 8 weeks    PT Treatment/Interventions Aquatic Therapy;Moist Heat;Gait training;Stair training;Functional mobility training;Therapeutic activities;Therapeutic exercise;Balance training;Neuromuscular re-education;Patient/family education;Manual lymph drainage;Passive range of motion;Dry needling    PT Next Visit Plan Continue with POC, dynamic balance tasks; SLB and cone taps; STS with airex pad under feet and pillow in seat    PT Home Exercise Plan No updates this session    Consulted and Agree with Plan of Care Patient;Family member/caregiver           Patient will benefit from skilled therapeutic intervention in order to improve the following deficits and impairments:  Abnormal gait,Decreased activity tolerance,Decreased endurance,Decreased strength,Decreased balance,Decreased mobility,Difficulty walking  Visit Diagnosis: Unsteadiness on feet  Gait abnormality  Muscle weakness (generalized)  Imbalance     Problem List Patient Active Problem List   Diagnosis Date Noted  . Left rotator cuff tear 02/08/2018    MSalem Caster Fairly IV, PT, DPT Physical Therapist- [facility]and [facility] 01/27/2021, 10:24 ASeabrook IslandMAIN RSpartanburg Regional Medical CenterSERVICES [address] [address]Phone: [phone]  Fax:  [phone] Name: Shannon MachtMRN: [id number] of Birth: [birth year]

## 2021-02-01 ENCOUNTER — Other Ambulatory Visit: Payer: Self-pay

## 2021-02-01 ENCOUNTER — Ambulatory Visit: Payer: Medicare Other

## 2021-02-01 ENCOUNTER — Encounter: Payer: Self-pay | Admitting: Physical Therapy

## 2021-02-01 DIAGNOSIS — M6281 Muscle weakness (generalized): Secondary | ICD-10-CM

## 2021-02-01 DIAGNOSIS — R2681 Unsteadiness on feet: Secondary | ICD-10-CM

## 2021-02-01 DIAGNOSIS — R269 Unspecified abnormalities of gait and mobility: Secondary | ICD-10-CM

## 2021-02-01 NOTE — Therapy (Signed)
[place] MAIN [facility] SERVICES [address] [place], [place], [address] Phone: [phone]   Fax:  [phone]  Physical Therapy Treatment  Patient Details  Name: Shannon Donovan MRN: [id number] Date of Birth: 05/17/1951 Referring Provider (PT): Jennings Books   Encounter Date: 02/01/2021   PT End of Session - 02/01/21 0856    Visit Number 13    Number of Visits 17    Date for PT Re-Evaluation 02/15/21    Authorization Type Medicare    Authorization Time Period 12/21/20-02/15/21    PT Start Time 0852    PT Stop Time 0933    PT Time Calculation (min) 41 min    Equipment Utilized During Treatment Gait belt    Activity Tolerance Patient tolerated treatment well    Behavior During Therapy [facility] for tasks assessed/performed           Past Medical History:  Diagnosis Date  . Adolescent scoliosis   . Anxiety   . Atrial fibrillation ([place])   . Dysrhythmia    A fib  . History of chicken pox   . Hyperlipidemia   . Hyperplastic colon polyp 09/05/2016   Tubular adenoma of colon  . Hypertension    benign/ controlled  . Short-term memory loss   . Shoulder tendinitis    LEFT upper biceps tendonitis, rotator cuff tendinitis  . Stroke ([place]) 93/2671   embolic CVA/ short term memory loss    Past Surgical History:  Procedure Laterality Date  . BACK SURGERY     x2 at age 17 and 39 for scoliosis  . COLONOSCOPY N/A 09/05/2016   Procedure: COLONOSCOPY;  Surgeon: Lollie Sails, MD;  Location: [facility] ENDOSCOPY;  Service: Endoscopy;  Laterality: N/A;  Pt on Coumadin (PT/ INR)  . SHOULDER ARTHROSCOPY WITH OPEN ROTATOR CUFF REPAIR Left 02/08/2018   Procedure: SHOULDER ARTHROSCOPY WITH OPEN ROTATOR CUFF REPAIR;  Surgeon: Corky Mull, MD;  Location: [facility] ORS;  Service: Orthopedics;  Laterality: Left;  . SHOULDER CLOSED REDUCTION Left 05/09/2018   Procedure: CLOSED MANIPULATION SHOULDER  UNDER ANESTHESIA WITH STEROID INJECTION;  Surgeon: Corky Mull, MD;   Location: [place];  Service: Orthopedics;  Laterality: Left;  . TONSILLECTOMY      There were no vitals filed for this visit.   Subjective Assessment - 02/01/21 0854    Subjective Pt denies any near falls or falls. No new changes or concerns since previous session.    Patient is accompained by: --   CNA Izora Gala   Pertinent History 70 y.o. female presents to skilled physical therapy with a recent referral of imbalance. 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 She states she is very cautious and slow when she is ambulating in the community. She does poorly with ambulating in areas with a combination of high ceilings and big crowds such as the mall. She reports of having 1 fall in the house in the past 6 months. She states she was getting up from a surface and slipped and when she tried to catch the fall then her foot turned wrong.    Currently in Pain? No/denies    Pain Score 0-No pain           Treatment today:   Nu-Step L2 for 5 min. VC to maintain SPM at > 60 for muscle strengthening and cardiopulmonary endurance. Pt had to decrease to Level 1 after 2 min due to fatigue in LE's. (Not billed)  CGA for all balance exercises  Tandem stance: EO/EC, 2x30 sec bouts with LLE/RLE under BOS. RLE and LLE were equal sway. Reports from muscular fatigue with RLE underneath BOS however.   Tandem walking on airex beam: x8. Progressed from BUE support x2 to SUE support x2 and 2 finger support x4. No sway, but pt nervous without some type of UE support. Encouragement provided with reduced UE support.  Obstacle course in // bars:    Hurdle step over --> airex pad --> alternating cone taps (3 cones): x8. Mod to max VC's to reduce need of SUE support. Last x3 progressed to intermittent 2 finger support. No sway or LOB noted.   STS with airex pad under feet. 1 pillow in seat: 1x6. VC's for improved eccentric quad control       Pt slightly late to session leading to  decreased time being spent on exercises along with intermittent seated rest breaks between exercises due to reports of LE fatigue.Marland Kitchen Pt required multimodal cuing to reduce reliance on UE support to further challenge pt's dynamic balance.     PT Education - 02/01/21 0855    Education Details form/technique with exercise.    Person(s) Educated Patient    Methods Explanation;Demonstration    Comprehension Verbalized understanding;Returned demonstration            PT Short Term Goals - 01/20/21 1016      PT SHORT TERM GOAL #1   Title Patient will be independent in home exercise program to improve strength/mobility for better functional independence with ADLs.    Baseline 01/20/2021 Pt currently walking at home for exercise. Not fully indep with HEP (issued new handout).    Time 4    Period Weeks    Status On-going    Target Date 02/17/21      PT SHORT TERM GOAL #2   Title Patient will reduce timed up and go to <11 seconds to reduce fall risk and demonstrate improved transfer/gait ability.    Baseline 02/07: 11.93s; 01/20/2021 11.81 seconds    Time 4    Period Weeks    Status Partially Met    Target Date 02/17/21             PT Long Term Goals - 01/20/21 1022      PT LONG TERM GOAL #1   Title Patient will increase FOTO score to equal to or greater than to 67 demonstrate statistically significant improvement in mobility and quality of life.    Baseline 02/07: 60; 01/20/2021 49    Time 8    Period Weeks    Status On-going    Target Date 02/15/21      PT LONG TERM GOAL #2   Title Patient (> 46 years old) will complete five times sit to stand test in < 15 seconds indicating an increased LE strength and improved balance.    Baseline 02/07: 34.13s; 01/20/2021 13.35 sec    Time 8    Period Weeks    Status Achieved    Target Date 02/15/21      PT LONG TERM GOAL #3   Title Patient will increase Berg Balance score by > 6 points to demonstrate decreased fall risk during functional  activities.    Baseline 02/07: 35/56; 01/20/2021 50/56    Time 8    Period Weeks    Status Achieved    Target Date 02/15/21      PT LONG TERM GOAL #4   Title Patient will increase  six minute walk test distance to >1000 for progression to community ambulator and improve gait ability    Baseline 02/07: 725 ft, 01/15/21  1020 ft    Time 8    Period Weeks    Status Achieved    Target Date 02/15/21      PT LONG TERM GOAL #5   Title Patient will increase 10 meter walk test to >1.51ms as to improve gait speed for better community ambulation and to reduce fall risk.    Baseline 02/07: 0.84 m/s; 01/20/2021 1.08 m/s close CGA    Time 8    Period Weeks    Status Achieved    Target Date 02/15/21      Additional Long Term Goals   Additional Long Term Goals Yes      PT LONG TERM GOAL #6   Title Patient will increase dynamic gait index score to >19/24 as to demonstrate reduced fall risk and improved dynamic gait balance for better safety with community/home ambulation.    Baseline 01/20/2021 18/24    Time 4    Period Weeks    Status New    Target Date 02/17/21                 Plan - 02/01/21 1024    Clinical Impression Statement Pt improving with tandem stances with no signs of sway with RLE under BOS. Pt did report muscular fatigue of RLE however. Pt continues to require mod verbal cues and encouragment of performance to further decrease UE use to further challenge pt's dynamic balance with fair carryover as sesison progresses. Pt can continue to benefit from further skilled PT treatment to improve LE strength and balance to reduce risk of falls.    Personal Factors and Comorbidities Age    Comorbidities L rotator cuff tear    Examination-Activity Limitations Bathing;Reach Overhead;Lift;Squat    Examination-Participation Restrictions Cleaning    Stability/Clinical Decision Making Stable/Uncomplicated    Rehab Potential Fair    PT Frequency 2x / week    PT Duration 8 weeks    PT  Treatment/Interventions Aquatic Therapy;Moist Heat;Gait training;Stair training;Functional mobility training;Therapeutic activities;Therapeutic exercise;Balance training;Neuromuscular re-education;Patient/family education;Manual lymph drainage;Passive range of motion;Dry needling    PT Next Visit Plan Continue with POC, dynamic balance tasks; SLB and cone taps; STS with airex pad under feet and pillow in seat    PT Home Exercise Plan No updates this session    Consulted and Agree with Plan of Care Patient;Family member/caregiver           Patient will benefit from skilled therapeutic intervention in order to improve the following deficits and impairments:  Abnormal gait,Decreased activity tolerance,Decreased endurance,Decreased strength,Decreased balance,Decreased mobility,Difficulty walking  Visit Diagnosis: Unsteadiness on feet  Gait abnormality  Muscle weakness (generalized)     Problem List Patient Active Problem List   Diagnosis Date Noted  . Left rotator cuff tear 02/08/2018    MSalem Caster Fairly IV, PT, DPT Physical Therapist- [facility] 02/01/2021, 10:33 AM  [place] [facility] [address] [address]Phone: [phone]  Fax:  [phone] Name: ENyna ChiltonMRN: [id number] of Birth: 7Aug 24, 1952

## 2021-02-03 ENCOUNTER — Other Ambulatory Visit: Payer: Self-pay

## 2021-02-03 ENCOUNTER — Encounter: Payer: Self-pay | Admitting: Physical Therapy

## 2021-02-03 ENCOUNTER — Ambulatory Visit: Payer: Medicare Other

## 2021-02-03 DIAGNOSIS — M6281 Muscle weakness (generalized): Secondary | ICD-10-CM | POA: Diagnosis not present

## 2021-02-03 DIAGNOSIS — R2689 Other abnormalities of gait and mobility: Secondary | ICD-10-CM

## 2021-02-03 DIAGNOSIS — R2681 Unsteadiness on feet: Secondary | ICD-10-CM

## 2021-02-03 DIAGNOSIS — R269 Unspecified abnormalities of gait and mobility: Secondary | ICD-10-CM

## 2021-02-03 NOTE — Therapy (Signed)
[place] MAIN [facility] SERVICES [address] [place], [place], [address] Phone: [phone]   Fax:  [phone]  Physical Therapy Treatment  Patient Details  Name: Shannon Donovan MRN: [id number] Date of Birth: 02-16-1951 Referring Provider (PT): Jennings Books   Encounter Date: 02/03/2021   PT End of Session - 02/03/21 0853    Visit Number 14    Number of Visits 17    Date for PT Re-Evaluation 02/15/21    Authorization Type Medicare    Authorization Time Period 12/21/20-02/15/21    PT Start Time 0849    PT Stop Time 0931    PT Time Calculation (min) 42 min    Equipment Utilized During Treatment Gait belt    Activity Tolerance Patient tolerated treatment well    Behavior During Therapy [facility] for tasks assessed/performed           Past Medical History:  Diagnosis Date  . Adolescent scoliosis   . Anxiety   . Atrial fibrillation ([place])   . Dysrhythmia    A fib  . History of chicken pox   . Hyperlipidemia   . Hyperplastic colon polyp 09/05/2016   Tubular adenoma of colon  . Hypertension    benign/ controlled  . Short-term memory loss   . Shoulder tendinitis    LEFT upper biceps tendonitis, rotator cuff tendinitis  . Stroke ([place]) 36/6294   embolic CVA/ short term memory loss    Past Surgical History:  Procedure Laterality Date  . BACK SURGERY     x2 at age 5 and 37 for scoliosis  . COLONOSCOPY N/A 09/05/2016   Procedure: COLONOSCOPY;  Surgeon: Lollie Sails, MD;  Location: [facility] ENDOSCOPY;  Service: Endoscopy;  Laterality: N/A;  Pt on Coumadin (PT/ INR)  . SHOULDER ARTHROSCOPY WITH OPEN ROTATOR CUFF REPAIR Left 02/08/2018   Procedure: SHOULDER ARTHROSCOPY WITH OPEN ROTATOR CUFF REPAIR;  Surgeon: Corky Mull, MD;  Location: [facility] ORS;  Service: Orthopedics;  Laterality: Left;  . SHOULDER CLOSED REDUCTION Left 05/09/2018   Procedure: CLOSED MANIPULATION SHOULDER  UNDER ANESTHESIA WITH STEROID INJECTION;  Surgeon: Corky Mull, MD;   Location: [place];  Service: Orthopedics;  Laterality: Left;  . TONSILLECTOMY      There were no vitals filed for this visit.   Subjective Assessment - 02/03/21 0853    Subjective Pt reports L shoulder pain but unable to give a number. Hurts with movement.    Patient is accompained by: --   CNA Izora Gala   Pertinent History 70 y.o. female presents to skilled physical therapy with a recent referral of imbalance. 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 She states she is very cautious and slow when she is ambulating in the community. She does poorly with ambulating in areas with a combination of high ceilings and big crowds such as the mall. She reports of having 1 fall in the house in the past 6 months. She states she was getting up from a surface and slipped and when she tried to catch the fall then her foot turned wrong.    Currently in Pain? Yes           Treatment today:  Nu-Step L2 for 3 min. (Not billed)  CGA provided throughout   Cone reaches on airex pad outside BOS 4x9 cones with forward/lateral reaching. VC's to use both hands in order to reduce reliance on UE's for support. 2x9 cones with BUE use with  forward reaches to reduce temptation to use UE for support.  Airex pad basketball shots 3x basketful of balls. Excellent forward weight shifts with normal BOS. Last round performed with narrow BOS. Noted increased sway and use of ankle and hip strategy to correct balance.   Tandem walking on 2x4 wood plank in // bars: x6. Mod VC's for reducing use of RUE. Last 2 reps performed with open hand intermittent use for support. Increased challenge with pt when reduced use of hand support.  STS with airex pad under feet and pillow on seat to challenge balance: 2x10. VC and demo used for improved form/technique and reduce use of post knees on seat for balance with standing. Good carryover after cuing.    Pt required 3 seated rest breaks due to reports of  fatigue in LE's. Multimodal cuing and demo for improved carryover with form/technique and reduced use of UE support to further progress and challenge pt's balance.   PT Education - 02/03/21 0935    Education Details form/technique with balance exercises.    Person(s) Educated Patient    Methods Explanation;Demonstration;Tactile cues;Verbal cues    Comprehension Verbalized understanding;Returned demonstration;Verbal cues required;Need further instruction            PT Short Term Goals - 01/20/21 1016      PT SHORT TERM GOAL #1   Title Patient will be independent in home exercise program to improve strength/mobility for better functional independence with ADLs.    Baseline 01/20/2021 Pt currently walking at home for exercise. Not fully indep with HEP (issued new handout).    Time 4    Period Weeks    Status On-going    Target Date 02/17/21      PT SHORT TERM GOAL #2   Title Patient will reduce timed up and go to <11 seconds to reduce fall risk and demonstrate improved transfer/gait ability.    Baseline 02/07: 11.93s; 01/20/2021 11.81 seconds    Time 4    Period Weeks    Status Partially Met    Target Date 02/17/21             PT Long Term Goals - 01/20/21 1022      PT LONG TERM GOAL #1   Title Patient will increase FOTO score to equal to or greater than to 67 demonstrate statistically significant improvement in mobility and quality of life.    Baseline 02/07: 60; 01/20/2021 49    Time 8    Period Weeks    Status On-going    Target Date 02/15/21      PT LONG TERM GOAL #2   Title Patient (> 23 years old) will complete five times sit to stand test in < 15 seconds indicating an increased LE strength and improved balance.    Baseline 02/07: 34.13s; 01/20/2021 13.35 sec    Time 8    Period Weeks    Status Achieved    Target Date 02/15/21      PT LONG TERM GOAL #3   Title Patient will increase Berg Balance score by > 6 points to demonstrate decreased fall risk during functional  activities.    Baseline 02/07: 35/56; 01/20/2021 50/56    Time 8    Period Weeks    Status Achieved    Target Date 02/15/21      PT LONG TERM GOAL #4   Title Patient will increase six minute walk test distance to >1000 for progression to community ambulator and improve gait ability  Baseline 02/07: 725 ft, 01/15/21  1020 ft    Time 8    Period Weeks    Status Achieved    Target Date 02/15/21      PT LONG TERM GOAL #5   Title Patient will increase 10 meter walk test to >1.25ms as to improve gait speed for better community ambulation and to reduce fall risk.    Baseline 02/07: 0.84 m/s; 01/20/2021 1.08 m/s close CGA    Time 8    Period Weeks    Status Achieved    Target Date 02/15/21      Additional Long Term Goals   Additional Long Term Goals Yes      PT LONG TERM GOAL #6   Title Patient will increase dynamic gait index score to >19/24 as to demonstrate reduced fall risk and improved dynamic gait balance for better safety with community/home ambulation.    Baseline 01/20/2021 18/24    Time 4    Period Weeks    Status New    Target Date 02/17/21                 Plan - 02/03/21 0935    Clinical Impression Statement Pt progressed to tandem balance on wooden 2x4 beam to reduce surface area compared to airex foam. Progressed to unstable surface dynamic balance challenges with weight shifts using cones and balls to reduce pt's use of UE's to further challenge her balance. Limitations in L shoulder ROM due to chronic RTC repair. Great weightshifts outside BOS that were more challenging for pt with feet touching together versus wider BOS. Pt continues to require mod verbal cues to reduce use of hands. Pt can continue to benefit from skilled PT treatment to reduce risk of falls.    Personal Factors and Comorbidities Age    Comorbidities L rotator cuff tear    Examination-Activity Limitations Bathing;Reach Overhead;Lift;Squat    Examination-Participation Restrictions Cleaning     Stability/Clinical Decision Making Stable/Uncomplicated    Rehab Potential Fair    PT Frequency 2x / week    PT Duration 8 weeks    PT Treatment/Interventions Aquatic Therapy;Moist Heat;Gait training;Stair training;Functional mobility training;Therapeutic activities;Therapeutic exercise;Balance training;Neuromuscular re-education;Patient/family education;Manual lymph drainage;Passive range of motion;Dry needling    PT Next Visit Plan Continue with POC, dynamic balance tasks; SLB and cone taps; STS with airex pad under feet and pillow in seat    PT Home Exercise Plan No updates this session    Consulted and Agree with Plan of Care Patient;Family member/caregiver           Patient will benefit from skilled therapeutic intervention in order to improve the following deficits and impairments:  Abnormal gait,Decreased activity tolerance,Decreased endurance,Decreased strength,Decreased balance,Decreased mobility,Difficulty walking  Visit Diagnosis: Unsteadiness on feet  Gait abnormality  Muscle weakness (generalized)  Imbalance     Problem List Patient Active Problem List   Diagnosis Date Noted  . Left rotator cuff tear 02/08/2018    MSalem Caster Fairly IV, PT, DPT Physical Therapist- [facility] 02/03/2021, 12:19 PM  CK[place]MAIN [facility]ick[facility] CtrSERVICES [address] [address]Phone: [phone]  Fax:  [phone] Name: Shannon HedgesMRN: [id number] of Birth: 7Nov 09, 1952

## 2021-02-08 ENCOUNTER — Other Ambulatory Visit: Payer: Self-pay

## 2021-02-08 ENCOUNTER — Ambulatory Visit: Payer: Medicare Other

## 2021-02-08 DIAGNOSIS — M6281 Muscle weakness (generalized): Secondary | ICD-10-CM

## 2021-02-08 DIAGNOSIS — R2681 Unsteadiness on feet: Secondary | ICD-10-CM

## 2021-02-08 DIAGNOSIS — R269 Unspecified abnormalities of gait and mobility: Secondary | ICD-10-CM

## 2021-02-08 NOTE — Therapy (Signed)
[place] MAIN [facility] SERVICES [address] [place], [place], [address] Phone: [phone]   Fax:  [phone]  Physical Therapy Treatment  Patient Details  Name: Jaretzy Lhommedieu MRN: [id number] Date of Birth: 08-06-1951 Referring Provider (PT): Jennings Books   Encounter Date: 02/08/2021   PT End of Session - 02/08/21 1105    Visit Number 15    Number of Visits 17    Date for PT Re-Evaluation 02/15/21    Authorization Type Medicare    Authorization Time Period 12/21/20-02/15/21    PT Start Time 0848    PT Stop Time 0930    PT Time Calculation (min) 42 min    Equipment Utilized During Treatment Gait belt    Activity Tolerance Patient tolerated treatment well    Behavior During Therapy [facility] for tasks assessed/performed           Past Medical History:  Diagnosis Date  . Adolescent scoliosis   . Anxiety   . Atrial fibrillation ([place])   . Dysrhythmia    A fib  . History of chicken pox   . Hyperlipidemia   . Hyperplastic colon polyp 09/05/2016   Tubular adenoma of colon  . Hypertension    benign/ controlled  . Short-term memory loss   . Shoulder tendinitis    LEFT upper biceps tendonitis, rotator cuff tendinitis  . 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 BACK SURGERY     x2 at age 34 and 22 for scoliosis  . COLONOSCOPY N/A 09/05/2016   Procedure: COLONOSCOPY;  Surgeon: Lollie Sails, MD;  Location: [facility] ENDOSCOPY;  Service: Endoscopy;  Laterality: N/A;  Pt on Coumadin (PT/ INR)  . SHOULDER ARTHROSCOPY WITH OPEN ROTATOR CUFF REPAIR Left 02/08/2018   Procedure: SHOULDER ARTHROSCOPY WITH OPEN ROTATOR CUFF REPAIR;  Surgeon: Corky Mull, MD;  Location: [facility] ORS;  Service: Orthopedics;  Laterality: Left;  . SHOULDER CLOSED REDUCTION Left 05/09/2018   Procedure: CLOSED MANIPULATION SHOULDER  UNDER ANESTHESIA WITH STEROID INJECTION;  Surgeon: Corky Mull, MD;   Location: [place];  Service: Orthopedics;  Laterality: Left;  . TONSILLECTOMY      There were no vitals filed for this visit.   Subjective Assessment - 02/08/21 0849    Subjective Pt reports no changes since last session. Pt reports some L UE pain as 0.5/10. Pt has appointment next month with her dr to address L elbow pain.    Patient is accompained by: --   CNA Izora Gala   Pertinent History 70 y.o. female presents to skilled physical therapy with a recent referral of imbalance. 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 She states she is very cautious and slow when she is ambulating in the community. She does poorly with ambulating in areas with a combination of high ceilings and big crowds such as the mall. She reports of having 1 fall in the house in the past 6 months. She states she was getting up from a surface and slipped and when she tried to catch the fall then her foot turned wrong.    Currently in Pain? Yes    Pain Location Elbow    Pain Orientation Left         TREATMENT  Neuro Re-ed:  Cone reaches on airex pad outside BOS x multiple reps forward and side-to-side twists to stack/unstack cones.  VC's  to use both hands in order to reduce reliance on UE's for support.  STS with airex pad under feet 1x6,1x10, CGA; pt cued not to use L UE to push off chair due to L elbow pain; pt rates exercise difficulty between easy and medium  Standing airex pad semi-tandem 2x60 sec B LEs, intermittent UE support on bar,  CGA.  Seated on stability ball marches 2x12, CGA-min assist x1; demo/VC/TC for technique. Pt has difficulty maintaining seated balance.  Seated stability ball core twists 2x10 each direction, CGA; VC/TC/demo for technique  Core twists standing with RTB - 1x10 each direction; VC/TC/demo for technique  Standing marches with 2# dumbbell in LUE to discourage use of UE support and to increase SLB task - 3x15, CGA; VC for technique  Education  provided throughout session in the form of VC/TC and demonstration to facilitate movement at target joints and correct muscle activation with exercises.      PT Short Term Goals - 01/20/21 1016      PT SHORT TERM GOAL #1   Title Patient will be independent in home exercise program to improve strength/mobility for better functional independence with ADLs.    Baseline 01/20/2021 Pt currently walking at home for exercise. Not fully indep with HEP (issued new handout).    Time 4    Period Weeks    Status On-going    Target Date 02/17/21      PT SHORT TERM GOAL #2   Title Patient will reduce timed up and go to <11 seconds to reduce fall risk and demonstrate improved transfer/gait ability.    Baseline 02/07: 11.93s; 01/20/2021 11.81 seconds    Time 4    Period Weeks    Status Partially Met    Target Date 02/17/21             PT Long Term Goals - 01/20/21 1022      PT LONG TERM GOAL #1   Title Patient will increase FOTO score to equal to or greater than to 67 demonstrate statistically significant improvement in mobility and quality of life.    Baseline 02/07: 60; 01/20/2021 49    Time 8    Period Weeks    Status On-going    Target Date 02/15/21      PT LONG TERM GOAL #2   Title Patient (> 49 years old) will complete five times sit to stand test in < 15 seconds indicating an increased LE strength and improved balance.    Baseline 02/07: 34.13s; 01/20/2021 13.35 sec    Time 8    Period Weeks    Status Achieved    Target Date 02/15/21      PT LONG TERM GOAL #3   Title Patient will increase Berg Balance score by > 6 points to demonstrate decreased fall risk during functional activities.    Baseline 02/07: 35/56; 01/20/2021 50/56    Time 8    Period Weeks    Status Achieved    Target Date 02/15/21      PT LONG TERM GOAL #4   Title Patient will increase six minute walk test distance to >1000 for progression to community ambulator and improve gait ability    Baseline 02/07: 725 ft,  01/15/21  1020 ft    Time 8    Period Weeks    Status Achieved    Target Date 02/15/21      PT LONG TERM GOAL #5   Title Patient will increase 10 meter walk test  to >1.43ms as to improve gait speed for better community ambulation and to reduce fall risk.    Baseline 02/07: 0.84 m/s; 01/20/2021 1.08 m/s close CGA    Time 8    Period Weeks    Status Achieved    Target Date 02/15/21      Additional Long Term Goals   Additional Long Term Goals Yes      PT LONG TERM GOAL #6   Title Patient will increase dynamic gait index score to >19/24 as to demonstrate reduced fall risk and improved dynamic gait balance for better safety with community/home ambulation.    Baseline 01/20/2021 18/24    Time 4    Period Weeks    Status New    Target Date 02/17/21                 Plan - 02/08/21 1105    Clinical Impression Statement Pt reports FOF with exercises this session, particularly stability ball exercises. Pt did have difficulty with postural stability with seated stability ball twists and marches, requiring up to min assist x1 to maintain balance. Pt will benefit from further skilled therapy to improve balance and reduce fall risk.    Personal Factors and Comorbidities Age    Comorbidities L rotator cuff tear    Examination-Activity Limitations Bathing;Reach Overhead;Lift;Squat    Examination-Participation Restrictions Cleaning    Stability/Clinical Decision Making Stable/Uncomplicated    Rehab Potential Fair    PT Frequency 2x / week    PT Duration 8 weeks    PT Treatment/Interventions Aquatic Therapy;Moist Heat;Gait training;Stair training;Functional mobility training;Therapeutic activities;Therapeutic exercise;Balance training;Neuromuscular re-education;Patient/family education;Manual lymph drainage;Passive range of motion;Dry needling    PT Next Visit Plan Continue with POC, dynamic balance tasks; SLB and cone taps; STS with airex pad under feet and pillow in seat; incorporate core  strengthening into balance tasks    PT Home Exercise Plan No updates this session    Consulted and Agree with Plan of Care Patient;Family member/caregiver           Patient will benefit from skilled therapeutic intervention in order to improve the following deficits and impairments:  Abnormal gait,Decreased activity tolerance,Decreased endurance,Decreased strength,Decreased balance,Decreased mobility,Difficulty walking  Visit Diagnosis: Unsteadiness on feet  Gait abnormality  Muscle weakness (generalized)     Problem List Patient Active Problem List   Diagnosis Date Noted  . Left rotator cuff tear 02/08/2018   HRicard DillonPT, DPT 02/08/2021, 11:07 AM  [place] [facility] [address] [address]Phone: [phone]  Fax:  [phone] Name: ETreniyah LynnMRN: [id number] of Birth: [birth year]

## 2021-02-10 ENCOUNTER — Ambulatory Visit: Payer: Medicare Other

## 2021-02-10 ENCOUNTER — Other Ambulatory Visit: Payer: Self-pay

## 2021-02-10 DIAGNOSIS — M6281 Muscle weakness (generalized): Secondary | ICD-10-CM | POA: Diagnosis not present

## 2021-02-10 DIAGNOSIS — R269 Unspecified abnormalities of gait and mobility: Secondary | ICD-10-CM

## 2021-02-10 DIAGNOSIS — R2681 Unsteadiness on feet: Secondary | ICD-10-CM

## 2021-02-10 DIAGNOSIS — R2689 Other abnormalities of gait and mobility: Secondary | ICD-10-CM

## 2021-02-10 NOTE — Therapy (Signed)
[place] MAIN [facility] SERVICES [address] [place], [place], [address] Phone: [phone]   Fax:  [phone]  Physical Therapy Treatment  Patient Details  Name: Shannon Donovan MRN: [id number] Date of Birth: August 10, 1951 Referring Provider (PT): Jennings Books   Encounter Date: 02/10/2021   PT End of Session - 02/10/21 0900    Visit Number 16    Number of Visits 17    Date for PT Re-Evaluation 02/15/21    Authorization Type Medicare    Authorization Time Period 12/21/20-02/15/21    PT Start Time 0844    PT Stop Time 0924    PT Time Calculation (min) 40 min    Equipment Utilized During Treatment Gait belt    Activity Tolerance Patient tolerated treatment well;No increased pain    Behavior During Therapy WFL for tasks assessed/performed           Past Medical History:  Diagnosis Date  . Adolescent scoliosis   . Anxiety   . Atrial fibrillation ([place])   . Dysrhythmia    A fib  . History of chicken pox   . Hyperlipidemia   . Hyperplastic colon polyp 09/05/2016   Tubular adenoma of colon  . Hypertension    benign/ controlled  . Short-term memory loss   . Shoulder tendinitis    LEFT upper biceps tendonitis, rotator cuff tendinitis  . Stroke ([place]) 63/8756   embolic CVA/ short term memory loss    Past Surgical History:  Procedure Laterality Date  . BACK SURGERY     x2 at age 36 and 89 for scoliosis  . COLONOSCOPY N/A 09/05/2016   Procedure: COLONOSCOPY;  Surgeon: Lollie Sails, MD;  Location: [facility] ENDOSCOPY;  Service: Endoscopy;  Laterality: N/A;  Pt on Coumadin (PT/ INR)  . SHOULDER ARTHROSCOPY WITH OPEN ROTATOR CUFF REPAIR Left 02/08/2018   Procedure: SHOULDER ARTHROSCOPY WITH OPEN ROTATOR CUFF REPAIR;  Surgeon: Corky Mull, MD;  Location: [facility] ORS;  Service: Orthopedics;  Laterality: Left;  . SHOULDER CLOSED REDUCTION Left 05/09/2018   Procedure: CLOSED MANIPULATION SHOULDER  UNDER ANESTHESIA WITH STEROID INJECTION;  Surgeon:  Corky Mull, MD;  Location: [place];  Service: Orthopedics;  Laterality: Left;  . TONSILLECTOMY      There were no vitals filed for this visit.   Subjective Assessment - 02/10/21 0858    Subjective Pt doing ok today. HEP is fine. She reports her left elbow continues to be in acute flare. This is impacting her shouler flexion A/ROM. Pt reports she is seeing Dr. Roland Rack soon and hopes to get a cortisone injection, that has helped in the past.    Pertinent History 70 y.o. female presents to skilled physical therapy with a recent referral of imbalance. 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 She states she is very cautious and slow when she is ambulating in the community. She does poorly with ambulating in areas with a combination of high ceilings and big crowds such as the mall. She reports of having 1 fall in the house in the past 6 months. She states she was getting up from a surface and slipped and when she tried to catch the fall then her foot turned wrong.    Currently in Pain? No/denies          INTERVENTION THIS DATE:  -overground AMB, supervision; 1052f in 7:30 (fatigued at end) Seated rest -figure 8 walking 2 cones x5, reports to be easy Seated  rest -airex normal stance on airex coloring on wall c red crayon x 60sec -airex narrow stance on airex coloring on wall c blue crayon x 56mnutes Seated rest -2 minutes airex narrow stance pulling 15 items out of ADL kitchen drawer, and placing them at back of counter, then returning them to drawer -airex narrow stance RUE reach-n-tap top kitchen cabinet shelf x15 (supervision)  -STS from chair+airex 1x15, hands free   *discussion about upcoming reassessment: 1. MD recommending rollator; aPryor Curiadoes not agree with this recommendation at this time, would not improve safety, and pt isnot physically able to lift into/outof car 2. Pt still difficult with confident, independent AMB at mall (and open spaces in  general), unsure if this is a new goal of interest of her with or without use of new AD to facilitate unassisted walking for fitness distances 3. Questionable start of step training c device and unlevel, soft surfaces to facilitate ability to walk to mailbox and retrieve mail    PT Short Term Goals - 01/20/21 1016      PT SHORT TERM GOAL #1   Title Patient will be independent in home exercise program to improve strength/mobility for better functional independence with ADLs.    Baseline 01/20/2021 Pt currently walking at home for exercise. Not fully indep with HEP (issued new handout).    Time 4    Period Weeks    Status On-going    Target Date 02/17/21      PT SHORT TERM GOAL #2   Title Patient will reduce timed up and go to <11 seconds to reduce fall risk and demonstrate improved transfer/gait ability.    Baseline 02/07: 11.93s; 01/20/2021 11.81 seconds    Time 4    Period Weeks    Status Partially Met    Target Date 02/17/21             PT Long Term Goals - 01/20/21 1022      PT LONG TERM GOAL #1   Title Patient will increase FOTO score to equal to or greater than to 67 demonstrate statistically significant improvement in mobility and quality of life.    Baseline 02/07: 60; 01/20/2021 49    Time 8    Period Weeks    Status On-going    Target Date 02/15/21      PT LONG TERM GOAL #2   Title Patient (> 629years old) will complete five times sit to stand test in < 15 seconds indicating an increased LE strength and improved balance.    Baseline 02/07: 34.13s; 01/20/2021 13.35 sec    Time 8    Period Weeks    Status Achieved    Target Date 02/15/21      PT LONG TERM GOAL #3   Title Patient will increase Berg Balance score by > 6 points to demonstrate decreased fall risk during functional activities.    Baseline 02/07: 35/56; 01/20/2021 50/56    Time 8    Period Weeks    Status Achieved    Target Date 02/15/21      PT LONG TERM GOAL #4   Title Patient will increase six minute  walk test distance to >1000 for progression to community ambulator and improve gait ability    Baseline 02/07: 725 ft, 01/15/21  1020 ft    Time 8    Period Weeks    Status Achieved    Target Date 02/15/21      PT LONG TERM GOAL #5  Title Patient will increase 10 meter walk test to >1.24ms as to improve gait speed for better community ambulation and to reduce fall risk.    Baseline 02/07: 0.84 m/s; 01/20/2021 1.08 m/s close CGA    Time 8    Period Weeks    Status Achieved    Target Date 02/15/21      Additional Long Term Goals   Additional Long Term Goals Yes      PT LONG TERM GOAL #6   Title Patient will increase dynamic gait index score to >19/24 as to demonstrate reduced fall risk and improved dynamic gait balance for better safety with community/home ambulation.    Baseline 01/20/2021 18/24    Time 4    Period Weeks    Status New    Target Date 02/17/21                 Plan - 02/10/21 0901    Clinical Impression Statement Upcoming reassessment date, returned to some sustained walking, pt tolerating ~7.5 minutes and 10073fprior to fatigue, but not as quickly as 6MWT in early March. AMB in general appears more confident, less falls anxiety. Pt performs fig 8s around cones with confidence, low effort. STS transfers are improving. Continued with current plan of care as laid out in evaluation and recent prior sessions. Pt remains motivated to advance progress toward goals. Rest breaks provided as needed, pt quick to ask when needed. Pt closely monitored throughout session for safe vitals response and to maximize patient safety during interventions. Pt should continue to maximize stable and safe AMB due to chronic established LUE pathology an limitations, the likelihood for surgical intervention or general in ability to use this limb for a device in the future could easily disappear, hence the greatest ability to mobilize without device would ultimately be of great value ensuring  mobility in the future in the event of a medical decline. Pt continues to demonstrate progress toward goals AEB progression of some interventions this date either in volume or intensity.   Personal Factors and Comorbidities Age    Comorbidities L rotator cuff tear    Examination-Activity Limitations Bathing;Reach Overhead;Lift;Squat    Examination-Participation Restrictions Cleaning    Stability/Clinical Decision Making Stable/Uncomplicated    Clinical Decision Making Moderate    Rehab Potential Fair    PT Frequency 2x / week    PT Duration 8 weeks    PT Treatment/Interventions Aquatic Therapy;Moist Heat;Gait training;Stair training;Functional mobility training;Therapeutic activities;Therapeutic exercise;Balance training;Neuromuscular re-education;Patient/family education;Manual lymph drainage;Passive range of motion;Dry needling    PT Next Visit Plan Continue with POC, dynamic balance tasks; SLB and cone taps; STS with airex pad under feet and pillow in seat; incorporate core strengthening into balance tasks    PT Home Exercise Plan No updates this session    Consulted and Agree with Plan of Care Patient;Family member/caregiver    Family Member Consulted aide, NaIzora Gala         Patient will benefit from skilled therapeutic intervention in order to improve the following deficits and impairments:  Abnormal gait,Decreased activity tolerance,Decreased endurance,Decreased strength,Decreased balance,Decreased mobility,Difficulty walking  Visit Diagnosis: Unsteadiness on feet  Gait abnormality  Muscle weakness (generalized)  Imbalance     Problem List Patient Active Problem List   Diagnosis Date Noted  . Left rotator cuff tear 02/08/2018   9:51 AM, 02/10/21 AlEtta GrandchildPT, DPT Physical Therapist - [phone]  Jakson Delpilar C 02/10/2021, 9:14 AM  [place] MAIN [facility] SERVICES [address] [place], [place], [address] Phone: [phone]   Fax:  [phone]  Name: Shannon Donovan MRN: [id number] Date of Birth: [birth year]

## 2021-02-15 ENCOUNTER — Other Ambulatory Visit: Payer: Self-pay

## 2021-02-15 ENCOUNTER — Ambulatory Visit: Payer: Medicare Other | Attending: Neurology

## 2021-02-15 DIAGNOSIS — M6281 Muscle weakness (generalized): Secondary | ICD-10-CM | POA: Diagnosis present

## 2021-02-15 DIAGNOSIS — R269 Unspecified abnormalities of gait and mobility: Secondary | ICD-10-CM | POA: Diagnosis present

## 2021-02-15 DIAGNOSIS — R278 Other lack of coordination: Secondary | ICD-10-CM | POA: Insufficient documentation

## 2021-02-15 DIAGNOSIS — R262 Difficulty in walking, not elsewhere classified: Secondary | ICD-10-CM | POA: Insufficient documentation

## 2021-02-15 DIAGNOSIS — M25612 Stiffness of left shoulder, not elsewhere classified: Secondary | ICD-10-CM | POA: Insufficient documentation

## 2021-02-15 DIAGNOSIS — M2560 Stiffness of unspecified joint, not elsewhere classified: Secondary | ICD-10-CM | POA: Insufficient documentation

## 2021-02-15 DIAGNOSIS — R2689 Other abnormalities of gait and mobility: Secondary | ICD-10-CM | POA: Diagnosis present

## 2021-02-15 DIAGNOSIS — R2681 Unsteadiness on feet: Secondary | ICD-10-CM | POA: Diagnosis not present

## 2021-02-15 NOTE — Therapy (Signed)
[place] MAIN [facility] SERVICES [address] [place], [place], [address] Phone: [phone]   Fax:  [phone]  Physical Therapy Treatment  Patient Details  Name: Shannon Donovan MRN: [id number] Date of Birth: 12-31-1950 Referring Provider (PT): Jennings Books   Encounter Date: 02/15/2021   PT End of Session - 02/15/21 1031    Visit Number 17    Number of Visits 17    Date for PT Re-Evaluation 02/15/21    Authorization Type Medicare    Authorization Time Period 12/21/20-02/15/21    PT Start Time 1019    PT Stop Time 1059    PT Time Calculation (min) 40 min    Equipment Utilized During Treatment Gait belt    Activity Tolerance Patient tolerated treatment well;No increased pain    Behavior During Therapy WFL for tasks assessed/performed           Past Medical History:  Diagnosis Date  . Adolescent scoliosis   . Anxiety   . Atrial fibrillation ([place])   . Dysrhythmia    A fib  . History of chicken pox   . Hyperlipidemia   . Hyperplastic colon polyp 09/05/2016   Tubular adenoma of colon  . Hypertension    benign/ controlled  . Short-term memory loss   . Shoulder tendinitis    LEFT upper biceps tendonitis, rotator cuff tendinitis  . 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 BACK SURGERY     x2 at age 31 and 65 for scoliosis  . COLONOSCOPY N/A 09/05/2016   Procedure: COLONOSCOPY;  Surgeon: Lollie Sails, MD;  Location: [facility] ENDOSCOPY;  Service: Endoscopy;  Laterality: N/A;  Pt on Coumadin (PT/ INR)  . SHOULDER ARTHROSCOPY WITH OPEN ROTATOR CUFF REPAIR Left 02/08/2018   Procedure: SHOULDER ARTHROSCOPY WITH OPEN ROTATOR CUFF REPAIR;  Surgeon: Corky Mull, MD;  Location: [facility] ORS;  Service: Orthopedics;  Laterality: Left;  . SHOULDER CLOSED REDUCTION Left 05/09/2018   Procedure: CLOSED MANIPULATION SHOULDER  UNDER ANESTHESIA WITH STEROID INJECTION;  Surgeon:  Corky Mull, MD;  Location: [place];  Service: Orthopedics;  Laterality: Left;  . TONSILLECTOMY      There were no vitals filed for this visit.   Subjective Assessment - 02/15/21 1029    Subjective Pt reports doing well today. No falls or medical updates since last visit. Pain in left elbow is a about 3-4/10.    Pertinent History 70 y.o. female presents to skilled physical therapy with a recent referral of imbalance. 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 She states she is very cautious and slow when she is ambulating in the community. She does poorly with ambulating in areas with a combination of high ceilings and big crowds such as the mall. She reports of having 1 fall in the house in the past 6 months. She states she was getting up from a surface and slipped and when she tried to catch the fall then her foot turned wrong.    Currently in Pain? Yes    Pain Score 4            INTERVENTION THIS DATE: -AMB overground 1036f, supervision, no device, 6'22" -3" step up fwd c RUE SPC (unable to perform due to falls anxiety)  -2" step up (//bars platform) c SPC RUE, minGuard assist and encouragement x8 -3" plastic step up (in  the// bars) SPC in RUE (still tries to use //bar due to low confidence)  -3" plastic fwd step ups/downs x10 c //BAR support (supervision)   -Fwd step on/off anf AMB over red mat x8 (minguard)  -Alternating FWD/retro on red mat with SPC RUE (minguard)  -lateral stepping on red mat, RUE SPC, cues for sequencing (4 times each way)  -foursquare stepping on firm surface (agility ladder used for a visual cue) 8x minGuard assist, no cane     PT Short Term Goals - 01/20/21 1016      PT SHORT TERM GOAL #1   Title Patient will be independent in home exercise program to improve strength/mobility for better functional independence with ADLs.    Baseline 01/20/2021 Pt currently walking at home for exercise. Not fully indep with HEP (issued  new handout).    Time 4    Period Weeks    Status On-going    Target Date 02/17/21      PT SHORT TERM GOAL #2   Title Patient will reduce timed up and go to <11 seconds to reduce fall risk and demonstrate improved transfer/gait ability.    Baseline 02/07: 11.93s; 01/20/2021 11.81 seconds    Time 4    Period Weeks    Status Partially Met    Target Date 02/17/21             PT Long Term Goals - 01/20/21 1022      PT LONG TERM GOAL #1   Title Patient will increase FOTO score to equal to or greater than to 67 demonstrate statistically significant improvement in mobility and quality of life.    Baseline 02/07: 60; 01/20/2021 49    Time 8    Period Weeks    Status On-going    Target Date 02/15/21      PT LONG TERM GOAL #2   Title Patient (> 35 years old) will complete five times sit to stand test in < 15 seconds indicating an increased LE strength and improved balance.    Baseline 02/07: 34.13s; 01/20/2021 13.35 sec    Time 8    Period Weeks    Status Achieved    Target Date 02/15/21      PT LONG TERM GOAL #3   Title Patient will increase Berg Balance score by > 6 points to demonstrate decreased fall risk during functional activities.    Baseline 02/07: 35/56; 01/20/2021 50/56    Time 8    Period Weeks    Status Achieved    Target Date 02/15/21      PT LONG TERM GOAL #4   Title Patient will increase six minute walk test distance to >1000 for progression to community ambulator and improve gait ability    Baseline 02/07: 725 ft, 01/15/21  1020 ft    Time 8    Period Weeks    Status Achieved    Target Date 02/15/21      PT LONG TERM GOAL #5   Title Patient will increase 10 meter walk test to >1.56ms as to improve gait speed for better community ambulation and to reduce fall risk.    Baseline 02/07: 0.84 m/s; 01/20/2021 1.08 m/s close CGA    Time 8    Period Weeks    Status Achieved    Target Date 02/15/21      Additional Long Term Goals   Additional Long Term Goals Yes       PT LONG TERM GOAL #6  Title Patient will increase dynamic gait index score to >19/24 as to demonstrate reduced fall risk and improved dynamic gait balance for better safety with community/home ambulation.    Baseline 01/20/2021 18/24    Time 4    Period Weeks    Status New    Target Date 02/17/21                 Plan - 02/15/21 1031    Clinical Impression Statement Continued with current plan of care as laid out in evaluation and recent prior sessions. Pt remains motivated to advance progress toward goals. Attempting to see if future SPC use would be a of benefit in future community outings, but after today it appears that the amount of practice time required may be unrealisitc at this time for quick adaptation. Currently patient relies on her aide physically holding on when she goes out. Rest breaks provided as needed, pt quick to ask when needed. Pt does require varying levels of assistance and cuing for completion of exercises for correct form and sometimes due to pain/weakness. Pt closely monitored throughout session for safe vitals response and to maximize patient safety during interventions. Pt is up for reassessment next session, she expresses desire to continue with therapy and improve her safety and independence with basi mobility. She has not yet met any appreciable plateau in performance. Pt continues to demonstrate progress toward goals AEB progression of some interventions this date either in volume or intensity.   Personal Factors and Comorbidities Age    Comorbidities L rotator cuff tear    Examination-Activity Limitations Bathing;Reach Overhead;Lift;Squat    Examination-Participation Restrictions Cleaning    Stability/Clinical Decision Making Stable/Uncomplicated    Clinical Decision Making Moderate    Rehab Potential Fair    PT Frequency 2x / week    PT Duration 8 weeks    PT Treatment/Interventions Aquatic Therapy;Moist Heat;Gait training;Stair training;Functional  mobility training;Therapeutic activities;Therapeutic exercise;Balance training;Neuromuscular re-education;Patient/family education;Manual lymph drainage;Passive range of motion;Dry needling    PT Next Visit Plan Continue with POC, dynamic balance tasks; SLB and cone taps; STS with airex pad under feet and pillow in seat; incorporate core strengthening into balance tasks    PT Home Exercise Plan No updates this session    Consulted and Agree with Plan of Care Patient;Family member/caregiver    Family Member Consulted aide, Izora Gala           Patient will benefit from skilled therapeutic intervention in order to improve the following deficits and impairments:  Abnormal gait,Decreased activity tolerance,Decreased endurance,Decreased strength,Decreased balance,Decreased mobility,Difficulty walking  Visit Diagnosis: Unsteadiness on feet  Gait abnormality  Muscle weakness (generalized)  Imbalance  Limited joint range of motion (ROM)  Stiffness of left shoulder, not elsewhere classified     Problem List Patient Active Problem List   Diagnosis Date Noted  . Left rotator cuff tear 02/08/2018   11:03 AM, 02/15/21 Shannon Donovan, PT, DPT Physical Therapist - [place] [phone]     Shannon Donovan 02/15/2021, 10:43 AM  [place] MAIN [facility] SERVICES [address] [place], [place], [address] Phone: [phone]   Fax:  [phone]  Name: Shannon Donovan MRN: [id number] Date of Birth: 11-16-1950

## 2021-02-17 ENCOUNTER — Ambulatory Visit: Payer: Medicare Other

## 2021-02-17 ENCOUNTER — Encounter: Payer: Self-pay | Admitting: Physical Therapy

## 2021-02-17 ENCOUNTER — Other Ambulatory Visit: Payer: Self-pay

## 2021-02-17 DIAGNOSIS — M6281 Muscle weakness (generalized): Secondary | ICD-10-CM

## 2021-02-17 DIAGNOSIS — R2689 Other abnormalities of gait and mobility: Secondary | ICD-10-CM

## 2021-02-17 DIAGNOSIS — R269 Unspecified abnormalities of gait and mobility: Secondary | ICD-10-CM

## 2021-02-17 DIAGNOSIS — R2681 Unsteadiness on feet: Secondary | ICD-10-CM

## 2021-02-17 NOTE — Therapy (Signed)
[place] MAIN [facility] SERVICES [address] [place], [place], [address] Phone: [phone]   Fax:  [phone]  Physical Therapy Treatment/Recert Note   Patient Details  Name: Shannon Donovan MRN: [id number] Date of Birth: Aug 25, 1951 Referring Provider (PT): Jennings Books   Encounter Date: 02/17/2021   PT End of Session - 02/17/21 1150    Visit Number 18    Number of Visits 29    Date for PT Re-Evaluation 03/31/21    Authorization Type Medicare    Authorization Time Period 02/17/21- 03/31/21    PT Start Time 0929    PT Stop Time 1013    PT Time Calculation (min) 44 min    Equipment Utilized During Treatment Gait belt    Activity Tolerance Patient tolerated treatment well;No increased pain    Behavior During Therapy WFL for tasks assessed/performed           Past Medical History:  Diagnosis Date  . Adolescent scoliosis   . Anxiety   . Atrial fibrillation ([place])   . Dysrhythmia    A fib  . History of chicken pox   . Hyperlipidemia   . Hyperplastic colon polyp 09/05/2016   Tubular adenoma of colon  . Hypertension    benign/ controlled  . Short-term memory loss   . Shoulder tendinitis    LEFT upper biceps tendonitis, rotator cuff tendinitis  . Stroke ([place]) 38/9373   embolic CVA/ short term memory loss    Past Surgical History:  Procedure Laterality Date  . BACK SURGERY     x2 at age 70 and 1 for scoliosis  . COLONOSCOPY N/A 09/05/2016   Procedure: COLONOSCOPY;  Surgeon: Lollie Sails, MD;  Location: [facility] ENDOSCOPY;  Service: Endoscopy;  Laterality: N/A;  Pt on Coumadin (PT/ INR)  . SHOULDER ARTHROSCOPY WITH OPEN ROTATOR CUFF REPAIR Left 02/08/2018   Procedure: SHOULDER ARTHROSCOPY WITH OPEN ROTATOR CUFF REPAIR;  Surgeon: Corky Mull, MD;  Location: [facility] ORS;  Service: Orthopedics;  Laterality: Left;  . SHOULDER CLOSED REDUCTION Left 05/09/2018   Procedure: CLOSED MANIPULATION SHOULDER  UNDER ANESTHESIA WITH STEROID  INJECTION;  Surgeon: Corky Mull, MD;  Location: [place];  Service: Orthopedics;  Laterality: Left;  . TONSILLECTOMY      There were no vitals filed for this visit.   Subjective Assessment - 02/17/21 0931    Subjective Pt reports L elbow pain today. No falls or updates since previous session.    Pertinent History 70 y.o. female presents to skilled physical therapy with a recent referral of imbalance. 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 She states she is very cautious and slow when she is ambulating in the community. She does poorly with ambulating in areas with a combination of high ceilings and big crowds such as the mall. She reports of having 1 fall in the house in the past 6 months. She states she was getting up from a surface and slipped and when she tried to catch the fall then her foot turned wrong.    Currently in Pain? Yes    Pain Score 4     Pain Location Elbow    Pain Orientation Left    Pain Descriptors / Indicators Aching              [facility] PT Assessment - 02/17/21 0938      Standardized Balance Assessment   Standardized Balance Assessment Dynamic Gait Index  Dynamic Gait Index   Level Surface Normal    Change in Gait Speed Mild Impairment    Gait with Horizontal Head Turns Normal    Gait with Vertical Head Turns Mild Impairment    Gait and Pivot Turn Normal    Step Over Obstacle Moderate Impairment    Step Around Obstacles Mild Impairment    Steps Mild Impairment    Total Score 18            Today's session involved updating pt's goals. Pt making progress towards her long term goals. New 6 week POC being added with 2 additional long term goals. Refer to goals section and clinical impression for specifics.     PT Education - 02/17/21 1150    Education Details POC for 6 additional weeks. Progress towards goals per POC.    Person(s) Educated Patient    Methods Explanation;Demonstration    Comprehension  Verbalized understanding;Returned demonstration            PT Short Term Goals - 02/17/21 1203      PT SHORT TERM GOAL #1   Title Patient will be independent in home exercise program to improve strength/mobility for better functional independence with ADLs.    Baseline 01/20/2021 Pt currently walking at home for exercise. Not fully indep with HEP (issued new handout). 4/6: indep with HEP. Doesnt rely on caregiver to assist.    Time 4    Period Weeks    Status Achieved    Target Date 02/17/21      PT SHORT TERM GOAL #2   Title Patient will reduce timed up and go to <11 seconds to reduce fall risk and demonstrate improved transfer/gait ability.    Baseline 02/07: 11.93s; 01/20/2021 11.81 seconds; 4/6: 10.39 sec    Time 4    Period Weeks    Status Achieved    Target Date 02/17/21             PT Long Term Goals - 02/17/21 1157      PT LONG TERM GOAL #1   Title Patient will increase FOTO score to equal to or greater than to 67 demonstrate statistically significant improvement in mobility and quality of life.    Baseline 02/07: 60; 01/20/2021 49; 4/6: Deferred due to internet connectivity issues. Will update next session.    Time 6    Period Weeks    Status On-going    Target Date 03/31/21      PT LONG TERM GOAL #2   Title Patient (> 9 years old) will complete five times sit to stand test in < 15 seconds indicating an increased LE strength and improved balance.    Baseline 02/07: 34.13s; 01/20/2021 13.35 sec    Time 8    Period Weeks    Status Achieved      PT LONG TERM GOAL #3   Title Patient will increase Berg Balance score by > 6 points to demonstrate decreased fall risk during functional activities.    Baseline 02/07: 35/56; 01/20/2021 50/56    Time 8    Period Weeks    Status Achieved      PT LONG TERM GOAL #4   Title Patient will increase six minute walk test distance to >1000 for progression to community ambulator and improve gait ability    Baseline 02/07: 725 ft,  01/15/21  1020 ft    Time 8    Period Weeks    Status Achieved  PT LONG TERM GOAL #5   Title Patient will increase 10 meter walk test to >1.42m/s as to improve gait speed for better community ambulation and to reduce fall risk.    Baseline 02/07: 0.84 m/s; 01/20/2021 1.08 m/s close CGA    Time 8    Period Weeks    Status Achieved      Additional Long Term Goals   Additional Long Term Goals Yes      PT LONG TERM GOAL #6   Title Patient will increase dynamic gait index score to >19/24 as to demonstrate reduced fall risk and improved dynamic gait balance for better safety with community/home ambulation.    Baseline 01/20/2021 18/24; 4/6: 18/24. Stepping over box is big limiting factor from meeting goal    Time 6    Period Weeks    Status New    Target Date 03/31/21      PT LONG TERM GOAL #7   Title Pt will improve 6MWT to > or equal to 1700' for age norms to display safe ability to complete community ambulatn tasks without need for caregiver HHA support.    Baseline 4/6: 1,065'    Time 6    Period Weeks    Status New    Target Date 03/31/21      PT LONG TERM GOAL #8   Title Pt will improve ABC balance scale to > 67% to display clinicially significant decreased risk of falls with household ADL completion    Baseline 4/6: 51.25%    Time 6    Period Weeks    Status New    Target Date 03/31/21                 Plan - 02/17/21 1152    Clinical Impression Statement Pt has displayed good progress in her current POC. Pt is now independent with HEP reporting no need of care giver support to complete and performs exercises daily. Pt accomplished her TUG goal scoring < 11 seconds placing pt at a decreased risk of falls. However pt scored an 18/24 on the DGI which was her previous score with a cutoff of 19/24 displaying a continued increased risk of falls in community tasks. Pt on the verge of the cut-off with the limiting factor limiting pt is safely stepping over the shoe box. FOTO  was deferred today due to internet connection issues so unable to assess progress with regards to FOTO score. Two additional goals will be added to this new PT recert of a 6 MWT with a target score of matching her age range norm and the ABC scale which assess pt's confidence in ADL's that challenge balance. PT POC is an additional 6 weeks of therapy to continue to address pt's balance deficits to reduce her risk of falls and need for caregiver support.    Personal Factors and Comorbidities Age    Comorbidities L rotator cuff tear    Examination-Activity Limitations Bathing;Reach Overhead;Lift;Squat    Examination-Participation Restrictions Cleaning    Stability/Clinical Decision Making Stable/Uncomplicated    Rehab Potential Fair    PT Frequency 2x / week    PT Duration 8 weeks    PT Treatment/Interventions Aquatic Therapy;Moist Heat;Gait training;Stair training;Functional mobility training;Therapeutic activities;Therapeutic exercise;Balance training;Neuromuscular re-education;Patient/family education;Manual lymph drainage;Passive range of motion;Dry needling    PT Next Visit Plan PLEASE UPDATE FOTO GOAL    PT Home Exercise Plan No updates this session    Consulted and Agree with Plan of Care Patient;Family member/caregiver  Family Member Consulted aide, Izora Gala           Patient will benefit from skilled therapeutic intervention in order to improve the following deficits and impairments:  Abnormal gait,Decreased activity tolerance,Decreased endurance,Decreased strength,Decreased balance,Decreased mobility,Difficulty walking  Visit Diagnosis: Unsteadiness on feet  Gait abnormality  Muscle weakness (generalized)  Imbalance     Problem List Patient Active Problem List   Diagnosis Date Noted  . Left rotator cuff tear 02/08/2018    Salem Caster. Fairly IV, PT, DPT Physical Therapist- [facility]  02/17/2021, 12:12 PM  [place] MAIN [facility] SERVICES [address] [place], [place], [address] Phone: [phone]   Fax:  [phone]  Name: Veronika Heard MRN: [id number] Date of Birth: Dec 15, 1950

## 2021-02-22 ENCOUNTER — Ambulatory Visit: Payer: Medicare Other

## 2021-02-22 ENCOUNTER — Other Ambulatory Visit: Payer: Self-pay

## 2021-02-22 DIAGNOSIS — R2681 Unsteadiness on feet: Secondary | ICD-10-CM

## 2021-02-22 DIAGNOSIS — M6281 Muscle weakness (generalized): Secondary | ICD-10-CM

## 2021-02-22 DIAGNOSIS — R2689 Other abnormalities of gait and mobility: Secondary | ICD-10-CM

## 2021-02-22 NOTE — Therapy (Signed)
[place] MAIN [facility] SERVICES [address] [place], [place], [address] Phone: [phone]   Fax:  [phone]  Physical Therapy Treatment  Patient Details  Name: Shannon Donovan MRN: [id number] Date of Birth: Feb 05, 1951 Referring Provider (PT): Jennings Books   Encounter Date: 02/22/2021   PT End of Session - 02/22/21 1033    Visit Number 19    Number of Visits 29    Date for PT Re-Evaluation 03/31/21    Authorization Type Medicare    Authorization Time Period 02/17/21- 03/31/21    PT Start Time 0804    PT Stop Time 0845    PT Time Calculation (min) 41 min    Equipment Utilized During Treatment Gait belt    Activity Tolerance Patient tolerated treatment well;No increased pain    Behavior During Therapy WFL for tasks assessed/performed           Past Medical History:  Diagnosis Date  . Adolescent scoliosis   . Anxiety   . Atrial fibrillation ([place])   . Dysrhythmia    A fib  . History of chicken pox   . Hyperlipidemia   . Hyperplastic colon polyp 09/05/2016   Tubular adenoma of colon  . Hypertension    benign/ controlled  . Short-term memory loss   . Shoulder tendinitis    LEFT upper biceps tendonitis, rotator cuff tendinitis  . 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 BACK SURGERY     x2 at age 66 and 45 for scoliosis  . COLONOSCOPY N/A 09/05/2016   Procedure: COLONOSCOPY;  Surgeon: Lollie Sails, MD;  Location: [facility] ENDOSCOPY;  Service: Endoscopy;  Laterality: N/A;  Pt on Coumadin (PT/ INR)  . SHOULDER ARTHROSCOPY WITH OPEN ROTATOR CUFF REPAIR Left 02/08/2018   Procedure: SHOULDER ARTHROSCOPY WITH OPEN ROTATOR CUFF REPAIR;  Surgeon: Corky Mull, MD;  Location: [facility] ORS;  Service: Orthopedics;  Laterality: Left;  . SHOULDER CLOSED REDUCTION Left 05/09/2018   Procedure: CLOSED MANIPULATION SHOULDER  UNDER ANESTHESIA WITH STEROID INJECTION;   Surgeon: Corky Mull, MD;  Location: [place];  Service: Orthopedics;  Laterality: Left;  . TONSILLECTOMY      There were no vitals filed for this visit.   Subjective Assessment - 02/22/21 0805    Subjective Pt reports L UE pain at rest is 7-8/10.    Pertinent History 70 y.o. female presents to skilled physical therapy with a recent referral of imbalance. 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 She states she is very cautious and slow when she is ambulating in the community. She does poorly with ambulating in areas with a combination of high ceilings and big crowds such as the mall. She reports of having 1 fall in the house in the past 6 months. She states she was getting up from a surface and slipped and when she tried to catch the fall then her foot turned wrong.    Currently in Pain? Yes    Pain Score 8     Pain Location Arm    Pain Orientation Left          TREATMENT   Neuro Re-ed: CGA provided for all exercises  Cone reaches/twists on airex pad outside BOS, WBOS and NBOS x multiple reps for each to stack/unstack cones.  Frequent VC's for technique/decrease UE support.   Step onto and over airex  pad intermittent UE support 15x  Step over hurdle with intermittent UE support 15x; difficulty taking increased step/clearing LEs over step  Side step onto airex and over orange hurdle with 2# AW on BLEs - 10x   SLB - multiple attempts, pt unable to maintain without UUE support. Limited by reports of FOF.   One leg on airex, one on 6" step on airex - 60 sec each LE with no UE support; slight decrease in postural stability but no LOB  Cone taps with BLEs with decreasing levels of UE support (ended with intermittent UE support) 2x10 for each LE. Pt difficulty lifting LLE and takes step and lateral appraoch to cone taps.  Education provided throughout session in the form of VC/TC and demonstration to facilitate movement at target joints and correct  muscle activation with exercises.   Assessment: Pt continues to report FOF with some static balance exercises, particularly ones requiring SLB, showing difficulty lifting LLE. With light-touch support of up to three fingers pt is able to lift LLE. The pt also demonstrates a lateral approach with small steps to perform cone taps. Her technique with this exercise is also likely limited due to FOF. Otherwise, pt does demonstrate increased ankle strategy with SLB exercises. The pt will benefit from further skilled therapy to improve balance, and BLE strength in order to increase safety with ADLs.    PT Education - 02/22/21 1033    Education Details exericse technique, body mechancs.    Person(s) Educated Patient    Methods Explanation;Demonstration;Verbal cues    Comprehension Verbalized understanding;Returned demonstration            PT Short Term Goals - 02/17/21 1203      PT SHORT TERM GOAL #1   Title Patient will be independent in home exercise program to improve strength/mobility for better functional independence with ADLs.    Baseline 01/20/2021 Pt currently walking at home for exercise. Not fully indep with HEP (issued new handout). 4/6: indep with HEP. Doesnt rely on caregiver to assist.    Time 4    Period Weeks    Status Achieved    Target Date 02/17/21      PT SHORT TERM GOAL #2   Title Patient will reduce timed up and go to <11 seconds to reduce fall risk and demonstrate improved transfer/gait ability.    Baseline 02/07: 11.93s; 01/20/2021 11.81 seconds; 4/6: 10.39 sec    Time 4    Period Weeks    Status Achieved    Target Date 02/17/21             PT Long Term Goals - 02/17/21 1157      PT LONG TERM GOAL #1   Title Patient will increase FOTO score to equal to or greater than to 67 demonstrate statistically significant improvement in mobility and quality of life.    Baseline 02/07: 60; 01/20/2021 49; 4/6: Deferred due to internet connectivity issues. Will update next  session.    Time 6    Period Weeks    Status On-going    Target Date 03/31/21      PT LONG TERM GOAL #2   Title Patient (> 24 years old) will complete five times sit to stand test in < 15 seconds indicating an increased LE strength and improved balance.    Baseline 02/07: 34.13s; 01/20/2021 13.35 sec    Time 8    Period Weeks    Status Achieved      PT LONG TERM  GOAL #3   Title Patient will increase Berg Balance score by > 6 points to demonstrate decreased fall risk during functional activities.    Baseline 02/07: 35/56; 01/20/2021 50/56    Time 8    Period Weeks    Status Achieved      PT LONG TERM GOAL #4   Title Patient will increase six minute walk test distance to >1000 for progression to community ambulator and improve gait ability    Baseline 02/07: 725 ft, 01/15/21  1020 ft    Time 8    Period Weeks    Status Achieved      PT LONG TERM GOAL #5   Title Patient will increase 10 meter walk test to >1.25m/s as to improve gait speed for better community ambulation and to reduce fall risk.    Baseline 02/07: 0.84 m/s; 01/20/2021 1.08 m/s close CGA    Time 8    Period Weeks    Status Achieved      Additional Long Term Goals   Additional Long Term Goals Yes      PT LONG TERM GOAL #6   Title Patient will increase dynamic gait index score to >19/24 as to demonstrate reduced fall risk and improved dynamic gait balance for better safety with community/home ambulation.    Baseline 01/20/2021 18/24; 4/6: 18/24. Stepping over box is big limiting factor from meeting goal    Time 6    Period Weeks    Status New    Target Date 03/31/21      PT LONG TERM GOAL #7   Title Pt will improve 6MWT to > or equal to 1700' for age norms to display safe ability to complete community ambulatn tasks without need for caregiver HHA support.    Baseline 4/6: 1,065'    Time 6    Period Weeks    Status New    Target Date 03/31/21      PT LONG TERM GOAL #8   Title Pt will improve ABC balance scale to >  67% to display clinicially significant decreased risk of falls with household ADL completion    Baseline 4/6: 51.25%    Time 6    Period Weeks    Status New    Target Date 03/31/21                 Plan - 02/22/21 1040    Clinical Impression Statement Pt continues to report FOF with some static balance exercises, particularly ones requiring SLB, showing difficulty lifting LLE. With light-touch support of up to three fingers pt is able to lift LLE. The pt also demonstrates a lateral approach with small steps to perform cone taps. Her technique with this exercise is also likely limited due to FOF. Otherwise, pt does demonstrate increased ankle strategy with SLB exercises. The pt will benefit from further skilled therapy to improve balance, and BLE strength in order to increase safety with ADLs.    Personal Factors and Comorbidities Age    Comorbidities L rotator cuff tear    Examination-Activity Limitations Bathing;Reach Overhead;Lift;Squat    Examination-Participation Restrictions Cleaning    Stability/Clinical Decision Making Stable/Uncomplicated    Rehab Potential Fair    PT Frequency 2x / week    PT Duration 8 weeks    PT Treatment/Interventions Aquatic Therapy;Moist Heat;Gait training;Stair training;Functional mobility training;Therapeutic activities;Therapeutic exercise;Balance training;Neuromuscular re-education;Patient/family education;Manual lymph drainage;Passive range of motion;Dry needling    PT Next Visit Plan progress SLB tasks and FOTO  PT Home Exercise Plan No updates this session    Consulted and Agree with Plan of Care Patient;Family member/caregiver    Family Member Consulted aide, Izora Gala           Patient will benefit from skilled therapeutic intervention in order to improve the following deficits and impairments:  Abnormal gait,Decreased activity tolerance,Decreased endurance,Decreased strength,Decreased balance,Decreased mobility,Difficulty walking  Visit  Diagnosis: Other abnormalities of gait and mobility  Muscle weakness (generalized)  Unsteadiness on feet     Problem List Patient Active Problem List   Diagnosis Date Noted  . Left rotator cuff tear 02/08/2018   Ricard Dillon PT, DPT 02/22/2021, 10:41 AM  [place] MAIN [facility] SERVICES [address] [place], [place], [address] Phone: [phone]   Fax:  [phone]  Name: Shannon Donovan MRN: [id number] Date of Birth: 18-Dec-1950

## 2021-02-24 ENCOUNTER — Other Ambulatory Visit: Payer: Self-pay

## 2021-02-24 ENCOUNTER — Ambulatory Visit: Payer: Medicare Other

## 2021-02-24 DIAGNOSIS — R2689 Other abnormalities of gait and mobility: Secondary | ICD-10-CM

## 2021-02-24 DIAGNOSIS — R2681 Unsteadiness on feet: Secondary | ICD-10-CM

## 2021-02-24 NOTE — Therapy (Signed)
[place] MAIN [facility] SERVICES [address] [place], [place], [address] Phone: [phone]   Fax:  [phone]  Physical Therapy Treatment/Physical Therapy Progress Note/RECERTIFICATION  Dates of reporting period  01/20/2021  to  02/24/2021  Patient Details  Name: Shannon Donovan MRN: [id number] Date of Birth: 1951-03-19 Referring Provider (PT): Jennings Books   Encounter Date: 02/24/2021   PT End of Session - 02/24/21 1244    Visit Number 20    Number of Visits 45    Date for PT Re-Evaluation 04/21/21    Authorization Type Medicare    Authorization Time Period 02/17/21- 03/31/21    PT Start Time 0803    PT Stop Time 0845    PT Time Calculation (min) 42 min    Equipment Utilized During Treatment Gait belt    Activity Tolerance Patient tolerated treatment well    Behavior During Therapy [facility] for tasks assessed/performed           Past Medical History:  Diagnosis Date  . Adolescent scoliosis   . Anxiety   . Atrial fibrillation ([place])   . Dysrhythmia    A fib  . History of chicken pox   . Hyperlipidemia   . Hyperplastic colon polyp 09/05/2016   Tubular adenoma of colon  . Hypertension    benign/ controlled  . Short-term memory loss   . Shoulder tendinitis    LEFT upper biceps tendonitis, rotator cuff tendinitis  . Stroke ([place]) 42/3953   embolic CVA/ short term memory loss    Past Surgical History:  Procedure Laterality Date  . BACK SURGERY     x2 at age 74 and 35 for scoliosis  . COLONOSCOPY N/A 09/05/2016   Procedure: COLONOSCOPY;  Surgeon: Lollie Sails, MD;  Location: [facility] ENDOSCOPY;  Service: Endoscopy;  Laterality: N/A;  Pt on Coumadin (PT/ INR)  . SHOULDER ARTHROSCOPY WITH OPEN ROTATOR CUFF REPAIR Left 02/08/2018   Procedure: SHOULDER ARTHROSCOPY WITH OPEN ROTATOR CUFF REPAIR;  Surgeon: Corky Mull, MD;  Location: [facility] ORS;  Service: Orthopedics;  Laterality: Left;  . SHOULDER CLOSED REDUCTION Left 05/09/2018    Procedure: CLOSED MANIPULATION SHOULDER  UNDER ANESTHESIA WITH STEROID INJECTION;  Surgeon: Corky Mull, MD;  Location: [place];  Service: Orthopedics;  Laterality: Left;  . TONSILLECTOMY      There were no vitals filed for this visit.   Subjective Assessment - 02/24/21 1242    Subjective Pt caregiver Izora Gala present. Pt reports some LUE pain today. She denies any falls or near-falls since last visit. Pt says she feel she is doing well with balance.    Pertinent History 70 y.o. female presents to skilled physical therapy with a recent referral of imbalance. 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 She states she is very cautious and slow when she is ambulating in the community. She does poorly with ambulating in areas with a combination of high ceilings and big crowds such as the mall. She reports of having 1 fall in the house in the past 6 months. She states she was getting up from a surface and slipped and when she tried to catch the fall then her foot turned wrong.    Currently in Pain? Yes    Pain Location Shoulder    Pain Orientation Left              [facility] PT Assessment - 02/24/21 0807      Dynamic Gait Index  Level Surface Normal    Change in Gait Speed Mild Impairment    Gait with Horizontal Head Turns Mild Impairment    Gait with Vertical Head Turns Normal    Gait and Pivot Turn Normal    Step Over Obstacle Normal    Step Around Obstacles Normal    Steps Normal    Total Score 22           TREATMENT- REVIEW OF GOALS   THEREX:  6MWT: 1195 ft  FOTO: 56%  NEURO:   DGI: 22/24 (met) indicating decreased fall risk   ABC scale: 83.12 (met)  Step onto and off of 6" step, CGA, with decreasing levels of UE support 1x10; VC for technique   Ascending/descending stairs with CGA, from BUE support on rails to intermittent. Pt reports FOF with less UE support. First set used step-to pattern, but was able to use reciprocal with  cuing.  Education provided throughout with VC/TC/demonstration to promote movement at target joints and correct muscle activation with exercises and testing. Pt demonstrates good carryover within session.   Assessment: Goals retested for progress note. Pt has met ABC scale goal and DGI goal, where scores were the following: 22/24 and 83.12%. These scores indicate decreased risk of falls and improved confidence in pt ability to maintain balance. Pt making gains toward 6MWT goal (1195 ft), and saw increase in FOTO score (56%) from previous time assessed. Although pt shows improvement, pt 6MWT and FOTO score indicate need for further improvement in overall functional mobility, QOL and gait capacity/ability. Due to pt progress, motivation during and outside of therapy, the pt frequency can be decreased to 1x/week. Patient's condition has the potential to improve in response to therapy. Maximum improvement is yet to be obtained. The anticipated improvement is attainable and reasonable in a generally predictable time.  Patient reports she feels balance is improving but still reports difficulty with stairs and curbs. The pt will benefit from further skilled therapy to improve overall functional mobility, gait ability/capacity, and balance to increase safety and ease with ADLs and community participation.      PT Education - 02/24/21 1244    Education Details Findings of retesting, indications for pt progress/POC    Person(s) Educated Patient;Caregiver(s)    Methods Explanation    Comprehension Verbalized understanding            PT Short Term Goals - 02/24/21 1248      PT SHORT TERM GOAL #1   Title Patient will be independent in home exercise program to improve strength/mobility for better functional independence with ADLs.    Baseline 01/20/2021 Pt currently walking at home for exercise. Not fully indep with HEP (issued new handout). 4/6: indep with HEP. Doesnt rely on caregiver to assist.    Time 4     Period Weeks    Status Achieved    Target Date 02/17/21      PT SHORT TERM GOAL #2   Title Patient will reduce timed up and go to <11 seconds to reduce fall risk and demonstrate improved transfer/gait ability.    Baseline 02/07: 11.93s; 01/20/2021 11.81 seconds; 4/6: 10.39 sec    Time 4    Period Weeks    Status Achieved    Target Date 02/17/21             PT Long Term Goals - 02/24/21 0829      PT LONG TERM GOAL #1   Title Patient will increase FOTO score to equal  to or greater than to 67 demonstrate statistically significant improvement in mobility and quality of life.    Baseline 02/07: 60; 01/20/2021 49; 4/6: Deferred due to internet connectivity issues. Will update next session. 4/13: 56    Time 8    Period Weeks    Status On-going    Target Date 04/21/21      PT LONG TERM GOAL #2   Title Patient (> 107 years old) will complete five times sit to stand test in < 15 seconds indicating an increased LE strength and improved balance.    Baseline 02/07: 34.13s; 01/20/2021 13.35 sec    Time 8    Period Weeks    Status Achieved      PT LONG TERM GOAL #3   Title Patient will increase Berg Balance score by > 6 points to demonstrate decreased fall risk during functional activities.    Baseline 02/07: 35/56; 01/20/2021 50/56    Time 8    Period Weeks    Status Achieved      PT LONG TERM GOAL #4   Title Patient will increase six minute walk test distance to >1000 for progression to community ambulator and improve gait ability    Baseline 02/07: 725 ft, 01/15/21  1020 ft;    Time 8    Period Weeks    Status Achieved      PT LONG TERM GOAL #5   Title Patient will increase 10 meter walk test to >1.54ms as to improve gait speed for better community ambulation and to reduce fall risk.    Baseline 02/07: 0.84 m/s; 01/20/2021 1.08 m/s close CGA    Time 8    Period Weeks    Status Achieved      PT LONG TERM GOAL #6   Title Patient will increase dynamic gait index score to >19/24 as to  demonstrate reduced fall risk and improved dynamic gait balance for better safety with community/home ambulation.    Baseline 01/20/2021 18/24; 4/6: 18/24. Stepping over box is big limiting factor from meeting goal; 4/13: 22/24    Time 6    Period Weeks    Status Achieved      PT LONG TERM GOAL #7   Title Pt will improve 6MWT to > or equal to 1700' for age norms to display safe ability to complete community ambulatn tasks without need for caregiver HHA support.    Baseline 4/6: 1,065'; 4/13: 1195 ft    Time 8    Period Weeks    Status On-going    Target Date 04/21/21      PT LONG TERM GOAL #8   Title Pt will improve ABC balance scale to > 67% to display clinicially significant decreased risk of falls with household ADL completion    Baseline 4/6: 51.25%; 4/13: 83.12%    Time 6    Period Weeks    Status Achieved                 Plan - 02/24/21 1256    Clinical Impression Statement Goals retested for progress note. Pt has met ABC scale goal and DGI goal, where scores were the following: 22/24 and 83.12%. These scores indicate decreased risk of falls and improved confidence in pt ability to maintain balance. Pt making gains toward 6MWT goal (1195 ft), and saw increase in FOTO score (56%) from previous time assessed. Although pt shows improvement, pt 6MWT and FOTO score indicate need for further improvement in overall functional mobility, QOL  and gait capacity/ability. Due to pt progress, motivation during and outside of therapy, the pt frequency can be decreased to 1x/week. Patient's condition has the potential to improve in response to therapy. Maximum improvement is yet to be obtained. The anticipated improvement is attainable and reasonable in a generally predictable time.  Patient reports she feels balance is improving but still reports difficulty with stairs and curbs. The pt will benefit from further skilled therapy to improve overall functional mobility, gait ability/capacity, and  balance to increase safety and ease with ADLs and community participation.    Personal Factors and Comorbidities Age    Comorbidities L rotator cuff tear    Examination-Activity Limitations Bathing;Reach Overhead;Lift;Squat    Examination-Participation Restrictions Cleaning    Stability/Clinical Decision Making Stable/Uncomplicated    Rehab Potential Good    PT Frequency 1x / week    PT Duration 8 weeks    PT Treatment/Interventions Aquatic Therapy;Moist Heat;Gait training;Stair training;Functional mobility training;Therapeutic activities;Therapeutic exercise;Balance training;Neuromuscular re-education;Patient/family education;Manual lymph drainage;Passive range of motion;Dry needling    PT Next Visit Plan Amb. over curbs, stairs, hills    PT Home Exercise Plan No updates this session    Consulted and Agree with Plan of Care Patient;Family member/caregiver    Family Member Consulted aide, Izora Gala           Patient will benefit from skilled therapeutic intervention in order to improve the following deficits and impairments:  Abnormal gait,Decreased activity tolerance,Decreased endurance,Decreased strength,Decreased balance,Decreased mobility,Difficulty walking  Visit Diagnosis: Other abnormalities of gait and mobility  Unsteadiness on feet     Problem List Patient Active Problem List   Diagnosis Date Noted  . Left rotator cuff tear 02/08/2018   Ricard Dillon PT, DPT   Zollie Pee 02/24/2021, 2:59 PM  [place] MAIN [facility] SERVICES [address] [place], [place], [address] Phone: [phone]   Fax:  [phone]  Name: Lyndy Russman MRN: [id number] Date of Birth: Aug 20, 1951

## 2021-03-01 ENCOUNTER — Ambulatory Visit: Payer: Medicare Other

## 2021-03-03 ENCOUNTER — Ambulatory Visit: Payer: Medicare Other

## 2021-03-03 ENCOUNTER — Other Ambulatory Visit: Payer: Self-pay

## 2021-03-03 DIAGNOSIS — R2689 Other abnormalities of gait and mobility: Secondary | ICD-10-CM

## 2021-03-03 DIAGNOSIS — M6281 Muscle weakness (generalized): Secondary | ICD-10-CM

## 2021-03-03 DIAGNOSIS — R2681 Unsteadiness on feet: Secondary | ICD-10-CM

## 2021-03-03 NOTE — Therapy (Signed)
[place] MAIN [facility] SERVICES [address] [place], [place], [address] Phone: [phone]   Fax:  [phone]  Physical Therapy Treatment  Patient Details  Name: Shannon Donovan MRN: [id number] Date of Birth: 02/11/1951 Referring Provider (PT): Jennings Books   Encounter Date: 03/03/2021   PT End of Session - 03/03/21 1154    Visit Number 21    Number of Visits 45    Date for PT Re-Evaluation 04/21/21    Authorization Type Medicare    Authorization Time Period 02/17/21- 03/31/21    PT Start Time 0930    PT Stop Time 1014    PT Time Calculation (min) 44 min    Equipment Utilized During Treatment Gait belt    Activity Tolerance Patient tolerated treatment well    Behavior During Therapy [facility] for tasks assessed/performed           Past Medical History:  Diagnosis Date  . Adolescent scoliosis   . Anxiety   . Atrial fibrillation ([place])   . Dysrhythmia    A fib  . History of chicken pox   . Hyperlipidemia   . Hyperplastic colon polyp 09/05/2016   Tubular adenoma of colon  . Hypertension    benign/ controlled  . Short-term memory loss   . Shoulder tendinitis    LEFT upper biceps tendonitis, rotator cuff tendinitis  . Stroke ([place]) 69/6789   embolic CVA/ short term memory loss    Past Surgical History:  Procedure Laterality Date  . BACK SURGERY     x2 at age 68 and 24 for scoliosis  . COLONOSCOPY N/A 09/05/2016   Procedure: COLONOSCOPY;  Surgeon: Lollie Sails, MD;  Location: [facility] ENDOSCOPY;  Service: Endoscopy;  Laterality: N/A;  Pt on Coumadin (PT/ INR)  . SHOULDER ARTHROSCOPY WITH OPEN ROTATOR CUFF REPAIR Left 02/08/2018   Procedure: SHOULDER ARTHROSCOPY WITH OPEN ROTATOR CUFF REPAIR;  Surgeon: Corky Mull, MD;  Location: [facility] ORS;  Service: Orthopedics;  Laterality: Left;  . SHOULDER CLOSED REDUCTION Left 05/09/2018   Procedure: CLOSED MANIPULATION SHOULDER  UNDER ANESTHESIA WITH STEROID INJECTION;  Surgeon: Corky Mull,  MD;  Location: [place];  Service: Orthopedics;  Laterality: Left;  . TONSILLECTOMY      There were no vitals filed for this visit.   Subjective Assessment - 03/03/21 0933    Subjective Pt reports no pain today. She says she walked around at the mall prior to PT.    Pertinent History 70 y.o. female presents to skilled physical therapy with a recent referral of imbalance. 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 She states she is very cautious and slow when she is ambulating in the community. She does poorly with ambulating in areas with a combination of high ceilings and big crowds such as the mall. She reports of having 1 fall in the house in the past 6 months. She states she was getting up from a surface and slipped and when she tried to catch the fall then her foot turned wrong.    Currently in Pain? No/denies           TREATMENT  NEURO: CGA assist throughout  Step ups onto 6" step forward/backward with decreasing level of UE support, with CGA. Pt unable to perform if not using some level of UE support.  Single leg marches with RLE and then with LLE - 2x15 each with decreasing levels of UE support. Pt able to eventually  perform without UE support.  Isolated toe-taps onto 6" step first with RLE and then LLE - 2x20 each with decreasing levels of UE support where pt was able to perform with just CGA from PT  Isolated LE step-up onto 6" step where trail leg performs heel raise or slight clearance from floor x multiple reps for each LE; pt performs without UE support and with CGA. Pt able to clear LLE as following-leg more than RLE  Ascending/descending stairs with CGA (both inside and outside), pt requires use of UUE support on rail or on PT arm x 2 rounds inside steps, 4x outside steps  Ambulation inside navigating incline/decline 4x, CGA  Ambulation outside on side walk, up/down hills, over grass, up/down curbs, over changing surfaces x several  minutes; Pt requires CGA throughout.   Education provided with VC/TC and demonstration for movement at target joints and to facilitate correct muscle activation with all exercises.   PT Short Term Goals - 02/24/21 1248      PT SHORT TERM GOAL #1   Title Patient will be independent in home exercise program to improve strength/mobility for better functional independence with ADLs.    Baseline 01/20/2021 Pt currently walking at home for exercise. Not fully indep with HEP (issued new handout). 4/6: indep with HEP. Doesnt rely on caregiver to assist.    Time 4    Period Weeks    Status Achieved    Target Date 02/17/21      PT SHORT TERM GOAL #2   Title Patient will reduce timed up and go to <11 seconds to reduce fall risk and demonstrate improved transfer/gait ability.    Baseline 02/07: 11.93s; 01/20/2021 11.81 seconds; 4/6: 10.39 sec    Time 4    Period Weeks    Status Achieved    Target Date 02/17/21             PT Long Term Goals - 02/24/21 0829      PT LONG TERM GOAL #1   Title Patient will increase FOTO score to equal to or greater than to 67 demonstrate statistically significant improvement in mobility and quality of life.    Baseline 02/07: 60; 01/20/2021 49; 4/6: Deferred due to internet connectivity issues. Will update next session. 4/13: 56    Time 8    Period Weeks    Status On-going    Target Date 04/21/21      PT LONG TERM GOAL #2   Title Patient (> 27 years old) will complete five times sit to stand test in < 15 seconds indicating an increased LE strength and improved balance.    Baseline 02/07: 34.13s; 01/20/2021 13.35 sec    Time 8    Period Weeks    Status Achieved      PT LONG TERM GOAL #3   Title Patient will increase Berg Balance score by > 6 points to demonstrate decreased fall risk during functional activities.    Baseline 02/07: 35/56; 01/20/2021 50/56    Time 8    Period Weeks    Status Achieved      PT LONG TERM GOAL #4   Title Patient will increase six  minute walk test distance to >1000 for progression to community ambulator and improve gait ability    Baseline 02/07: 725 ft, 01/15/21  1020 ft;    Time 8    Period Weeks    Status Achieved      PT LONG TERM GOAL #5   Title Patient  will increase 10 meter walk test to >1.45m/s as to improve gait speed for better community ambulation and to reduce fall risk.    Baseline 02/07: 0.84 m/s; 01/20/2021 1.08 m/s close CGA    Time 8    Period Weeks    Status Achieved      PT LONG TERM GOAL #6   Title Patient will increase dynamic gait index score to >19/24 as to demonstrate reduced fall risk and improved dynamic gait balance for better safety with community/home ambulation.    Baseline 01/20/2021 18/24; 4/6: 18/24. Stepping over box is big limiting factor from meeting goal; 4/13: 22/24    Time 6    Period Weeks    Status Achieved      PT LONG TERM GOAL #7   Title Pt will improve 6MWT to > or equal to 1700' for age norms to display safe ability to complete community ambulatn tasks without need for caregiver HHA support.    Baseline 4/6: 1,065'; 4/13: 1195 ft    Time 8    Period Weeks    Status On-going    Target Date 04/21/21      PT LONG TERM GOAL #8   Title Pt will improve ABC balance scale to > 67% to display clinicially significant decreased risk of falls with household ADL completion    Baseline 4/6: 51.25%; 4/13: 83.12%    Time 6    Period Weeks    Status Achieved            Plan - 03/03/21 1155    Clinical Impression Statement Pt most challeneged with navigating curbs/steps with decreasing levels of UE support. Pt unable to ascend/descend a step without at least UUE support and reports FOF. The pt was able to demonstrate one instance of completing a step-up without UE support. This was after the pt performed multiple reps of single leg march followed by single leg step-up with heel-raise of the trailing leg. The pt exhibited good balance with ambulating inclines/declines and over  changes in surfaces. She only had one instance of LOB with ambulating over grass, where pt was able to regain balance with step strategy and CGA from PT. The pt will benefit from further skilled therapy to improve BLE strength, gait, and balance to increase safety with all functional mobility.    Personal Factors and Comorbidities Age    Comorbidities L rotator cuff tear    Examination-Activity Limitations Bathing;Reach Overhead;Lift;Squat    Examination-Participation Restrictions Cleaning    Stability/Clinical Decision Making Stable/Uncomplicated    Rehab Potential Good    PT Frequency 1x / week    PT Duration 8 weeks    PT Treatment/Interventions Aquatic Therapy;Moist Heat;Gait training;Stair training;Functional mobility training;Therapeutic activities;Therapeutic exercise;Balance training;Neuromuscular re-education;Patient/family education;Manual lymph drainage;Passive range of motion;Dry needling    PT Next Visit Plan Amb. over curbs, stairs, hills; continue practicing steps/curbs    PT Home Exercise Plan No updates this session    Consulted and Agree with Plan of Care Patient;Family member/caregiver    Family Member Consulted aide, Izora Gala           Patient will benefit from skilled therapeutic intervention in order to improve the following deficits and impairments:  Abnormal gait,Decreased activity tolerance,Decreased endurance,Decreased strength,Decreased balance,Decreased mobility,Difficulty walking  Visit Diagnosis: Unsteadiness on feet  Other abnormalities of gait and mobility  Muscle weakness (generalized)  Problem List Patient Active Problem List   Diagnosis Date Noted  . Left rotator cuff tear 02/08/2018   Ricard Dillon PT,  DPT 03/03/2021, 12:11 PM  [place] MAIN [facility] SERVICES [address] [place], [place], [address] Phone: [phone]   Fax:  [phone]  Name: Neaveh Belanger MRN: [id number] Date of Birth: [birth year]

## 2021-03-08 ENCOUNTER — Ambulatory Visit: Payer: Medicare Other | Admitting: Physical Therapy

## 2021-03-10 ENCOUNTER — Other Ambulatory Visit: Payer: Self-pay

## 2021-03-10 ENCOUNTER — Ambulatory Visit: Payer: Medicare Other

## 2021-03-10 DIAGNOSIS — M6281 Muscle weakness (generalized): Secondary | ICD-10-CM

## 2021-03-10 DIAGNOSIS — R262 Difficulty in walking, not elsewhere classified: Secondary | ICD-10-CM

## 2021-03-10 DIAGNOSIS — R278 Other lack of coordination: Secondary | ICD-10-CM

## 2021-03-10 DIAGNOSIS — R269 Unspecified abnormalities of gait and mobility: Secondary | ICD-10-CM

## 2021-03-10 DIAGNOSIS — R2681 Unsteadiness on feet: Secondary | ICD-10-CM | POA: Diagnosis not present

## 2021-03-10 NOTE — Therapy (Signed)
[place] MAIN [facility] SERVICES [address] [place], [place], [address] Phone: [phone]   Fax:  [phone]  Physical Therapy Treatment  Patient Details  Name: Shannon Donovan MRN: [id number] Date of Birth: 1951-09-01 Referring Provider (PT): Jennings Books   Encounter Date: 03/10/2021   PT End of Session - 03/10/21 0934    Visit Number 22    Number of Visits 45    Date for PT Re-Evaluation 04/21/21    Authorization Type Medicare    Authorization Time Period 02/17/21- 03/31/21    PT Start Time 0930    PT Stop Time 1012    PT Time Calculation (min) 42 min    Equipment Utilized During Treatment Gait belt    Activity Tolerance Patient tolerated treatment well    Behavior During Therapy [facility] for tasks assessed/performed           Past Medical History:  Diagnosis Date  . Adolescent scoliosis   . Anxiety   . Atrial fibrillation ([place])   . Dysrhythmia    A fib  . History of chicken pox   . Hyperlipidemia   . Hyperplastic colon polyp 09/05/2016   Tubular adenoma of colon  . Hypertension    benign/ controlled  . Short-term memory loss   . Shoulder tendinitis    LEFT upper biceps tendonitis, rotator cuff tendinitis  . Stroke ([place]) [id number]   embolic CVA/ short term memory loss    Past Surgical History:  Procedure Laterality Date  . BACK SURGERY     x2 at age 29 and 48 for scoliosis  . COLONOSCOPY N/A 09/05/2016   Procedure: COLONOSCOPY;  Surgeon: Lollie Sails, MD;  Location: [facility] ENDOSCOPY;  Service: Endoscopy;  Laterality: N/A;  Pt on Coumadin (PT/ INR)  . SHOULDER ARTHROSCOPY WITH OPEN ROTATOR CUFF REPAIR Left 02/08/2018   Procedure: SHOULDER ARTHROSCOPY WITH OPEN ROTATOR CUFF REPAIR;  Surgeon: Corky Mull, MD;  Location: [facility] ORS;  Service: Orthopedics;  Laterality: Left;  . SHOULDER CLOSED REDUCTION Left 05/09/2018   Procedure: CLOSED MANIPULATION SHOULDER  UNDER ANESTHESIA WITH STEROID INJECTION;  Surgeon: Corky Mull,  MD;  Location: [place];  Service: Orthopedics;  Laterality: Left;  . TONSILLECTOMY      There were no vitals filed for this visit.   Subjective Assessment - 03/10/21 0932    Subjective Pt reports some soreness in left arm but otherwise doing well.    Pertinent History 70 y.o. female presents to skilled physical therapy with a recent referral of imbalance. 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 She states she is very cautious and slow when she is ambulating in the community. She does poorly with ambulating in areas with a combination of high ceilings and big crowds such as the mall. She reports of having 1 fall in the house in the past 6 months. She states she was getting up from a surface and slipped and when she tried to catch the fall then her foot turned wrong.    Currently in Pain? Yes    Pain Location Shoulder    Pain Descriptors / Indicators Aching    Pain Type Chronic pain    Pain Onset More than a month ago    Pain Frequency Intermittent             Interventions:  Step up onto 1 in wood block x 10 reps (1st 3 with 1UE support followed by 7 without UE  support)- CGA from PT- Initially fearful yet improved with practice.   Step up onto green theraband pad x 10 reps (1st 3 with 1UE support and then remaining 7 without UE support- hand just hovering over bar) - CGA from PT   Step up onto purple pad x 10 reps(1st 3 with 1 UE support and then remaining 7 without UE support) - Patient with decreased confidence requiring support verbal cueing. No issues clearing foot for step height.   Step up onto 5 in step x 15 reps  (1st 5 reps with 1 UE support then 10 more reps without UE support)   Gait inside of hospital including <10 steps using right railing (patient declined to go up next flight of steps claiming that would be too much for her - so performed descend with CGA and then walked toward elevators and patient performed gait without an AD  throughout lower level and 1st level on incline, in crowded open areas, with no loss of balance with CGA throughout ambulation today.  Education provided throughout session via VC/TC and demonstration to facilitate movement at target joints and correct muscle activation for all testing and exercises performed.  Clinical Impression: Patient performed well with steps with increased encouragement. Patient lacking confidence and increased fear with increasing step height but performed well with practice and encouragement today. She was able to perform the 6 in step without UE support today with practice.  The pt will benefit from further skilled therapy to improve BLE strength, gait, and balance to increase safety with all functional mobility. --                         PT Education - 03/10/21 1703    Education Details Safet with mobility, Step education    Person(s) Educated Patient    Methods Explanation;Demonstration;Tactile cues;Verbal cues    Comprehension Verbalized understanding;Returned demonstration;Need further instruction;Tactile cues required;Verbal cues required            PT Short Term Goals - 02/24/21 1248      PT SHORT TERM GOAL #1   Title Patient will be independent in home exercise program to improve strength/mobility for better functional independence with ADLs.    Baseline 01/20/2021 Pt currently walking at home for exercise. Not fully indep with HEP (issued new handout). 4/6: indep with HEP. Doesnt rely on caregiver to assist.    Time 4    Period Weeks    Status Achieved    Target Date 02/17/21      PT SHORT TERM GOAL #2   Title Patient will reduce timed up and go to <11 seconds to reduce fall risk and demonstrate improved transfer/gait ability.    Baseline 02/07: 11.93s; 01/20/2021 11.81 seconds; 4/6: 10.39 sec    Time 4    Period Weeks    Status Achieved    Target Date 02/17/21             PT Long Term Goals - 02/24/21 0829      PT LONG  TERM GOAL #1   Title Patient will increase FOTO score to equal to or greater than to 67 demonstrate statistically significant improvement in mobility and quality of life.    Baseline 02/07: 60; 01/20/2021 49; 4/6: Deferred due to internet connectivity issues. Will update next session. 4/13: 56    Time 8    Period Weeks    Status On-going    Target Date 04/21/21  PT LONG TERM GOAL #2   Title Patient (> 13 years old) will complete five times sit to stand test in < 15 seconds indicating an increased LE strength and improved balance.    Baseline 02/07: 34.13s; 01/20/2021 13.35 sec    Time 8    Period Weeks    Status Achieved      PT LONG TERM GOAL #3   Title Patient will increase Berg Balance score by > 6 points to demonstrate decreased fall risk during functional activities.    Baseline 02/07: 35/56; 01/20/2021 50/56    Time 8    Period Weeks    Status Achieved      PT LONG TERM GOAL #4   Title Patient will increase six minute walk test distance to >1000 for progression to community ambulator and improve gait ability    Baseline 02/07: 725 ft, 01/15/21  1020 ft;    Time 8    Period Weeks    Status Achieved      PT LONG TERM GOAL #5   Title Patient will increase 10 meter walk test to >1.82m/s as to improve gait speed for better community ambulation and to reduce fall risk.    Baseline 02/07: 0.84 m/s; 01/20/2021 1.08 m/s close CGA    Time 8    Period Weeks    Status Achieved      PT LONG TERM GOAL #6   Title Patient will increase dynamic gait index score to >19/24 as to demonstrate reduced fall risk and improved dynamic gait balance for better safety with community/home ambulation.    Baseline 01/20/2021 18/24; 4/6: 18/24. Stepping over box is big limiting factor from meeting goal; 4/13: 22/24    Time 6    Period Weeks    Status Achieved      PT LONG TERM GOAL #7   Title Pt will improve 6MWT to > or equal to 1700' for age norms to display safe ability to complete community ambulatn  tasks without need for caregiver HHA support.    Baseline 4/6: 1,065'; 4/13: 1195 ft    Time 8    Period Weeks    Status On-going    Target Date 04/21/21      PT LONG TERM GOAL #8   Title Pt will improve ABC balance scale to > 67% to display clinicially significant decreased risk of falls with household ADL completion    Baseline 4/6: 51.25%; 4/13: 83.12%    Time 6    Period Weeks    Status Achieved                 Plan - 03/10/21 0935    Clinical Impression Statement Patient performed well with steps with increased encouragement. Patient lacking confidence and increased fear with increasing step height but performed well with practice and encouragement today. She was able to perform the 6 in step without UE support today with practice.   The pt will benefit from further skilled therapy to improve BLE strength, gait, and balance to increase safety with all functional mobility. --    Personal Factors and Comorbidities Age    Comorbidities L rotator cuff tear    Examination-Activity Limitations Bathing;Reach Overhead;Lift;Squat    Examination-Participation Restrictions Cleaning    Stability/Clinical Decision Making Stable/Uncomplicated    Rehab Potential Good    PT Frequency 1x / week    PT Duration 8 weeks    PT Treatment/Interventions Aquatic Therapy;Moist Heat;Gait training;Stair training;Functional mobility training;Therapeutic activities;Therapeutic exercise;Balance training;Neuromuscular re-education;Patient/family  education;Manual lymph drainage;Passive range of motion;Dry needling    PT Next Visit Plan Amb. over curbs, stairs, hills; continue practicing steps/curbs    PT Home Exercise Plan No updates this session    Consulted and Agree with Plan of Care Patient;Family member/caregiver    Family Member Consulted aide, Izora Gala           Patient will benefit from skilled therapeutic intervention in order to improve the following deficits and impairments:  Abnormal  gait,Decreased activity tolerance,Decreased endurance,Decreased strength,Decreased balance,Decreased mobility,Difficulty walking  Visit Diagnosis: Abnormality of gait and mobility  Difficulty in walking, not elsewhere classified  Muscle weakness (generalized)  Other lack of coordination     Problem List Patient Active Problem List   Diagnosis Date Noted  . Left rotator cuff tear 02/08/2018    Shannon Donovan, PT 03/10/2021, 5:20 PM  [place] MAIN [facility] SERVICES [address] [place], [place], [address] Phone: [phone]   Fax:  [phone]  Name: Shannon Donovan MRN: [id number] Date of Birth: 1950-11-15

## 2021-03-15 ENCOUNTER — Ambulatory Visit: Payer: Medicare Other

## 2021-03-17 ENCOUNTER — Ambulatory Visit: Payer: Medicare Other | Attending: Neurology

## 2021-03-17 ENCOUNTER — Other Ambulatory Visit: Payer: Self-pay

## 2021-03-17 DIAGNOSIS — R2689 Other abnormalities of gait and mobility: Secondary | ICD-10-CM | POA: Diagnosis present

## 2021-03-17 DIAGNOSIS — M6281 Muscle weakness (generalized): Secondary | ICD-10-CM | POA: Diagnosis present

## 2021-03-17 DIAGNOSIS — R2681 Unsteadiness on feet: Secondary | ICD-10-CM | POA: Diagnosis present

## 2021-03-17 NOTE — Therapy (Signed)
[place] MAIN [facility] SERVICES [address] [place], [place], [address] Phone: [phone]   Fax:  [phone]  Physical Therapy Treatment  Patient Details  Name: Shannon Donovan MRN: [id number] Date of Birth: November 29, 1950 Referring Provider (PT): Jennings Books   Encounter Date: 03/17/2021   PT End of Session - 03/17/21 1153    Visit Number 23    Number of Visits 45    Date for PT Re-Evaluation 04/21/21    Authorization Type Medicare    Authorization Time Period 02/17/21- 03/31/21    PT Start Time 0932    PT Stop Time 1014    PT Time Calculation (min) 42 min    Equipment Utilized During Treatment Gait belt    Activity Tolerance Patient tolerated treatment well    Behavior During Therapy [facility] for tasks assessed/performed           Past Medical History:  Diagnosis Date  . Adolescent scoliosis   . Anxiety   . Atrial fibrillation ([place])   . Dysrhythmia    A fib  . History of chicken pox   . Hyperlipidemia   . Hyperplastic colon polyp 09/05/2016   Tubular adenoma of colon  . Hypertension    benign/ controlled  . Short-term memory loss   . Shoulder tendinitis    LEFT upper biceps tendonitis, rotator cuff tendinitis  . 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 BACK SURGERY     x2 at age 24 and 52 for scoliosis  . COLONOSCOPY N/A 09/05/2016   Procedure: COLONOSCOPY;  Surgeon: Lollie Sails, MD;  Location: [facility] ENDOSCOPY;  Service: Endoscopy;  Laterality: N/A;  Pt on Coumadin (PT/ INR)  . SHOULDER ARTHROSCOPY WITH OPEN ROTATOR CUFF REPAIR Left 02/08/2018   Procedure: SHOULDER ARTHROSCOPY WITH OPEN ROTATOR CUFF REPAIR;  Surgeon: Corky Mull, MD;  Location: [facility] ORS;  Service: Orthopedics;  Laterality: Left;  . SHOULDER CLOSED REDUCTION Left 05/09/2018   Procedure: CLOSED MANIPULATION SHOULDER  UNDER ANESTHESIA WITH STEROID INJECTION;  Surgeon: Corky Mull,  MD;  Location: [place];  Service: Orthopedics;  Laterality: Left;  . TONSILLECTOMY      There were no vitals filed for this visit.  TREATMENT:   NEURO RE-ED  At stairs: close CGA for all Alternating step-ups onto stair - x multiple reps with decreasing BUE support to 3-finger support. Frequent cues provided. Pt reports FOF. No loss of balance.  Isolated LE heel tap onto stair step - 3x15 each LE. Demo/VC for technique. No UE support with a lot of encouragement.   Split stance: one LE on stair step, one LE on ground. Pt pushes through LE on step and performs heel raise with foot on ground.  Purpose of exercise to prepare pt to make full step-up without UE support. Multiple reps performed on each LE.  Split stance (one LE on step one on ground) rocks - 3x15 each LE.  Split stance (one LE on step on on ground) with push-off of LE on ground 20x each LE.  At support bar: SLB 6x30 sec each LE; intermittent UE support and toe-touch required  Step-up and off of airex pad with no UE support, CGA 2x10 each LE as leading LE. Pt instructed to clasp her hands so she does not use UE support.   Hedgehog heel taps 3x20 each LE   Standing cone taps, PT calling out  which color cone to tap -  x multiple reps each LE. No UE support.    Education provided with VC/TC and demonstration for movement at target joints and to facilitate correct muscle activation with all exercises.  Access Code: [email or web address] URL: [email or web address] Date: 03/17/2021 Prepared by: Ricard Dillon  Exercises Single Leg Stance with Support - 1 x daily - 7 x weekly - 2 sets - 2 reps for 30 sec each. Pt instructed to perform at support surface so she can hold on if needed (use of UE support), and with chair behind her.       PT Short Term Goals - 02/24/21 1248      PT SHORT TERM GOAL #1   Title Patient will be independent in home exercise program to improve strength/mobility for better functional  independence with ADLs.    Baseline 01/20/2021 Pt currently walking at home for exercise. Not fully indep with HEP (issued new handout). 4/6: indep with HEP. Doesnt rely on caregiver to assist.    Time 4    Period Weeks    Status Achieved    Target Date 02/17/21      PT SHORT TERM GOAL #2   Title Patient will reduce timed up and go to <11 seconds to reduce fall risk and demonstrate improved transfer/gait ability.    Baseline 02/07: 11.93s; 01/20/2021 11.81 seconds; 4/6: 10.39 sec    Time 4    Period Weeks    Status Achieved    Target Date 02/17/21             PT Long Term Goals - 02/24/21 0829      PT LONG TERM GOAL #1   Title Patient will increase FOTO score to equal to or greater than to 67 demonstrate statistically significant improvement in mobility and quality of life.    Baseline 02/07: 60; 01/20/2021 49; 4/6: Deferred due to internet connectivity issues. Will update next session. 4/13: 56    Time 8    Period Weeks    Status On-going    Target Date 04/21/21      PT LONG TERM GOAL #2   Title Patient (> 7 years old) will complete five times sit to stand test in < 15 seconds indicating an increased LE strength and improved balance.    Baseline 02/07: 34.13s; 01/20/2021 13.35 sec    Time 8    Period Weeks    Status Achieved      PT LONG TERM GOAL #3   Title Patient will increase Berg Balance score by > 6 points to demonstrate decreased fall risk during functional activities.    Baseline 02/07: 35/56; 01/20/2021 50/56    Time 8    Period Weeks    Status Achieved      PT LONG TERM GOAL #4   Title Patient will increase six minute walk test distance to >1000 for progression to community ambulator and improve gait ability    Baseline 02/07: 725 ft, 01/15/21  1020 ft;    Time 8    Period Weeks    Status Achieved      PT LONG TERM GOAL #5   Title Patient will increase 10 meter walk test to >1.15m/s as to improve gait speed for better community ambulation and to reduce fall risk.     Baseline 02/07: 0.84 m/s; 01/20/2021 1.08 m/s close CGA    Time 8    Period Weeks    Status Achieved      PT LONG  TERM GOAL #6   Title Patient will increase dynamic gait index score to >19/24 as to demonstrate reduced fall risk and improved dynamic gait balance for better safety with community/home ambulation.    Baseline 01/20/2021 18/24; 4/6: 18/24. Stepping over box is big limiting factor from meeting goal; 4/13: 22/24    Time 6    Period Weeks    Status Achieved      PT LONG TERM GOAL #7   Title Pt will improve 6MWT to > or equal to 1700' for age norms to display safe ability to complete community ambulatn tasks without need for caregiver HHA support.    Baseline 4/6: 1,065'; 4/13: 1195 ft    Time 8    Period Weeks    Status On-going    Target Date 04/21/21      PT LONG TERM GOAL #8   Title Pt will improve ABC balance scale to > 67% to display clinicially significant decreased risk of falls with household ADL completion    Baseline 4/6: 51.25%; 4/13: 83.12%    Time 6    Period Weeks    Status Achieved                 Plan - 03/17/21 1159    Clinical Impression Statement Focused session on pt performing SLB related tasks (toe taps, split stance with push off, etc). Pt motivated to participate but requires a lot of encouragement to perform exercises due to FOF. Pt without LOB with all exercises. She does exhibit less control/ability to maintain single leg stance with RLE as stance LE during hedgehog heel taps. PT added SLB to pt HEP with counter support and chair behind her. Instructed both pt and caregier in exercise. Both verbalized understanding and pt returned demo. The pt will benefit from further skilled therapy to increase LE strength and to improve balance and gait mechanics to increase safety with all functional mobility.    Personal Factors and Comorbidities Age    Comorbidities L rotator cuff tear    Examination-Activity Limitations Bathing;Reach  Overhead;Lift;Squat    Examination-Participation Restrictions Cleaning    Stability/Clinical Decision Making Stable/Uncomplicated    Rehab Potential Good    PT Frequency 1x / week    PT Duration 8 weeks    PT Treatment/Interventions Aquatic Therapy;Moist Heat;Gait training;Stair training;Functional mobility training;Therapeutic activities;Therapeutic exercise;Balance training;Neuromuscular re-education;Patient/family education;Manual lymph drainage;Passive range of motion;Dry needling    PT Next Visit Plan Amb. over curbs, stairs, hills; continue practicing steps/curbs, SLB    PT Home Exercise Plan update: Access Code: [email or web address]    Consulted and Agree with Plan of Care Patient;Family member/caregiver    Family Member Consulted aide, Izora Gala           Patient will benefit from skilled therapeutic intervention in order to improve the following deficits and impairments:  Abnormal gait,Decreased activity tolerance,Decreased endurance,Decreased strength,Decreased balance,Decreased mobility,Difficulty walking  Visit Diagnosis: Unsteadiness on feet  Other abnormalities of gait and mobility  Muscle weakness (generalized)     Problem List Patient Active Problem List   Diagnosis Date Noted  . Left rotator cuff tear 02/08/2018   Ricard Dillon PT, DPT 03/17/2021, 12:04 PM  [place] MAIN [facility] SERVICES [address] [place], [place], [address] Phone: [phone]   Fax:  [phone]  Name: Shannon Donovan MRN: [id number] Date of Birth: 1951-02-04

## 2021-03-22 ENCOUNTER — Ambulatory Visit: Payer: Medicare Other

## 2021-03-24 ENCOUNTER — Ambulatory Visit: Payer: Medicare Other

## 2021-03-24 ENCOUNTER — Other Ambulatory Visit: Payer: Self-pay

## 2021-03-24 DIAGNOSIS — R2689 Other abnormalities of gait and mobility: Secondary | ICD-10-CM

## 2021-03-24 DIAGNOSIS — R2681 Unsteadiness on feet: Secondary | ICD-10-CM

## 2021-03-24 NOTE — Therapy (Signed)
[place] MAIN [facility] SERVICES [address] [place], [place], [address] Phone: [phone]   Fax:  [phone]  Physical Therapy Treatment  Patient Details  Name: Shannon Donovan MRN: [id number] Date of Birth: [birth year] Referring Provider (PT): Jennings Books   Encounter Date: 03/24/2021   PT End of Session - 03/24/21 1148    Visit Number 24    Number of Visits 45    Date for PT Re-Evaluation 04/21/21    Authorization Type Medicare    Authorization Time Period 02/17/21- 03/31/21    PT Start Time 0937    PT Stop Time 1017    PT Time Calculation (min) 40 min    Equipment Utilized During Treatment Gait belt    Activity Tolerance Patient tolerated treatment well    Behavior During Therapy [facility] for tasks assessed/performed           Past Medical History:  Diagnosis Date  . Adolescent scoliosis   . Anxiety   . Atrial fibrillation ([place])   . Dysrhythmia    A fib  . History of chicken pox   . Hyperlipidemia   . Hyperplastic colon polyp 09/05/2016   Tubular adenoma of colon  . Hypertension    benign/ controlled  . Short-term memory loss   . Shoulder tendinitis    LEFT upper biceps tendonitis, rotator cuff tendinitis  . Stroke ([place]) 27/0350   embolic CVA/ short term memory loss    Past Surgical History:  Procedure Laterality Date  . BACK SURGERY     x2 at age 71 and 73 for scoliosis  . COLONOSCOPY N/A 09/05/2016   Procedure: COLONOSCOPY;  Surgeon: Lollie Sails, MD;  Location: [facility] ENDOSCOPY;  Service: Endoscopy;  Laterality: N/A;  Pt on Coumadin (PT/ INR)  . SHOULDER ARTHROSCOPY WITH OPEN ROTATOR CUFF REPAIR Left 02/08/2018   Procedure: SHOULDER ARTHROSCOPY WITH OPEN ROTATOR CUFF REPAIR;  Surgeon: Corky Mull, MD;  Location: [facility] ORS;  Service: Orthopedics;  Laterality: Left;  . SHOULDER CLOSED REDUCTION Left 05/09/2018   Procedure: CLOSED MANIPULATION SHOULDER  UNDER ANESTHESIA WITH STEROID INJECTION;  Surgeon: Corky Mull,  MD;  Location: [place];  Service: Orthopedics;  Laterality: Left;  . TONSILLECTOMY      There were no vitals filed for this visit.   Subjective Assessment - 03/24/21 0938    Subjective Pt denies falls or near-falls. Pt reports L shoulder is doing OK. [place]    Pertinent History 70 y.o. female presents to skilled physical therapy with a recent referral of imbalance. 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 She states she is very cautious and slow when she is ambulating in the community. She does poorly with ambulating in areas with a combination of high ceilings and big crowds such as the mall. She reports of having 1 fall in the house in the past 6 months. She states she was getting up from a surface and slipped and when she tried to catch the fall then her foot turned wrong.    Currently in Pain? Yes    Pain Location Shoulder    Pain Orientation Left    Pain Onset More than a month ago           TREATMENT-   Neuro Re-Education: CGA provided for the following   Ascending/descending 4 stairs, with BUE support to finger-touch support with ascending and BUE support to UUE support descending-  X multiple reps.    At support bar: Step forward and backward off of 6" block without UE support (pt holding physioball in UE) -  x multiple reps, CGA; Pt at first hesitant, reports FOF, grabs onto bar, but later able to perform multiple reps without UE support. No LOB.  SLB - 4x30 sec each LE; pt with greater difficulty maintaining balance on RLE. She uses intermittent UE support.   Standing cone taps: forward/contralat (3 cones). For multiple reps with each LE. Greater difficulty with RLE as support leg.  Standing on bosu (flat surface) CW/CC, back and forth, side-to-side 10x for each.  Bosu, standing on half-dome, feet-together - 2x30 sec, heel rocks with feet together - 10x. Intermittent UE support.  Education provided with VC/TC  and demonstration for movement at target joints and to facilitate correct muscle activation with all exercises. Pt exhibits fair carryover within session after cuing. Slightly limited in technique by FOF.      PT Short Term Goals - 02/24/21 1248      PT SHORT TERM GOAL #1   Title Patient will be independent in home exercise program to improve strength/mobility for better functional independence with ADLs.    Baseline 01/20/2021 Pt currently walking at home for exercise. Not fully indep with HEP (issued new handout). 4/6: indep with HEP. Doesnt rely on caregiver to assist.    Time 4    Period Weeks    Status Achieved    Target Date 02/17/21      PT SHORT TERM GOAL #2   Title Patient will reduce timed up and go to <11 seconds to reduce fall risk and demonstrate improved transfer/gait ability.    Baseline 02/07: 11.93s; 01/20/2021 11.81 seconds; 4/6: 10.39 sec    Time 4    Period Weeks    Status Achieved    Target Date 02/17/21             PT Long Term Goals - 02/24/21 0829      PT LONG TERM GOAL #1   Title Patient will increase FOTO score to equal to or greater than to 67 demonstrate statistically significant improvement in mobility and quality of life.    Baseline 02/07: 60; 01/20/2021 49; 4/6: Deferred due to internet connectivity issues. Will update next session. 4/13: 56    Time 8    Period Weeks    Status On-going    Target Date 04/21/21      PT LONG TERM GOAL #2   Title Patient (> 40 years old) will complete five times sit to stand test in < 15 seconds indicating an increased LE strength and improved balance.    Baseline 02/07: 34.13s; 01/20/2021 13.35 sec    Time 8    Period Weeks    Status Achieved      PT LONG TERM GOAL #3   Title Patient will increase Berg Balance score by > 6 points to demonstrate decreased fall risk during functional activities.    Baseline 02/07: 35/56; 01/20/2021 50/56    Time 8    Period Weeks    Status Achieved      PT LONG TERM GOAL #4    Title Patient will increase six minute walk test distance to >1000 for progression to community ambulator and improve gait ability    Baseline 02/07: 725 ft, 01/15/21  1020 ft;    Time 8    Period Weeks    Status Achieved      PT LONG TERM  GOAL #5   Title Patient will increase 10 meter walk test to >1.39m/s as to improve gait speed for better community ambulation and to reduce fall risk.    Baseline 02/07: 0.84 m/s; 01/20/2021 1.08 m/s close CGA    Time 8    Period Weeks    Status Achieved      PT LONG TERM GOAL #6   Title Patient will increase dynamic gait index score to >19/24 as to demonstrate reduced fall risk and improved dynamic gait balance for better safety with community/home ambulation.    Baseline 01/20/2021 18/24; 4/6: 18/24. Stepping over box is big limiting factor from meeting goal; 4/13: 22/24    Time 6    Period Weeks    Status Achieved      PT LONG TERM GOAL #7   Title Pt will improve 6MWT to > or equal to 1700' for age norms to display safe ability to complete community ambulatn tasks without need for caregiver HHA support.    Baseline 4/6: 1,065'; 4/13: 1195 ft    Time 8    Period Weeks    Status On-going    Target Date 04/21/21      PT LONG TERM GOAL #8   Title Pt will improve ABC balance scale to > 67% to display clinicially significant decreased risk of falls with household ADL completion    Baseline 4/6: 51.25%; 4/13: 83.12%    Time 6    Period Weeks    Status Achieved                 Plan - 03/24/21 1149    Clinical Impression Statement Pt still with difficulty with step-ups/downs from stair step or 6" step. She is limited by FOF and uses UE support initially to perform step-up exercise. When pt held physioball in hand and encouraged by PT she was then able to complete exercise without UE support for multiple reps with no LOB. The pt will continue to benefit from further skilled therapy to improve balance and balance confidence to increase ease and safety  with all functional mobility.    Personal Factors and Comorbidities Age    Comorbidities L rotator cuff tear    Examination-Activity Limitations Bathing;Reach Overhead;Lift;Squat    Examination-Participation Restrictions Cleaning    Stability/Clinical Decision Making Stable/Uncomplicated    Rehab Potential Good    PT Frequency 1x / week    PT Duration 8 weeks    PT Treatment/Interventions Aquatic Therapy;Moist Heat;Gait training;Stair training;Functional mobility training;Therapeutic activities;Therapeutic exercise;Balance training;Neuromuscular re-education;Patient/family education;Manual lymph drainage;Passive range of motion;Dry needling    PT Next Visit Plan Amb. over curbs, stairs, hills; continue practicing steps/curbs, SLB, continue POC as previously indicated    PT Home Exercise Plan update: Access Code: [email or web address]    Consulted and Agree with Plan of Care Patient;Family member/caregiver    Family Member Consulted aide, Izora Gala           Patient will benefit from skilled therapeutic intervention in order to improve the following deficits and impairments:  Abnormal gait,Decreased activity tolerance,Decreased endurance,Decreased strength,Decreased balance,Decreased mobility,Difficulty walking  Visit Diagnosis: Unsteadiness on feet  Other abnormalities of gait and mobility     Problem List Patient Active Problem List   Diagnosis Date Noted  . Left rotator cuff tear 02/08/2018   Ricard Dillon PT, DPT 03/24/2021, 12:04 PM  [place] MAIN [facility] SERVICES [address] [place], [place], [address] Phone: [phone]   Fax:  [phone]  Name: Shannon  Hiromi Donovan MRN: [id number] Date of Birth: November 07, 1951

## 2021-03-29 ENCOUNTER — Ambulatory Visit: Payer: Medicare Other

## 2021-03-31 ENCOUNTER — Ambulatory Visit: Payer: Medicare Other

## 2021-03-31 ENCOUNTER — Other Ambulatory Visit: Payer: Self-pay

## 2021-03-31 DIAGNOSIS — R2681 Unsteadiness on feet: Secondary | ICD-10-CM

## 2021-03-31 DIAGNOSIS — R2689 Other abnormalities of gait and mobility: Secondary | ICD-10-CM

## 2021-03-31 DIAGNOSIS — M6281 Muscle weakness (generalized): Secondary | ICD-10-CM

## 2021-03-31 NOTE — Therapy (Signed)
[place] MAIN [facility] SERVICES [address] [place], [place], [address] Phone: [phone]   Fax:  [phone]  Physical Therapy Treatment  Patient Details  Name: Shannon Donovan MRN: [id number] Date of Birth: 1951/05/01 Referring Provider (PT): Jennings Books   Encounter Date: 03/31/2021   PT End of Session - 03/31/21 1709    Visit Number 25    Number of Visits 45    Date for PT Re-Evaluation 04/21/21    Authorization Type Medicare    Authorization Time Period 02/17/21- 03/31/21    PT Start Time 0936    PT Stop Time 1015    PT Time Calculation (min) 39 min    Equipment Utilized During Treatment Gait belt    Activity Tolerance Patient tolerated treatment well    Behavior During Therapy [facility] for tasks assessed/performed           Past Medical History:  Diagnosis Date  . Adolescent scoliosis   . Anxiety   . Atrial fibrillation ([place])   . Dysrhythmia    A fib  . History of chicken pox   . Hyperlipidemia   . Hyperplastic colon polyp 09/05/2016   Tubular adenoma of colon  . Hypertension    benign/ controlled  . Short-term memory loss   . Shoulder tendinitis    LEFT upper biceps tendonitis, rotator cuff tendinitis  . 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 BACK SURGERY     x2 at age 50 and 26 for scoliosis  . COLONOSCOPY N/A 09/05/2016   Procedure: COLONOSCOPY;  Surgeon: Lollie Sails, MD;  Location: [facility] ENDOSCOPY;  Service: Endoscopy;  Laterality: N/A;  Pt on Coumadin (PT/ INR)  . SHOULDER ARTHROSCOPY WITH OPEN ROTATOR CUFF REPAIR Left 02/08/2018   Procedure: SHOULDER ARTHROSCOPY WITH OPEN ROTATOR CUFF REPAIR;  Surgeon: Corky Mull, MD;  Location: [facility] ORS;  Service: Orthopedics;  Laterality: Left;  . SHOULDER CLOSED REDUCTION Left 05/09/2018   Procedure: CLOSED MANIPULATION SHOULDER  UNDER ANESTHESIA WITH STEROID INJECTION;  Surgeon: Corky Mull,  MD;  Location: [place];  Service: Orthopedics;  Laterality: Left;  . TONSILLECTOMY      There were no vitals filed for this visit.   Subjective Assessment - 03/31/21 1707    Subjective Pt reports no major changes since previous session. She says she feels fairly independent with therapy exercises.    Pertinent History 70 y.o. female presents to skilled physical therapy with a recent referral of imbalance. 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 She states she is very cautious and slow when she is ambulating in the community. She does poorly with ambulating in areas with a combination of high ceilings and big crowds such as the mall. She reports of having 1 fall in the house in the past 6 months. She states she was getting up from a surface and slipped and when she tried to catch the fall then her foot turned wrong.    Currently in Pain? Yes    Pain Location Elbow    Pain Onset More than a month ago            TREATMENT:   HEP: Pt reports she tries to do HEP every day. Pt reports she refers to list.  FOTO: 58  6MWT: 1060 ft, not achieved, slight decrease from last time  STS - 2x25  5xSTS - 13.39 sec  Reviewed the following: Pt performed 2x30 sec BLEs for each balance exercise and 10 reps of each therex below unless otherwise stated above.    Access Code: [email or web address] URL: [email or web address] Date: 03/31/2021 Prepared by: Ricard Dillon  Exercises Sit to Stand - 1 x daily - 4 x weekly - 3 sets - 10 reps Standing Hip Abduction with Counter Support - 1 x daily - 4 x weekly - 3 sets - 10 reps Single Leg Stance with Support - 1 x daily - 7 x weekly - 2 sets - 2 reps - 30 hold Standing Tandem Balance with Counter Support - 1 x daily - 7 x weekly - 2 sets - 2 reps - 30 hold Sitting Knee Extension with Resistance - 1 x daily - 4 x weekly - 3 sets - 10 reps    Education provided with VC/TC and demonstration for movement at target joints  and to facilitate correct muscle activation with all exercises. Pt exhibits goodcarryover within session after cuing.       PT Education - 03/31/21 1709    Education Details HEP (see note)    Person(s) Educated Patient;Caregiver(s)    Methods Explanation;Demonstration;Verbal cues;Tactile cues    Comprehension Verbalized understanding;Returned demonstration            PT Short Term Goals - 03/31/21 1713      PT SHORT TERM GOAL #1   Title Patient will be independent in home exercise program to improve strength/mobility for better functional independence with ADLs.    Baseline 01/20/2021 Pt currently walking at home for exercise. Not fully indep with HEP (issued new handout). 4/6: indep with HEP. Doesnt rely on caregiver to assist; 5/18: pt performing HEP almost every day.    Time 4    Period Weeks    Status Achieved    Target Date 02/17/21      PT SHORT TERM GOAL #2   Title Patient will reduce timed up and go to <11 seconds to reduce fall risk and demonstrate improved transfer/gait ability.    Baseline 02/07: 11.93s; 01/20/2021 11.81 seconds; 4/6: 10.39 sec    Time 4    Period Weeks    Status Achieved    Target Date 02/17/21             PT Long Term Goals - 03/31/21 1714      PT LONG TERM GOAL #1   Title Patient will increase FOTO score to equal to or greater than to 67 demonstrate statistically significant improvement in mobility and quality of life.    Baseline 02/07: 60; 01/20/2021 49; 4/6: Deferred due to internet connectivity issues. Will update next session. 4/13: 56; 5/18: 58    Time 8    Period Weeks    Status On-going    Target Date 04/21/21      PT LONG TERM GOAL #2   Title Patient (> 68 years old) will complete five times sit to stand test in < 15 seconds indicating an increased LE strength and improved balance.    Baseline 02/07: 34.13s; 01/20/2021 13.35 sec; 5/18 13.39 sec    Time 8    Period Weeks    Status Achieved      PT LONG TERM GOAL #3   Title Patient  will increase Berg Balance score by > 6 points to demonstrate decreased fall risk during functional activities.    Baseline 02/07: 35/56; 01/20/2021 50/56    Time 8    Period [place]  Status Achieved      PT LONG TERM GOAL #4   Title Patient will increase six minute walk test distance to >1000 for progression to community ambulator and improve gait ability    Baseline 02/07: 725 ft, 01/15/21  1020 ft;    Time 8    Period Weeks    Status Achieved      PT LONG TERM GOAL #5   Title Patient will increase 10 meter walk test to >1.69m/s as to improve gait speed for better community ambulation and to reduce fall risk.    Baseline 02/07: 0.84 m/s; 01/20/2021 1.08 m/s close CGA    Time 8    Period Weeks    Status Achieved      PT LONG TERM GOAL #6   Title Patient will increase dynamic gait index score to >19/24 as to demonstrate reduced fall risk and improved dynamic gait balance for better safety with community/home ambulation.    Baseline 01/20/2021 18/24; 4/6: 18/24. Stepping over box is big limiting factor from meeting goal; 4/13: 22/24    Time 6    Period Weeks    Status Achieved      PT LONG TERM GOAL #7   Title Pt will improve 6MWT to > or equal to 1700' for age norms to display safe ability to complete community ambulatn tasks without need for caregiver HHA support.    Baseline 4/6: 1,065'; 4/13: 1195 ft; 5/18: 1060 ft no AD    Time 8    Period Weeks    Status On-going    Target Date 04/21/21      PT LONG TERM GOAL #8   Title Pt will improve ABC balance scale to > 67% to display clinicially significant decreased risk of falls with household ADL completion    Baseline 4/6: 51.25%; 4/13: 83.12%    Time 6    Period Weeks    Status Achieved                 Plan - 03/31/21 1710    Clinical Impression Statement Pt goals reviewed today to further assess pt progress in therapy. Pt FOTO was 58, 5xSTS 13.39 sec, and 6MWT was 1060 ft. She maintained similar scores to FOTO and 5xSTS  from previous testing and saw slight decrease in 6MWT. Reviewed extensive HEP for remainder of session and pt exhibits good demo of all exercises. Pt will benefit from further skilled therapy to reinforce independence with HEP and to improve balance and gait ability to decrease fall risk.    Personal Factors and Comorbidities Age    Comorbidities L rotator cuff tear    Examination-Activity Limitations Bathing;Reach Overhead;Lift;Squat    Examination-Participation Restrictions Cleaning    Stability/Clinical Decision Making Stable/Uncomplicated    Rehab Potential Good    PT Frequency 1x / week    PT Duration 8 weeks    PT Treatment/Interventions Aquatic Therapy;Moist Heat;Gait training;Stair training;Functional mobility training;Therapeutic activities;Therapeutic exercise;Balance training;Neuromuscular re-education;Patient/family education;Manual lymph drainage;Passive range of motion;Dry needling    PT Next Visit Plan further review HEP    PT Home Exercise Plan update: Access Code: [email or web address]    Consulted and Agree with Plan of Care Patient;Family member/caregiver    Family Member Consulted aide, Izora Gala           Patient will benefit from skilled therapeutic intervention in order to improve the following deficits and impairments:  Abnormal gait,Decreased activity tolerance,Decreased endurance,Decreased strength,Decreased balance,Decreased mobility,Difficulty walking  Visit Diagnosis: Unsteadiness on feet  Other  abnormalities of gait and mobility  Muscle weakness (generalized)     Problem List Patient Active Problem List   Diagnosis Date Noted  . Left rotator cuff tear 02/08/2018   Ricard Dillon PT, DPT 03/31/2021, 5:18 PM  [place] MAIN [facility] SERVICES [address] [place], [place], [address] Phone: [phone]   Fax:  [phone]  Name: Shannon Donovan MRN: [id number] Date of Birth: 09-08-1951

## 2021-04-05 ENCOUNTER — Ambulatory Visit: Payer: Medicare Other

## 2021-04-07 ENCOUNTER — Other Ambulatory Visit: Payer: Self-pay

## 2021-04-07 ENCOUNTER — Ambulatory Visit: Payer: Medicare Other

## 2021-04-07 DIAGNOSIS — R2681 Unsteadiness on feet: Secondary | ICD-10-CM

## 2021-04-07 DIAGNOSIS — R2689 Other abnormalities of gait and mobility: Secondary | ICD-10-CM

## 2021-04-07 DIAGNOSIS — M6281 Muscle weakness (generalized): Secondary | ICD-10-CM

## 2021-04-07 NOTE — Therapy (Signed)
[place] MAIN [facility] SERVICES [address] [place], [place], [address] Phone: [phone]   Fax:  [phone]  Physical Therapy Treatment  Patient Details  Name: Shannon Donovan MRN: [id number] Date of Birth: 1951-07-19 Referring Provider (PT): Jennings Books   Encounter Date: 04/07/2021   PT End of Session - 04/07/21 1051    Visit Number 26    Number of Visits 45    Date for PT Re-Evaluation 04/21/21    Authorization Type Medicare    Authorization Time Period 02/17/21- 03/31/21    PT Start Time 1049    PT Stop Time 1136    PT Time Calculation (min) 47 min    Equipment Utilized During Treatment Gait belt    Activity Tolerance Patient tolerated treatment well    Behavior During Therapy [facility] for tasks assessed/performed           Past Medical History:  Diagnosis Date  . Adolescent scoliosis   . Anxiety   . Atrial fibrillation ([place])   . Dysrhythmia    A fib  . History of chicken pox   . Hyperlipidemia   . Hyperplastic colon polyp 09/05/2016   Tubular adenoma of colon  . Hypertension    benign/ controlled  . Short-term memory loss   . Shoulder tendinitis    LEFT upper biceps tendonitis, rotator cuff tendinitis  . Stroke ([place]) 81/8299   embolic CVA/ short term memory loss    Past Surgical History:  Procedure Laterality Date  . BACK SURGERY     x2 at age 67 and 52 for scoliosis  . COLONOSCOPY N/A 09/05/2016   Procedure: COLONOSCOPY;  Surgeon: Lollie Sails, MD;  Location: [facility] ENDOSCOPY;  Service: Endoscopy;  Laterality: N/A;  Pt on Coumadin (PT/ INR)  . SHOULDER ARTHROSCOPY WITH OPEN ROTATOR CUFF REPAIR Left 02/08/2018   Procedure: SHOULDER ARTHROSCOPY WITH OPEN ROTATOR CUFF REPAIR;  Surgeon: Corky Mull, MD;  Location: [facility] ORS;  Service: Orthopedics;  Laterality: Left;  . SHOULDER CLOSED REDUCTION Left 05/09/2018   Procedure: CLOSED MANIPULATION SHOULDER  UNDER ANESTHESIA WITH STEROID INJECTION;  Surgeon: Corky Mull,  MD;  Location: [place];  Service: Orthopedics;  Laterality: Left;  . TONSILLECTOMY      There were no vitals filed for this visit.   Subjective Assessment - 04/07/21 1050    Subjective Pt reports no pain currently. Pt denies falls/near-falls. Pt reports performing some HEP.    Pertinent History 70 y.o. female presents to skilled physical therapy with a recent referral of imbalance. 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 She states she is very cautious and slow when she is ambulating in the community. She does poorly with ambulating in areas with a combination of high ceilings and big crowds such as the mall. She reports of having 1 fall in the house in the past 6 months. She states she was getting up from a surface and slipped and when she tried to catch the fall then her foot turned wrong.    Currently in Pain? No/denies    Pain Onset More than a month ago          TREATMENT:      Therex:  Sit to Stand - 1 sets - 10 reps; pt reports exercise is "pretty easy," progressed to - 1x15, 1x12 pt rates "medium" Staggered STS - attempted, pt unable to perform with LE positioning Standing Hip Abduction at support surface - 3  sets - 12 reps; pt difficulty with technique suggestive of decreased B quad strength Sitting Knee Extension with 2.5# AW -3 sets - 10 reps Standing marches with 2.5# AW- 3 sets - 15 reps   Neuro Re-Ed:  Stepping over hurdle forward/backward/side-to-side - x multiple reps for each. Pt with poor foot clearance and kicks over hurdle numerous times. Stepping onto and off of 6" step- decreasing levels of UE support, cued for quick, big steps to clear hurdle. Improved with cuing. SLB - 2x30 sec each LE Tandem stance - 2x30 sec each LE  Education provided with VC/TC and demonstration for movement at target joints and to facilitate correct muscle activation with all exercises. Pt exhibits goodcarryover within session after cuing.      PT  Education - 04/07/21 1146    Education Details Exercise technique, body mechanics    Person(s) Educated Patient    Methods Explanation;Demonstration;Verbal cues;Tactile cues    Comprehension Verbalized understanding;Returned demonstration            PT Short Term Goals - 03/31/21 1713      PT SHORT TERM GOAL #1   Title Patient will be independent in home exercise program to improve strength/mobility for better functional independence with ADLs.    Baseline 01/20/2021 Pt currently walking at home for exercise. Not fully indep with HEP (issued new handout). 4/6: indep with HEP. Doesnt rely on caregiver to assist; 5/18: pt performing HEP almost every day.    Time 4    Period Weeks    Status Achieved    Target Date 02/17/21      PT SHORT TERM GOAL #2   Title Patient will reduce timed up and go to <11 seconds to reduce fall risk and demonstrate improved transfer/gait ability.    Baseline 02/07: 11.93s; 01/20/2021 11.81 seconds; 4/6: 10.39 sec    Time 4    Period Weeks    Status Achieved    Target Date 02/17/21             PT Long Term Goals - 03/31/21 1714      PT LONG TERM GOAL #1   Title Patient will increase FOTO score to equal to or greater than to 67 demonstrate statistically significant improvement in mobility and quality of life.    Baseline 02/07: 60; 01/20/2021 49; 4/6: Deferred due to internet connectivity issues. Will update next session. 4/13: 56; 5/18: 58    Time 8    Period Weeks    Status On-going    Target Date 04/21/21      PT LONG TERM GOAL #2   Title Patient (> 74 years old) will complete five times sit to stand test in < 15 seconds indicating an increased LE strength and improved balance.    Baseline 02/07: 34.13s; 01/20/2021 13.35 sec; 5/18 13.39 sec    Time 8    Period Weeks    Status Achieved      PT LONG TERM GOAL #3   Title Patient will increase Berg Balance score by > 6 points to demonstrate decreased fall risk during functional activities.    Baseline  02/07: 35/56; 01/20/2021 50/56    Time 8    Period Weeks    Status Achieved      PT LONG TERM GOAL #4   Title Patient will increase six minute walk test distance to >1000 for progression to community ambulator and improve gait ability    Baseline 02/07: 725 ft, 01/15/21  1020 ft;  Time 8    Period Weeks    Status Achieved      PT LONG TERM GOAL #5   Title Patient will increase 10 meter walk test to >1.31m/s as to improve gait speed for better community ambulation and to reduce fall risk.    Baseline 02/07: 0.84 m/s; 01/20/2021 1.08 m/s close CGA    Time 8    Period Weeks    Status Achieved      PT LONG TERM GOAL #6   Title Patient will increase dynamic gait index score to >19/24 as to demonstrate reduced fall risk and improved dynamic gait balance for better safety with community/home ambulation.    Baseline 01/20/2021 18/24; 4/6: 18/24. Stepping over box is big limiting factor from meeting goal; 4/13: 22/24    Time 6    Period Weeks    Status Achieved      PT LONG TERM GOAL #7   Title Pt will improve 6MWT to > or equal to 1700' for age norms to display safe ability to complete community ambulatn tasks without need for caregiver HHA support.    Baseline 4/6: 1,065'; 4/13: 1195 ft; 5/18: 1060 ft no AD    Time 8    Period Weeks    Status On-going    Target Date 04/21/21      PT LONG TERM GOAL #8   Title Pt will improve ABC balance scale to > 67% to display clinicially significant decreased risk of falls with household ADL completion    Baseline 4/6: 51.25%; 4/13: 83.12%    Time 6    Period Weeks    Status Achieved                 Plan - 04/07/21 1143    Clinical Impression Statement Pt has difficulty with technique with standing hip abduction with maintaining knee extension, indicating decreased quad strength B. Followed exercise with focus on quad strengthening with LAQ. Pt also fatigues quickly with all therex, requiring frequent rest breaks. Pt performance of stepping  onto and off of 6" step improved with cuing for quick, large steps, otherwise pt unable to perform movement. This is suggestive of pt FOF being primary limiting factor as pt exhibited no decrease in postural stability or LOB with exercise. The pt will benefit from further skilled therapy to improve BLE strength, endurance and balance to increase ease and safety with all functional mobility.    Personal Factors and Comorbidities Age    Comorbidities L rotator cuff tear    Examination-Activity Limitations Bathing;Reach Overhead;Lift;Squat    Examination-Participation Restrictions Cleaning    Stability/Clinical Decision Making Stable/Uncomplicated    Rehab Potential Good    PT Frequency 1x / week    PT Duration 8 weeks    PT Treatment/Interventions Aquatic Therapy;Moist Heat;Gait training;Stair training;Functional mobility training;Therapeutic activities;Therapeutic exercise;Balance training;Neuromuscular re-education;Patient/family education;Manual lymph drainage;Passive range of motion;Dry needling    PT Next Visit Plan further review HEP    PT Home Exercise Plan update: Access Code: [email or web address]    Consulted and Agree with Plan of Care Patient;Family member/caregiver    Family Member Consulted aide, Ivin Booty           Patient will benefit from skilled therapeutic intervention in order to improve the following deficits and impairments:  Abnormal gait,Decreased activity tolerance,Decreased endurance,Decreased strength,Decreased balance,Decreased mobility,Difficulty walking  Visit Diagnosis: Unsteadiness on feet  Muscle weakness (generalized)  Other abnormalities of gait and mobility     Problem List Patient Active Problem List  Diagnosis Date Noted  . Left rotator cuff tear 02/08/2018   Ricard Dillon PT, DPT 04/07/2021, 11:47 AM  [place] MAIN [facility] SERVICES [address] [place], [place], [address] Phone: [phone]   Fax:   [phone]  Name: Shannon Donovan MRN: [id number] Date of Birth: 10-03-1951

## 2021-04-14 ENCOUNTER — Ambulatory Visit: Payer: Medicare Other

## 2021-04-16 ENCOUNTER — Ambulatory Visit: Payer: Medicare Other | Attending: Neurology

## 2021-04-16 ENCOUNTER — Other Ambulatory Visit: Payer: Self-pay

## 2021-04-16 DIAGNOSIS — R2689 Other abnormalities of gait and mobility: Secondary | ICD-10-CM | POA: Diagnosis present

## 2021-04-16 DIAGNOSIS — M6281 Muscle weakness (generalized): Secondary | ICD-10-CM | POA: Diagnosis present

## 2021-04-16 DIAGNOSIS — R2681 Unsteadiness on feet: Secondary | ICD-10-CM | POA: Diagnosis not present

## 2021-04-16 NOTE — Therapy (Signed)
[place] MAIN [facility] SERVICES [address] [place], [place], [address] Phone: [phone]   Fax:  [phone]  Physical Therapy Treatment  Patient Details  Name: Shannon Donovan MRN: [id number] Date of Birth: November 08, 1951 Referring Provider (PT): Jennings Books   Encounter Date: 04/16/2021   PT End of Session - 04/16/21 0906    Visit Number 27    Number of Visits 45    Date for PT Re-Evaluation 04/21/21    Authorization Type Medicare    Authorization Time Period 02/17/21- 03/31/21    PT Start Time 0802    PT Stop Time 0847    PT Time Calculation (min) 45 min    Equipment Utilized During Treatment Gait belt    Activity Tolerance Patient tolerated treatment well    Behavior During Therapy [facility] for tasks assessed/performed           Past Medical History:  Diagnosis Date  . Adolescent scoliosis   . Anxiety   . Atrial fibrillation ([place])   . Dysrhythmia    A fib  . History of chicken pox   . Hyperlipidemia   . Hyperplastic colon polyp 09/05/2016   Tubular adenoma of colon  . Hypertension    benign/ controlled  . Short-term memory loss   . Shoulder tendinitis    LEFT upper biceps tendonitis, rotator cuff tendinitis  . 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 BACK SURGERY     x2 at age 45 and 89 for scoliosis  . COLONOSCOPY N/A 09/05/2016   Procedure: COLONOSCOPY;  Surgeon: Lollie Sails, MD;  Location: [facility] ENDOSCOPY;  Service: Endoscopy;  Laterality: N/A;  Pt on Coumadin (PT/ INR)  . SHOULDER ARTHROSCOPY WITH OPEN ROTATOR CUFF REPAIR Left 02/08/2018   Procedure: SHOULDER ARTHROSCOPY WITH OPEN ROTATOR CUFF REPAIR;  Surgeon: Corky Mull, MD;  Location: [facility] ORS;  Service: Orthopedics;  Laterality: Left;  . SHOULDER CLOSED REDUCTION Left 05/09/2018   Procedure: CLOSED MANIPULATION SHOULDER  UNDER ANESTHESIA WITH STEROID INJECTION;  Surgeon: Corky Mull,  MD;  Location: [place];  Service: Orthopedics;  Laterality: Left;  . TONSILLECTOMY      There were no vitals filed for this visit.   Subjective Assessment - 04/16/21 0804    Subjective Pt reports she has been walking. She will be visiting family soon. Pt reports performing some of her HEP.    Pertinent History 70 y.o. female presents to skilled physical therapy with a recent referral of imbalance. 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 She states she is very cautious and slow when she is ambulating in the community. She does poorly with ambulating in areas with a combination of high ceilings and big crowds such as the mall. She reports of having 1 fall in the house in the past 6 months. She states she was getting up from a surface and slipped and when she tried to catch the fall then her foot turned wrong.    Pain Onset More than a month ago          TREATMENT   Interventions  CGA provided for balance exercises.   Sit to Stand - 3 sets - 10 reps; pt rates easy-medium  Standing Hip Abduction with Counter Support - 3 sets - 10 reps  Single Leg Stance with Support  2 sets 30 sec hold each LE  Standing Tandem Balance with Counter Support 2 sets - 30 hold each LE  Sitting Knee Extension with Resistance - 3 sets - 10 reps with 3# AW; pt rates easy-medium  Seated Hamstring Stretch with Chair -2x30 sec hold  Stepping onto and off of 6" step- decreasing levels of UE support, cued for quick, big steps to clear step. Pt able to improve technique and perform without UE support by end of set.     Education provided throughout session in the form of VC/TC/Demo to facilitate correct muscle activation and movement at target joints with exercises. Pt exhibits good carryover within session after cuing.     PT Education - 04/16/21 0911    Education Details continued to review HEP    Person(s) Educated Patient    Methods Explanation;Demonstration;Verbal  cues;Tactile cues    Comprehension Verbalized understanding;Returned demonstration            PT Short Term Goals - 03/31/21 1713      PT SHORT TERM GOAL #1   Title Patient will be independent in home exercise program to improve strength/mobility for better functional independence with ADLs.    Baseline 01/20/2021 Pt currently walking at home for exercise. Not fully indep with HEP (issued new handout). 4/6: indep with HEP. Doesnt rely on caregiver to assist; 5/18: pt performing HEP almost every day.    Time 4    Period Weeks    Status Achieved    Target Date 02/17/21      PT SHORT TERM GOAL #2   Title Patient will reduce timed up and go to <11 seconds to reduce fall risk and demonstrate improved transfer/gait ability.    Baseline 02/07: 11.93s; 01/20/2021 11.81 seconds; 4/6: 10.39 sec    Time 4    Period Weeks    Status Achieved    Target Date 02/17/21             PT Long Term Goals - 03/31/21 1714      PT LONG TERM GOAL #1   Title Patient will increase FOTO score to equal to or greater than to 67 demonstrate statistically significant improvement in mobility and quality of life.    Baseline 02/07: 60; 01/20/2021 49; 4/6: Deferred due to internet connectivity issues. Will update next session. 4/13: 56; 5/18: 58    Time 8    Period Weeks    Status On-going    Target Date 04/21/21      PT LONG TERM GOAL #2   Title Patient (> 80 years old) will complete five times sit to stand test in < 15 seconds indicating an increased LE strength and improved balance.    Baseline 02/07: 34.13s; 01/20/2021 13.35 sec; 5/18 13.39 sec    Time 8    Period Weeks    Status Achieved      PT LONG TERM GOAL #3   Title Patient will increase Berg Balance score by > 6 points to demonstrate decreased fall risk during functional activities.    Baseline 02/07: 35/56; 01/20/2021 50/56    Time 8    Period Weeks    Status Achieved      PT LONG TERM GOAL #4   Title Patient will increase six minute walk test  distance to >1000 for progression to community ambulator and improve gait ability    Baseline 02/07: 725 ft, 01/15/21  1020 ft;    Time 8    Period Weeks    Status Achieved  PT LONG TERM GOAL #5   Title Patient will increase 10 meter walk test to >1.64m/s as to improve gait speed for better community ambulation and to reduce fall risk.    Baseline 02/07: 0.84 m/s; 01/20/2021 1.08 m/s close CGA    Time 8    Period Weeks    Status Achieved      PT LONG TERM GOAL #6   Title Patient will increase dynamic gait index score to >19/24 as to demonstrate reduced fall risk and improved dynamic gait balance for better safety with community/home ambulation.    Baseline 01/20/2021 18/24; 4/6: 18/24. Stepping over box is big limiting factor from meeting goal; 4/13: 22/24    Time 6    Period Weeks    Status Achieved      PT LONG TERM GOAL #7   Title Pt will improve 6MWT to > or equal to 1700' for age norms to display safe ability to complete community ambulatn tasks without need for caregiver HHA support.    Baseline 4/6: 1,065'; 4/13: 1195 ft; 5/18: 1060 ft no AD    Time 8    Period Weeks    Status On-going    Target Date 04/21/21      PT LONG TERM GOAL #8   Title Pt will improve ABC balance scale to > 67% to display clinicially significant decreased risk of falls with household ADL completion    Baseline 4/6: 51.25%; 4/13: 83.12%    Time 6    Period Weeks    Status Achieved                 Plan - 04/16/21 0906    Clinical Impression Statement PT continued to review/educate pt on HEP this session. PT discussed with pt ways to incorporate exercises into ADLs and how to progress them to best support compliance with HEP. Pt verbalized understanding and was able to demo exercises with good technique. The pt will benefit from further skilled PT to improve BLE strength, balance, and endurance to increase safety with all functional mobility.    Personal Factors and Comorbidities Age     Comorbidities L rotator cuff tear    Examination-Activity Limitations Bathing;Reach Overhead;Lift;Squat    Examination-Participation Restrictions Cleaning    Stability/Clinical Decision Making Stable/Uncomplicated    Rehab Potential Good    PT Frequency 1x / week    PT Duration 8 weeks    PT Treatment/Interventions Aquatic Therapy;Moist Heat;Gait training;Stair training;Functional mobility training;Therapeutic activities;Therapeutic exercise;Balance training;Neuromuscular re-education;Patient/family education;Manual lymph drainage;Passive range of motion;Dry needling    PT Next Visit Plan further review HEP    PT Home Exercise Plan update: Access Code: [email or web address]    Consulted and Agree with Plan of Care Patient;Family member/caregiver    Family Member Consulted caregiver Izora Gala           Patient will benefit from skilled therapeutic intervention in order to improve the following deficits and impairments:  Abnormal gait,Decreased activity tolerance,Decreased endurance,Decreased strength,Decreased balance,Decreased mobility,Difficulty walking  Visit Diagnosis: Unsteadiness on feet  Muscle weakness (generalized)  Other abnormalities of gait and mobility     Problem List Patient Active Problem List   Diagnosis Date Noted  . Left rotator cuff tear 02/08/2018   Ricard Dillon PT, DPT 04/16/2021, 9:11 AM  [place] MAIN [facility] SERVICES [address] [place], [place], [address] Phone: [phone]   Fax:  [phone]  Name: Shannon Donovan MRN: [id number] Date of Birth: 22-Dec-1950

## 2021-04-21 ENCOUNTER — Ambulatory Visit: Payer: Medicare Other

## 2021-04-28 ENCOUNTER — Ambulatory Visit: Payer: Medicare Other

## 2021-05-05 ENCOUNTER — Other Ambulatory Visit: Payer: Self-pay

## 2021-05-05 ENCOUNTER — Ambulatory Visit: Payer: Medicare Other

## 2021-05-05 DIAGNOSIS — R2689 Other abnormalities of gait and mobility: Secondary | ICD-10-CM

## 2021-05-05 DIAGNOSIS — R2681 Unsteadiness on feet: Secondary | ICD-10-CM

## 2021-05-05 DIAGNOSIS — M6281 Muscle weakness (generalized): Secondary | ICD-10-CM

## 2021-05-05 NOTE — Therapy (Signed)
[place] MAIN [facility] SERVICES [address] [place], [place], [address] Phone: [phone]   Fax:  [phone]  Physical Therapy Treatment/DISCHARGE SUMMARY  Patient Details  Name: Shannon Donovan MRN: [id number] Date of Birth: 1951-03-06 Referring Provider (PT): Jennings Books   Encounter Date: 05/05/2021   PT End of Session - 05/06/21 1014     Visit Number 28    Number of Visits 45    Date for PT Re-Evaluation 04/21/21    Authorization Type Medicare    Authorization Time Period 02/17/21- 03/31/21    PT Start Time 1151    PT Stop Time 1220    PT Time Calculation (min) 29 min    Equipment Utilized During Treatment Gait belt    Activity Tolerance Patient tolerated treatment well    Behavior During Therapy WFL for tasks assessed/performed             Past Medical History:  Diagnosis Date   Adolescent scoliosis    Anxiety    Atrial fibrillation ([place])    Dysrhythmia    A fib   History of chicken pox    Hyperlipidemia    Hyperplastic colon polyp 09/05/2016   Tubular adenoma of colon   Hypertension    benign/ controlled   Short-term memory loss    Shoulder tendinitis    LEFT upper biceps tendonitis, rotator cuff tendinitis   Stroke ([place]) 16/3845   embolic CVA/ short term memory loss    Past Surgical History:  Procedure Laterality Date   BACK SURGERY     x2 at age 89 and 77 for scoliosis   COLONOSCOPY N/A 09/05/2016   Procedure: COLONOSCOPY;  Surgeon: Lollie Sails, MD;  Location: [facility] ENDOSCOPY;  Service: Endoscopy;  Laterality: N/A;  Pt on Coumadin (PT/ INR)   SHOULDER ARTHROSCOPY WITH OPEN ROTATOR CUFF REPAIR Left 02/08/2018   Procedure: SHOULDER ARTHROSCOPY WITH OPEN ROTATOR CUFF REPAIR;  Surgeon: Corky Mull, MD;  Location: [facility] ORS;  Service: Orthopedics;  Laterality: Left;   SHOULDER CLOSED REDUCTION Left 05/09/2018   Procedure: CLOSED MANIPULATION SHOULDER  UNDER ANESTHESIA WITH STEROID INJECTION;  Surgeon: Corky Mull, MD;  Location: [place];  Service: Orthopedics;  Laterality: Left;   TONSILLECTOMY      There were no vitals filed for this visit.   Subjective Assessment - 05/05/21 1339     Subjective Pt reports she had a great time seeing her brother. She reports no major changes since previous visit. Denies falls or near-falls. Sh reports doing some of her HEP.    Pertinent History 70 y.o. female presents to skilled physical therapy with a recent referral of imbalance. 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 She states she is very cautious and slow when she is ambulating in the community. She does poorly with ambulating in areas with a combination of high ceilings and big crowds such as the mall. She reports of having 1 fall in the house in the past 6 months. She states she was getting up from a surface and slipped and when she tried to catch the fall then her foot turned wrong.    Currently in Pain? No/denies    Pain Onset More than a month ago            Treatment  FOTO: 60 (partially met)  6MWT 1200 ft with no AD (improved)  Access Code: [email or web address] URL: [email or web address] Date: 03/31/2021 Prepared by: Ricard Dillon  Did 10x of the following to review HEP, CGA throughout Exercises Sit to Stand - 10 reps  Standing Hip Abduction with Counter Support -10 reps each LE   Single Leg Stance with Support - 1 rep- 30 hold each LE  Standing Tandem Balance with Counter Support - 1 rep - 30 hold each LE  Sitting Knee Extension with Resistance - 1 set - 10 reps each LE  PT instructed pt in exam findings, indications for D/C, reinforced HEP and reviewed HEP with pt and caregiver. PT also instructed pt in that should she have questions or concerns to contact PT after d/c.  Assessment: FOTO and remaining 6MWT goal reviewed at follow-up session today, as pt feels her functional mobility and balance has improved and she reports she is ready for  d/c. Over the course of therapy, pt has achieved all but two of her therapy goals. Her FOTO score did improve to 60, indicating she has partially met this goal. And while pt did not meet 6MWT goal, she did improve performance to ambulating 1200 ft in 6 minutes. Pt reports she feels independent with performing HEP on her own and that she is able to continue gains made in PT outside of PT. PT did review HEP with pt and her caregiver and instructed pt to contact PT after d/c should she have questions or concerns. Pt and caregiver both verbalized understanding. The pt does not require further skilled therapy at this time and is to be discharged from PT.     PT Education - 05/05/21 1340     Education Details review of HEP, retesting of goals and progress toward goals. Education on d/c and that pt can contact PT following d/c should she have questions.    Person(s) Educated Patient;Caregiver(s)   Building control surveyor, Izora Gala   Methods Explanation;Demonstration;Tactile cues;Verbal cues;Handout    Comprehension Verbalized understanding;Returned demonstration              PT Short Term Goals - 05/06/21 1015       PT SHORT TERM GOAL #1   Title Patient will be independent in home exercise program to improve strength/mobility for better functional independence with ADLs.    Baseline 01/20/2021 Pt currently walking at home for exercise. Not fully indep with HEP (issued new handout). 4/6: indep with HEP. Doesnt rely on caregiver to assist; 5/18: pt performing HEP almost every day.    Time 4    Period Weeks    Status Achieved    Target Date 02/17/21      PT SHORT TERM GOAL #2   Title Patient will reduce timed up and go to <11 seconds to reduce fall risk and demonstrate improved transfer/gait ability.    Baseline 02/07: 11.93s; 01/20/2021 11.81 seconds; 4/6: 10.39 sec    Time 4    Period Weeks    Status Achieved    Target Date 02/17/21               PT Long Term Goals - 05/06/21 1015       PT LONG TERM  GOAL #1   Title Patient will increase FOTO score to equal to or greater than to 67 demonstrate statistically significant improvement in mobility and quality of life.    Baseline 02/07: 60; 01/20/2021 49; 4/6: Deferred due to internet connectivity issues. Will update next session. 4/13: 56; 5/18: 58; 6/23: 60    Time 8    Period Weeks    Status Partially Met  PT LONG TERM GOAL #2   Title Patient (> 58 years old) will complete five times sit to stand test in < 15 seconds indicating an increased LE strength and improved balance.    Baseline 02/07: 34.13s; 01/20/2021 13.35 sec; 5/18 13.39 sec    Time 8    Period Weeks    Status Achieved      PT LONG TERM GOAL #3   Title Patient will increase Berg Balance score by > 6 points to demonstrate decreased fall risk during functional activities.    Baseline 02/07: 35/56; 01/20/2021 50/56;    Time 8    Period Weeks    Status Achieved      PT LONG TERM GOAL #4   Title Patient will increase six minute walk test distance to >1000 for progression to community ambulator and improve gait ability    Baseline 02/07: 725 ft, 01/15/21  1020 ft;    Time 8    Period Weeks    Status Achieved      PT LONG TERM GOAL #5   Title Patient will increase 10 meter walk test to >1.71ms as to improve gait speed for better community ambulation and to reduce fall risk.    Baseline 02/07: 0.84 m/s; 01/20/2021 1.08 m/s close CGA    Time 8    Period Weeks    Status Achieved      PT LONG TERM GOAL #6   Title Patient will increase dynamic gait index score to >19/24 as to demonstrate reduced fall risk and improved dynamic gait balance for better safety with community/home ambulation.    Baseline 01/20/2021 18/24; 4/6: 18/24. Stepping over box is big limiting factor from meeting goal; 4/13: 22/24    Time 6    Period Weeks    Status Achieved      PT LONG TERM GOAL #7   Title Pt will improve 6MWT to > or equal to 1700' for age norms to display safe ability to complete community  ambulatn tasks without need for caregiver HHA support.    Baseline 4/6: 1,065'; 4/13: 1195 ft; 5/18: 1060 ft no AD; 6/22: 1200 ft    Time 8    Period Weeks    Status Not Met      PT LONG TERM GOAL #8   Title Pt will improve ABC balance scale to > 67% to display clinicially significant decreased risk of falls with household ADL completion    Baseline 4/6: 51.25%; 4/13: 83.12%    Time 6    Period Weeks    Status Achieved                   Plan - 05/06/21 1027     Clinical Impression Statement FOTO and remaining 6MWT goal reviewed at follow-up session today, as pt feels her functional mobility and balance has improved and she reports she is ready for d/c. Over the course of therapy, pt has achieved all but two of her therapy goals. Her FOTO score did improve to 60, indicating she has partially met this goal. And while pt did not meet 6MWT goal, she did improve performance to ambulating 1200 ft in 6 minutes. Pt reports she feels independent with performing HEP on her own and that she is able to continue gains made in PT outside of PT. PT did review HEP with pt and her caregiver and instructed pt to contact PT after d/c should she have questions or concerns. Pt and caregiver both verbalized understanding.  The pt does not require further skilled therapy at this time and is to be discharged from PT.    Personal Factors and Comorbidities Age    Comorbidities L rotator cuff tear    Examination-Activity Limitations Bathing;Reach Overhead;Lift;Squat    Examination-Participation Restrictions Cleaning    Stability/Clinical Decision Making Stable/Uncomplicated    Rehab Potential Good    PT Frequency 1x / week    PT Duration 8 weeks    PT Treatment/Interventions Aquatic Therapy;Moist Heat;Gait training;Stair training;Functional mobility training;Therapeutic activities;Therapeutic exercise;Balance training;Neuromuscular re-education;Patient/family education;Manual lymph drainage;Passive range of  motion;Dry needling    PT Next Visit Plan pt d/c from PT as she does not require further skilled PT at this time    PT Home Exercise Plan update: Access Code: [email or web address]    Consulted and Agree with Plan of Care Patient;Family member/caregiver    Family Member Consulted caregiver Izora Gala             Patient will benefit from skilled therapeutic intervention in order to improve the following deficits and impairments:  Abnormal gait, Decreased activity tolerance, Decreased endurance, Decreased strength, Decreased balance, Decreased mobility, Difficulty walking  Visit Diagnosis: Unsteadiness on feet  Other abnormalities of gait and mobility  Muscle weakness (generalized)     Problem List Patient Active Problem List   Diagnosis Date Noted   Left rotator cuff tear 02/08/2018   Ricard Dillon PT, DPT 05/06/2021, 10:28 AM  [place] [address] [place], [place], [address] Phone: [phone]   Fax:  [phone]  Name: Shannon Donovan MRN: [id number] Date of Birth: 01-26-1951

## 2021-05-12 ENCOUNTER — Ambulatory Visit: Payer: Medicare Other

## 2021-05-19 ENCOUNTER — Ambulatory Visit: Payer: Medicare Other

## 2021-05-26 ENCOUNTER — Ambulatory Visit: Payer: Medicare Other

## 2021-06-02 ENCOUNTER — Ambulatory Visit: Payer: Medicare Other | Admitting: Physical Therapy

## 2021-06-09 ENCOUNTER — Ambulatory Visit: Payer: Medicare Other

## 2021-06-16 ENCOUNTER — Ambulatory Visit: Payer: Medicare Other

## 2022-02-14 ENCOUNTER — Other Ambulatory Visit: Payer: Self-pay | Admitting: Internal Medicine

## 2022-02-14 DIAGNOSIS — Z1231 Encounter for screening mammogram for malignant neoplasm of breast: Secondary | ICD-10-CM

## 2022-03-23 ENCOUNTER — Ambulatory Visit
Admission: RE | Admit: 2022-03-23 | Discharge: 2022-03-23 | Disposition: A | Discharge status: Home / Self Care | Payer: Medicare Other | Source: Ambulatory Visit | Attending: Internal Medicine | Admitting: Internal Medicine

## 2022-03-23 DIAGNOSIS — Z1231 Encounter for screening mammogram for malignant neoplasm of breast: Secondary | ICD-10-CM | POA: Diagnosis present

## 2022-03-30 ENCOUNTER — Ambulatory Visit: Payer: Medicare Other | Admitting: Physical Therapy

## 2022-04-06 ENCOUNTER — Encounter: Payer: Medicare Other | Admitting: Physical Therapy

## 2022-04-08 ENCOUNTER — Encounter: Payer: Medicare Other | Admitting: Physical Therapy

## 2022-04-08 ENCOUNTER — Ambulatory Visit: Payer: Medicare Other | Attending: Student

## 2022-04-08 ENCOUNTER — Encounter: Payer: Self-pay | Admitting: Physical Therapy

## 2022-04-08 DIAGNOSIS — M25512 Pain in left shoulder: Secondary | ICD-10-CM | POA: Insufficient documentation

## 2022-04-08 DIAGNOSIS — R293 Abnormal posture: Secondary | ICD-10-CM | POA: Insufficient documentation

## 2022-04-08 DIAGNOSIS — G8929 Other chronic pain: Secondary | ICD-10-CM | POA: Diagnosis present

## 2022-04-08 DIAGNOSIS — M25522 Pain in left elbow: Secondary | ICD-10-CM | POA: Insufficient documentation

## 2022-04-08 NOTE — Therapy (Signed)
[facility] [address] [place], [place], [address] Phone: [phone]   Fax:  [phone]  Physical Therapy Evaluation  Patient Details  Name: Shannon Donovan MRN: [id number] Date of Birth: 16-Dec-1950 No data recorded  Encounter Date: 04/08/2022   PT End of Session - 04/08/22 1236     Visit Number 1    Number of Visits 17    Date for PT Re-Evaluation 06/03/22    PT Start Time 0277    PT Stop Time 1225    PT Time Calculation (min) 49 min    Activity Tolerance Patient tolerated treatment well    Behavior During Therapy [facility] for tasks assessed/performed             Past Medical History:  Diagnosis Date   Adolescent scoliosis    Anxiety    Atrial fibrillation ([place])    Dysrhythmia    A fib   History of chicken pox    Hyperlipidemia    Hyperplastic colon polyp 09/05/2016   Tubular adenoma of colon   Hypertension    benign/ controlled   Short-term memory loss    Shoulder tendinitis    LEFT upper biceps tendonitis, rotator cuff tendinitis   Stroke ([place]) 41/2878   embolic CVA/ short term memory loss    Past Surgical History:  Procedure Laterality Date   BACK SURGERY     x2 at age 45 and 72 for scoliosis   COLONOSCOPY N/A 09/05/2016   Procedure: COLONOSCOPY;  Surgeon: Lollie Sails, MD;  Location: [facility];  Service: Endoscopy;  Laterality: N/A;  Pt on Coumadin (PT/ INR)   SHOULDER ARTHROSCOPY WITH OPEN ROTATOR CUFF REPAIR Left 02/08/2018   Procedure: SHOULDER ARTHROSCOPY WITH OPEN ROTATOR CUFF REPAIR;  Surgeon: Corky Mull, MD;  Location: [facility];  Service: Orthopedics;  Laterality: Left;   SHOULDER CLOSED REDUCTION Left 05/09/2018   Procedure: CLOSED MANIPULATION SHOULDER  UNDER ANESTHESIA WITH STEROID INJECTION;  Surgeon: Corky Mull, MD;  Location: [place];  Service: Orthopedics;  Laterality: Left;   TONSILLECTOMY      There were no vitals filed for this visit.     Subjective Assessment - 04/08/22 1138     Subjective Pt is a 71 y.o. 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 Pt has been battling L shoulder pain and elbow pain for multiple years. Pt has been having limited L shoulder mobility due to secondary adhesive capsulitis. Pt unable to specify if the elbow has related to her L shoulder pain.    Pertinent History Per PA Cameron Proud on 02/25/22:  Shannon Donovan is a 71 y.o. female who presents for follow-up of her left elbow pain secondary to degenerative joint disease. The patient was last seen for these symptoms 3 months ago. At this visit, she received a steroid injection which she states provided mild relief of her symptoms, lasting several weeks before her symptoms began to recur. She again notes moderate-severe pain in her elbow which she rates at 8/10 on today's visit. Her symptoms are aggravated by repetitive use. She has pain at night as well. She has been taking Tylenol and applying ice as necessary with limited benefit. She denies any reinjury to the elbow, and denies any numbness or paresthesias down her arm to her hand. She presents today requesting a repeat injection into the left elbow.    The patient also returns for follow-up of her left shoulder pain. The patient is now 3.5 years status post a manipulation under anesthesia with steroid injection of her left shoulder for secondary adhesive capsulitis following a left shoulder arthroscopy debridement and repair of a partial-thickness rotator cuff tear. The patient was last seen for these symptoms 3 years ago. She has noted increased pain in her shoulder over the past few months, as well as continued limitation in shoulder motion which is unchanged as compared to prior visits. She is unable to raise her arm even to  shoulder level and has difficulty when trying to reach behind her back. She also has pain at night. She denies any reinjury to the shoulder, and denies any fevers or chills. She presents today requesting an injection into the left shoulder.  Pt present to PT today unable to describe type of pain. L elbow recorded at 2-3/10 NPS. Worst pain recorded upwarrds to 7-8/10 NPS. No shoulder pain at rest, worst shoulder pain recorded at up to 8/10 NPS. L elbow pain worsened with movement, no pain with gripping tasks or manipulation tasks but does require use of BUE's to assist. Pt reports elbow pain located at whole elbow recorded as deep. Pt reports improvement in elbow pain with rest and provided support with arm at her tummy improves pain. Pt has pain in L shoulder with motion but is globally very restricted in her overhead motion. Pt reports lateral L shoulder pain and deep . Pt's biggest goals are to improve pain and L arm mobility.    Limitations Lifting;House hold activities    Patient Stated Goals Improve pain and L shoulder/elbow mobility so she doesnt have to rely on RUE so much    Currently in Pain? Yes    Pain Score 3     Pain Location Shoulder    Pain Orientation Left    Pain Descriptors / Indicators Grimacing;Guarding   unable to give descriptors/quality of pain   Pain Onset More than a month ago    Pain Frequency Intermittent    Aggravating Factors  L shoulder mobility hurts L elbow    Pain Relieving Factors Rest, supporting arm at her side, medication/injections    Effect of Pain on Daily Activities Significantly limited ability of LUE use for ADL's (feeding, bathing, dressing, overhead)    Multiple Pain Sites Yes    Pain Score 3    Pain Location Elbow    Pain Orientation Left    Pain Descriptors / Indicators Grimacing;Guarding    Pain Type Chronic pain    Pain Onset More than a month ago    Pain Frequency Intermittent  OBJECTIVE  MUSCULOSKELETAL: Tremor:  Normal Bulk: Normal   Cervical Screen AROM:  Flexion: 55 deg  Extension: 10 deg  Rotation R/L: 45/45 deg Lat flexion R/L: 15/20 deg    Elbow Screen Elbow AROM:   R elbow: 30-135 deg   L elbow AROM: 25-90 deg and painful at end ranges  L elbow PROM: 15-105 deg painful at both end ranges  Palpation TTP along L olecranon process. Denies tenderness or pain along medial/lateral epicondyles. No TTP noted along aspects of forearm musculature, bicep/tricep muscle bellies or insertions/origins or along L shoulder.  Strength R/L 4/2 Shoulder flexion (anterior deltoid/pec major/coracobrachialis, axillary n. (C5-6) and musculocutaneous n. (C5-7)) 4/2 Shoulder abduction (deltoid/supraspinatus, axillary/suprascapular n, C5) 5/1 Shoulder external rotation (infraspinatus/teres minor) 5/5 Shoulder internal rotation (subcapularis/lats/pec major) 5/5 Elbow flexion (biceps brachii, brachialis, brachioradialis, musculoskeletal n, C5-6) 5/5 Elbow extension (triceps, radial n, C7) 5/5 Wrist Extension 5/5 Wrist Flexion Grip symmetrical   AROM R/L 180/47* Shoulder flexion 180/55 Shoulder abduction 90/0 Shoulder external rotation 70/70 Shoulder internal rotation *Indicates pain, overpressure performed unless otherwise indicated  PROM R/L 180/80* Shoulder flexion 180/59* Shoulder abduction 90/15* Shoulder external rotation 70/70 Shoulder internal rotation  *Indicates pain, overpressure performed unless otherwise indicated  Accessory Motions/Glides Glenohumeral: Posterior: R: normal L: normal Inferior: R: normal L: normal Anterior: R: normal L: normal  NEUROLOGICAL:  Mental Status Patient's fund of knowledge is within normal limits for educational level.   Sensation Grossly intact to light touch bilateral UE as determined by testing dermatomes C2-T2 Proprioception and hot/cold testing deferred on this date    HEP provided as listed below. Education on  reps/sets/frequency.  Access Code: [email or web address] URL: [email or web address] Date: 04/08/2022 Prepared by: Larna Daughters  Exercises - Supine Elbow Extension Stretch in Supination  - 3 x daily - 7 x weekly - 1 sets - 5 reps - 30 hold - Supine Shoulder Flexion AAROM with Hands Clasped  - 1 x daily - 7 x weekly - 3 sets - 20 reps     Objective measurements completed on examination: See above findings.    PT Education - 04/08/22 1236     Education Details PT POC, prognosis, HEP    Person(s) Educated Patient;Caregiver(s)    Methods Explanation;Demonstration;Tactile cues;Verbal cues;Handout    Comprehension Verbalized understanding              PT Short Term Goals - 04/08/22 1244       PT SHORT TERM GOAL #1   Title Pt will be independent with HEP to improve pain/mobility of L elbow and shoulder for ADL completion    Baseline 04/08/22: Initiated    Time 4    Period Weeks    Status New    Target Date 05/06/22               PT Long Term Goals - 04/08/22 1245       PT LONG TERM GOAL #1   Title Pt will increase FOTO to target score to demonstrate clinically significant improvement in functional mobility    Baseline 04/08/22: Deferred to next session    Time 8    Period Weeks    Status New    Target Date 06/03/22      PT LONG TERM GOAL #2   Title Pt will improve L shoulder AROM in flexion  > 10 deg to demonstrate clinically significant improvement and ability to perform overhead/ in front ADL's.    Baseline 04/08/22: 47 deg flex, 55 deg abduction  Time 8    Period Weeks    Status New    Target Date 06/03/22      PT LONG TERM GOAL #3   Title Pt will improve L elbow AROM in flexion and extension by at least 5 deg/direction to assist in LUE ADL's such as dressing/feeding    Baseline 04/08/22: 25-90 deg    Time 8    Period Weeks    Status New    Target Date 06/03/22      PT LONG TERM GOAL #4   Title Pt will report < 6/10 NPS as worst pain in L shoulder  and elbow with LUE tasks to demonstrate clinically significant reduction in pain levels with ADL's.    Baseline 04/08/22: Up to 8/10 NPS    Time 8    Period Weeks    Status New    Target Date 06/03/22                    Plan - 04/08/22 1237     Clinical Impression Statement Pt is a pleasant 71 y.o. female referred to OPPT for L shoulder/elbow pain. Pt has chronic limitations in L shoulder after history of Adhesive capsulitis secondary to rotator cuff repair. Pt presenting with significantly limited L shoulder flexion, ER, and abduction and elbow AROM deficits. AROM limitations appear due to muscular tightness and weakness with possible RTC tears present limiting pt's significant limitaitons in overhead mobility. Pt does present with greater passive motion than active with normal GHJ springing testing that is painless. Elbow pain seems to be reproduced primairly with L shoulder mobility and at olecranon with end range flexion and extension but not as great of quantity compared to shoulder mobility. These strength and AROM limitations are significantly limiting pt's ability to perform infront/overhead ADL's such as bathing, feeding, grooming tasks thus heavily realying on RUE and/or BUE's to perform all needed ADL's. Pt will continue to benefit from skilled PT services to address these deficits to optimize LUE use for ADL's.    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   7/11    Essential hypertension, benign    History of chicken pox    Hyperplastic colon  polyp 09/05/2016    OSA on CPAP 06/01/2020    Other and unspecified hyperlipidemia    Rotator cuff tendinitis, left 12/22/2017    Tubular adenoma of colon 09/05/2016    Vascular dementia without behavioral disturbance (CMS-HCC) 05/27/2020    Examination-Activity Limitations Bathing;Hygiene/Grooming;Bed Mobility;Caring for Others;Reach Overhead;Self Feeding;Carry;Dressing    Examination-Participation Restrictions Church;Cleaning;Shop;Community Activity;Meal Prep;Driving    Stability/Clinical Decision Making Evolving/Moderate complexity    Clinical Decision Making Moderate    Rehab Potential Fair    PT Frequency 2x / week    PT Duration 8 weeks    PT Treatment/Interventions ADLs/Self Care Home Management;Electrical Stimulation;Aquatic Therapy;Moist Heat;Cryotherapy;Traction;Functional mobility training;Therapeutic activities;Patient/family education;Therapeutic exercise;Neuromuscular re-education;Manual techniques;Dry needling;Passive range of motion;Spinal Manipulations;Joint Manipulations    PT Next Visit Plan FOTO, reassess HEP, further evaluation of L elbow if indicated    PT Home Exercise Plan Access  Code: [email or web address]    Consulted and Agree with Plan of Care Patient;Family member/caregiver    Family Member Consulted Caregiver             Patient will benefit from skilled therapeutic intervention in order to improve the following deficits and impairments:  Pain, Improper body mechanics, Decreased mobility, Increased muscle spasms, Postural dysfunction, Decreased activity tolerance, Decreased range of motion, Decreased strength, Impaired flexibility  Visit Diagnosis: Abnormal posture - Plan: PT plan of care cert/re-cert  Chronic left shoulder pain - Plan: PT plan of care cert/re-cert  Pain in left elbow - Plan: PT plan of care cert/re-cert     Problem List Patient Active Problem List   Diagnosis Date Noted   Left rotator cuff tear 02/08/2018   Salem Caster. Fairly IV, PT, DPT Physical  Therapist- [facility]  04/08/2022, 12:59 PM  [facility] [address] [place], [address] Phone: [phone]   Fax:  [phone]  Name: Shannon Donovan MRN: [id number] Date of Birth: May 20, 1951

## 2022-04-18 ENCOUNTER — Encounter: Payer: Medicare Other | Admitting: Physical Therapy

## 2022-04-20 ENCOUNTER — Encounter: Payer: Self-pay | Admitting: Physical Therapy

## 2022-04-20 ENCOUNTER — Ambulatory Visit: Payer: Medicare Other | Attending: Student | Admitting: Physical Therapy

## 2022-04-20 DIAGNOSIS — M25512 Pain in left shoulder: Secondary | ICD-10-CM | POA: Insufficient documentation

## 2022-04-20 DIAGNOSIS — G8929 Other chronic pain: Secondary | ICD-10-CM | POA: Insufficient documentation

## 2022-04-20 DIAGNOSIS — R293 Abnormal posture: Secondary | ICD-10-CM | POA: Insufficient documentation

## 2022-04-20 DIAGNOSIS — M25522 Pain in left elbow: Secondary | ICD-10-CM | POA: Diagnosis present

## 2022-04-20 NOTE — Therapy (Signed)
[facility] [address], [place], [address] Phone: [phone]   Fax:  [phone]  Physical Therapy Treatment  Patient Details  Name: Shannon Donovan MRN: [id number] Date of Birth: 1951/04/12 No data recorded  Encounter Date: 04/20/2022   PT End of Session - 04/20/22 1059     Visit Number 2    Number of Visits 17    Date for PT Re-Evaluation 06/03/22    Authorization - Visit Number 2    Progress Note Due on Visit 10    PT Start Time 1002    PT Stop Time 1044    PT Time Calculation (min) 42 min    Activity Tolerance Patient tolerated treatment well    Behavior During Therapy [facility] for tasks assessed/performed             Past Medical History:  Diagnosis Date   Adolescent scoliosis    Anxiety    Atrial fibrillation ([place])    Dysrhythmia    A fib   History of chicken pox    Hyperlipidemia    Hyperplastic colon polyp 09/05/2016   Tubular adenoma of colon   Hypertension    benign/ controlled   Short-term memory loss    Shoulder tendinitis    LEFT upper biceps tendonitis, rotator cuff tendinitis   Stroke ([place]) 08/2724   embolic CVA/ short term memory loss    Past Surgical History:  Procedure Laterality Date   BACK SURGERY     x2 at age 4 and 89 for scoliosis   COLONOSCOPY N/A 09/05/2016   Procedure: COLONOSCOPY;  Surgeon: Lollie Sails, MD;  Location: [facility] ENDOSCOPY;  Service: Endoscopy;  Laterality: N/A;  Pt on Coumadin (PT/ INR)   SHOULDER ARTHROSCOPY WITH OPEN ROTATOR CUFF REPAIR Left 02/08/2018   Procedure: SHOULDER ARTHROSCOPY WITH OPEN ROTATOR CUFF REPAIR;  Surgeon: Corky Mull, MD;  Location: [facility] ORS;  Service: Orthopedics;  Laterality: Left;   SHOULDER CLOSED REDUCTION Left 05/09/2018   Procedure: CLOSED MANIPULATION SHOULDER  UNDER ANESTHESIA WITH STEROID INJECTION;  Surgeon: Corky Mull, MD;  Location: [facility];  Service: Orthopedics;  Laterality: Left;    TONSILLECTOMY      There were no vitals filed for this visit.   Subjective Assessment - 04/20/22 1044     Subjective Pt reports completing HEP, though with some difficult. 8/10 pain in L elbow. Discouraged that recent shot did not help pain    Pertinent History Per PA Cameron Proud on 02/25/22:                                                                                                                                                                Shannon Donovan is a 71  y.o. female who presents for follow-up of her left elbow pain secondary to degenerative joint disease. The patient was last seen for these symptoms 3 months ago. At this visit, she received a steroid injection which she states provided mild relief of her symptoms, lasting several weeks before her symptoms began to recur. She again notes moderate-severe pain in her elbow which she rates at 8/10 on today's visit. Her symptoms are aggravated by repetitive use. She has pain at night as well. She has been taking Tylenol and applying ice as necessary with limited benefit. She denies any reinjury to the elbow, and denies any numbness or paresthesias down her arm to her hand. She presents today requesting a repeat injection into the left elbow.    The patient also returns for follow-up of her left shoulder pain. The patient is now 3.5 years status post a manipulation under anesthesia with steroid injection of her left shoulder for secondary adhesive capsulitis following a left shoulder arthroscopy debridement and repair of a partial-thickness rotator cuff tear. The patient was last seen for these symptoms 3 years ago. She has noted increased pain in her shoulder over the past few months, as well as continued limitation in shoulder motion which is unchanged as compared to prior visits. She is unable to raise her arm even to shoulder level and has difficulty when trying to reach behind her back. She also has pain at night. She denies any reinjury to  the shoulder, and denies any fevers or chills. She presents today requesting an injection into the left shoulder.  Pt present to PT today unable to describe type of pain. L elbow recorded at 2-3/10 NPS. Worst pain recorded upwarrds to 7-8/10 NPS. No shoulder pain at rest, worst shoulder pain recorded at up to 8/10 NPS. L elbow pain worsened with movement, no pain with gripping tasks or manipulation tasks but does require use of BUE's to assist. Pt reports elbow pain located at whole elbow recorded as deep. Pt reports improvement in elbow pain with rest and provided support with arm at her tummy improves pain. Pt has pain in L shoulder with motion but is globally very restricted in her overhead motion. Pt reports lateral L shoulder pain and deep . Pt's biggest goals are to improve pain and L arm mobility.    Limitations Lifting;House hold activities    Patient Stated Goals Improve pain and L shoulder/elbow mobility so she doesnt have to rely on RUE so much    Pain Onset More than a month ago    Pain Onset More than a month ago              Ther-Ex Nustep seat 7 UE 11 70mns no resistance for gentle AAROM of L shoulder elbow; cuing to use RUE to move L Intermittent supination stretches to RUE during manual technqiues AAROM dowel BUE flex (supine) x12 with max cuing for available elbow ext L elbow ext stretch at door corner 153m Education on on posture without guarding to maintain gains made in session for mobility with understanding  Manual Supination stretch 11m23mwith large roll support  Bicep STM with trigger point release ; followed by supination stretch with small roll support; again followed by no support; followed by: (W/ heat applied to bicep) Radial + ulnar PA mob G1 2x 30sec for pain modulation progressed to G3 4x 30sec bout for increased mobility In supination stretch STM with trigger point release to L tricep; followed by (unbilled) heat to tricep group  2mns Manual supination  stretch 164m able to complete with dorsum of hand touching mat                          PT Education - 04/20/22 1058     Education Details therex form/technique, postural awareness    Person(s) Educated Patient    Methods Explanation;Demonstration;Verbal cues    Comprehension Verbalized understanding;Returned demonstration;Verbal cues required              PT Short Term Goals - 04/08/22 1244       PT SHORT TERM GOAL #1   Title Pt will be independent with HEP to improve pain/mobility of L elbow and shoulder for ADL completion    Baseline 04/08/22: Initiated    Time 4    Period Weeks    Status New    Target Date 05/06/22               PT Long Term Goals - 04/08/22 1245       PT LONG TERM GOAL #1   Title Pt will increase FOTO to target score to demonstrate clinically significant improvement in functional mobility    Baseline 04/08/22: Deferred to next session    Time 8    Period Weeks    Status New    Target Date 06/03/22      PT LONG TERM GOAL #2   Title Pt will improve L shoulder AROM in flexion  > 10 deg to demonstrate clinically significant improvement and ability to perform overhead/ in front ADL's.    Baseline 04/08/22: 47 deg flex, 55 deg abduction    Time 8    Period Weeks    Status New    Target Date 06/03/22      PT LONG TERM GOAL #3   Title Pt will improve L elbow AROM in flexion and extension by at least 5 deg/direction to assist in LUE ADL's such as dressing/feeding    Baseline 04/08/22: 25-90 deg    Time 8    Period Weeks    Status New    Target Date 06/03/22      PT LONG TERM GOAL #4   Title Pt will report < 6/10 NPS as worst pain in L shoulder and elbow with LUE tasks to demonstrate clinically significant reduction in pain levels with ADL's.    Baseline 04/08/22: Up to 8/10 NPS    Time 8    Period Weeks    Status New    Target Date 06/03/22                   Plan - 04/20/22 1100     Clinical Impression  Statement PT initiated therex progression for increased L shoulder and elbow mobility and pain reduction with success. patietn with marked increase in elbow ext and supination at end of session. PT encouraged patient and caregiver to continue HEP stretching to maintain mobility progress made in session with understadnign. Patient is able to comply with all cuing for proepr technique of therex with good motivation throughout session. PT encouraged neutral posture with prevention of guarding elbow in fle xnad shoulder hiking with understandning. PT will continue progression as able.    Personal Factors and Comorbidities Age;Comorbidity 3+;Education;Past/Current Experience;Time since onset of injury/illness/exacerbation    Comorbidities Adolescent scoliosis    Adult idiopathic generalized osteoporosis 05/02/2019   BD 1/20, Fosamax started 1/20    Allergic state   Penicillin, Latex  Anxiety   Not bad    Arthritis Left upper arm   Cortisone shot helps for a short while    Atrial fibrillation (427.31) 03/26/2014    Cerebrovascular accident, embolic (CMS-HCC) 0/12/4095   7/11    Essential hypertension, benign    History of chicken pox    Hyperplastic colon polyp 09/05/2016    OSA on CPAP 06/01/2020    Other and unspecified hyperlipidemia    Rotator cuff tendinitis, left 12/22/2017    Tubular adenoma of colon 09/05/2016    Vascular dementia without behavioral disturbance (CMS-HCC) 05/27/2020    Examination-Activity Limitations Bathing;Hygiene/Grooming;Bed Mobility;Caring for Others;Reach Overhead;Self Feeding;Carry;Dressing    Examination-Participation Restrictions Church;Cleaning;Shop;Community Activity;Meal Prep;Driving    Stability/Clinical Decision Making Evolving/Moderate complexity    Clinical Decision Making Moderate    Rehab Potential Fair    PT Frequency 2x / week    PT Duration 8 weeks    PT Treatment/Interventions ADLs/Self Care Home Management;Electrical Stimulation;Aquatic Therapy;Moist  Heat;Cryotherapy;Traction;Functional mobility training;Therapeutic activities;Patient/family education;Therapeutic exercise;Neuromuscular re-education;Manual techniques;Dry needling;Passive range of motion;Spinal Manipulations;Joint Manipulations    PT Next Visit Plan FOTO, reassess HEP, further evaluation of L elbow if indicated    PT Home Exercise Plan Access Code: [email or web address]    Consulted and Agree with Plan of Care Patient;Family member/caregiver    Family Member Consulted Caregiver             Patient will benefit from skilled therapeutic intervention in order to improve the following deficits and impairments:  Pain, Improper body mechanics, Decreased mobility, Increased muscle spasms, Postural dysfunction, Decreased activity tolerance, Decreased range of motion, Decreased strength, Impaired flexibility  Visit Diagnosis: Abnormal posture  Chronic left shoulder pain     Problem List Patient Active Problem List   Diagnosis Date Noted   Left rotator cuff tear 02/08/2018   Durwin Reges DPT Durwin Reges, PT 04/20/2022, 11:04 AM  [facility] [address] [place], [address] Phone: [phone]   Fax:  [phone]  Name: Shannon Donovan MRN: [id number] Date of Birth: [birth year]

## 2022-04-27 ENCOUNTER — Ambulatory Visit: Payer: Medicare Other | Admitting: Physical Therapy

## 2022-04-27 ENCOUNTER — Encounter: Payer: Self-pay | Admitting: Physical Therapy

## 2022-04-27 DIAGNOSIS — G8929 Other chronic pain: Secondary | ICD-10-CM

## 2022-04-27 DIAGNOSIS — R293 Abnormal posture: Secondary | ICD-10-CM

## 2022-04-27 NOTE — Therapy (Signed)
[facility] [address], [place], [address] Phone: [phone]   Fax:  [phone]  Physical Therapy Treatment  Patient Details  Name: Shannon Donovan MRN: [id number] Date of Birth: 01/02/1951 No data recorded  Encounter Date: 04/27/2022   PT End of Session - 04/27/22 1052     Visit Number 3    Number of Visits 17    Date for PT Re-Evaluation 06/03/22    Authorization - Visit Number 3    Progress Note Due on Visit 10    PT Start Time 6237    PT Stop Time 1125    PT Time Calculation (min) 40 min    Activity Tolerance Patient tolerated treatment well             Past Medical History:  Diagnosis Date   Adolescent scoliosis    Anxiety    Atrial fibrillation ([place])    Dysrhythmia    A fib   History of chicken pox    Hyperlipidemia    Hyperplastic colon polyp 09/05/2016   Tubular adenoma of colon   Hypertension    benign/ controlled   Short-term memory loss    Shoulder tendinitis    LEFT upper biceps tendonitis, rotator cuff tendinitis   Stroke ([place]) 62/8315   embolic CVA/ short term memory loss    Past Surgical History:  Procedure Laterality Date   BACK SURGERY     x2 at age 71 and 16 for scoliosis   COLONOSCOPY N/A 09/05/2016   Procedure: COLONOSCOPY;  Surgeon: Lollie Sails, MD;  Location: [facility] ENDOSCOPY;  Service: Endoscopy;  Laterality: N/A;  Pt on Coumadin (PT/ INR)   SHOULDER ARTHROSCOPY WITH OPEN ROTATOR CUFF REPAIR Left 02/08/2018   Procedure: SHOULDER ARTHROSCOPY WITH OPEN ROTATOR CUFF REPAIR;  Surgeon: Corky Mull, MD;  Location: [facility] ORS;  Service: Orthopedics;  Laterality: Left;   SHOULDER CLOSED REDUCTION Left 05/09/2018   Procedure: CLOSED MANIPULATION SHOULDER  UNDER ANESTHESIA WITH STEROID INJECTION;  Surgeon: Corky Mull, MD;  Location: [facility];  Service: Orthopedics;  Laterality: Left;   TONSILLECTOMY      There were no vitals filed for this  visit.   Subjective Assessment - 04/27/22 1049     Subjective Pt reports doing fairly well today, wlbow pain 5/10 at start or session. Doing well with HEP    Pertinent History Per PA Cameron Proud on 02/25/22:                                                                                                                                                                Shannon Donovan is a 71 y.o. female who presents for follow-up of her left elbow pain secondary to degenerative  joint disease. The patient was last seen for these symptoms 3 months ago. At this visit, she received a steroid injection which she states provided mild relief of her symptoms, lasting several weeks before her symptoms began to recur. She again notes moderate-severe pain in her elbow which she rates at 8/10 on today's visit. Her symptoms are aggravated by repetitive use. She has pain at night as well. She has been taking Tylenol and applying ice as necessary with limited benefit. She denies any reinjury to the elbow, and denies any numbness or paresthesias down her arm to her hand. She presents today requesting a repeat injection into the left elbow.    The patient also returns for follow-up of her left shoulder pain. The patient is now 3.5 years status post a manipulation under anesthesia with steroid injection of her left shoulder for secondary adhesive capsulitis following a left shoulder arthroscopy debridement and repair of a partial-thickness rotator cuff tear. The patient was last seen for these symptoms 3 years ago. She has noted increased pain in her shoulder over the past few months, as well as continued limitation in shoulder motion which is unchanged as compared to prior visits. She is unable to raise her arm even to shoulder level and has difficulty when trying to reach behind her back. She also has pain at night. She denies any reinjury to the shoulder, and denies any fevers or chills. She presents today requesting an injection  into the left shoulder.  Pt present to PT today unable to describe type of pain. L elbow recorded at 2-3/10 NPS. Worst pain recorded upwarrds to 7-8/10 NPS. No shoulder pain at rest, worst shoulder pain recorded at up to 8/10 NPS. L elbow pain worsened with movement, no pain with gripping tasks or manipulation tasks but does require use of BUE's to assist. Pt reports elbow pain located at whole elbow recorded as deep. Pt reports improvement in elbow pain with rest and provided support with arm at her tummy improves pain. Pt has pain in L shoulder with motion but is globally very restricted in her overhead motion. Pt reports lateral L shoulder pain and deep . Pt's biggest goals are to improve pain and L arm mobility.    Limitations Lifting;House hold activities    Patient Stated Goals Improve pain and L shoulder/elbow mobility so she doesnt have to rely on RUE so much    Pain Onset More than a month ago    Pain Onset More than a month ago              Ther-Ex Nustep seat 7 UE 11 53mns no resistance for gentle AAROM of L shoulder elbow; cuing to use RUE to move L AAROM dowel BUE chest press (supine) x12 with max cuing for available elbow ext AAROM dowel BUE flex (supine) x12 approx shoulder flex to 70d L elbow ext stretch at door corner 152m   Manual         Bicep STM with trigger point release during supination stretch with large roll support under wrist; followed by supination stretch with small roll support 38m4m STM with trigger point release  to bicep again followed supination by no support with overpressure; followed by: (W/ heat applied to bicep) Radial + ulnar PA mob G1 2x 30sec for pain modulation progressed to G3 4x 30sec bout for increased mobility In supination stretch overpressure with heat on tricep group  PT Education - 04/27/22 1051     Education Details therex form/technique    Person(s) Educated Patient    Methods  Explanation;Demonstration;Verbal cues    Comprehension Verbalized understanding;Returned demonstration;Verbal cues required              PT Short Term Goals - 04/08/22 1244       PT SHORT TERM GOAL #1   Title Pt will be independent with HEP to improve pain/mobility of L elbow and shoulder for ADL completion    Baseline 04/08/22: Initiated    Time 4    Period Weeks    Status New    Target Date 05/06/22               PT Long Term Goals - 04/08/22 1245       PT LONG TERM GOAL #1   Title Pt will increase FOTO to target score to demonstrate clinically significant improvement in functional mobility    Baseline 04/08/22: Deferred to next session    Time 8    Period Weeks    Status New    Target Date 06/03/22      PT LONG TERM GOAL #2   Title Pt will improve L shoulder AROM in flexion  > 10 deg to demonstrate clinically significant improvement and ability to perform overhead/ in front ADL's.    Baseline 04/08/22: 47 deg flex, 55 deg abduction    Time 8    Period Weeks    Status New    Target Date 06/03/22      PT LONG TERM GOAL #3   Title Pt will improve L elbow AROM in flexion and extension by at least 5 deg/direction to assist in LUE ADL's such as dressing/feeding    Baseline 04/08/22: 25-90 deg    Time 8    Period Weeks    Status New    Target Date 06/03/22      PT LONG TERM GOAL #4   Title Pt will report < 6/10 NPS as worst pain in L shoulder and elbow with LUE tasks to demonstrate clinically significant reduction in pain levels with ADL's.    Baseline 04/08/22: Up to 8/10 NPS    Time 8    Period Weeks    Status New    Target Date 06/03/22                   Plan - 04/27/22 1128     Clinical Impression Statement PT continued manual techniques for increased mobility, with some success. Patient with apparent adhesive capsulitis in frozen stage (flex<abd<ER limitation pattern, and painless with hard end feels) limiting progress. PT continued elbow  mobility through manual, stretching, and repeated motion with patient motivated and able to comply with all cuing. Paitent does report increased pain to 8/10 with all motions. Pt able to ultimately demonstrate full elbow supination with ext ~10d. PT will continue progression as able.    Personal Factors and Comorbidities Age;Comorbidity 3+;Education;Past/Current Experience;Time since onset of injury/illness/exacerbation    Comorbidities Adolescent scoliosis    Adult idiopathic generalized osteoporosis 05/02/2019   BD 1/20, Fosamax started 1/20    Allergic state   Penicillin, Latex    Anxiety   Not bad    Arthritis Left upper arm   Cortisone shot helps for a short while    Atrial fibrillation (427.31) 03/26/2014    Cerebrovascular accident, embolic (CMS-HCC) 0/07/8118   7/11    Essential hypertension, benign    History of  chicken pox    Hyperplastic colon polyp 09/05/2016    OSA on CPAP 06/01/2020    Other and unspecified hyperlipidemia    Rotator cuff tendinitis, left 12/22/2017    Tubular adenoma of colon 09/05/2016    Vascular dementia without behavioral disturbance (CMS-HCC) 05/27/2020    Examination-Activity Limitations Bathing;Hygiene/Grooming;Bed Mobility;Caring for Others;Reach Overhead;Self Feeding;Carry;Dressing    Examination-Participation Restrictions Church;Cleaning;Shop;Community Activity;Meal Prep;Driving    Stability/Clinical Decision Making Evolving/Moderate complexity    Clinical Decision Making Moderate    Rehab Potential Fair    PT Frequency 2x / week    PT Duration 8 weeks    PT Treatment/Interventions ADLs/Self Care Home Management;Electrical Stimulation;Aquatic Therapy;Moist Heat;Cryotherapy;Traction;Functional mobility training;Therapeutic activities;Patient/family education;Therapeutic exercise;Neuromuscular re-education;Manual techniques;Dry needling;Passive range of motion;Spinal Manipulations;Joint Manipulations    PT Next Visit Plan FOTO, reassess HEP, further evaluation of L elbow if  indicated    PT Home Exercise Plan Access Code: [email or web address]    Consulted and Agree with Plan of Care Patient;Family member/caregiver    Family Member Consulted Caregiver             Patient will benefit from skilled therapeutic intervention in order to improve the following deficits and impairments:  Pain, Improper body mechanics, Decreased mobility, Increased muscle spasms, Postural dysfunction, Decreased activity tolerance, Decreased range of motion, Decreased strength, Impaired flexibility  Visit Diagnosis: Abnormal posture  Chronic left shoulder pain     Problem List Patient Active Problem List   Diagnosis Date Noted   Left rotator cuff tear 02/08/2018   Durwin Reges DPT Durwin Reges, PT 04/27/2022, 11:53 AM  [facility] [address] [place], [address] Phone: [phone]   Fax:  [phone]  Name: Aidynn Polendo MRN: [id number] Date of Birth: 1951/01/10

## 2022-04-29 ENCOUNTER — Ambulatory Visit: Payer: Medicare Other | Admitting: Physical Therapy

## 2022-04-29 ENCOUNTER — Encounter: Payer: Self-pay | Admitting: Physical Therapy

## 2022-04-29 DIAGNOSIS — M25522 Pain in left elbow: Secondary | ICD-10-CM

## 2022-04-29 DIAGNOSIS — R293 Abnormal posture: Secondary | ICD-10-CM | POA: Diagnosis not present

## 2022-04-29 DIAGNOSIS — G8929 Other chronic pain: Secondary | ICD-10-CM

## 2022-04-29 NOTE — Therapy (Signed)
[place] [facility] [address], [place], [address] Phone: [phone]   Fax:  [phone]  Physical Therapy Treatment  Patient Details  Name: Shannon Donovan MRN: [id number] Date of Birth: 31-Mar-1951 No data recorded  Encounter Date: 04/29/2022   PT End of Session - 04/29/22 1138     Visit Number 4    Number of Visits 17    Date for PT Re-Evaluation 06/03/22    Authorization - Visit Number 4    Progress Note Due on Visit 10    PT Start Time 1132    PT Stop Time 1210    PT Time Calculation (min) 38 min    Activity Tolerance Patient tolerated treatment well    Behavior During Therapy [facility] for tasks assessed/performed             Past Medical History:  Diagnosis Date   Adolescent scoliosis    Anxiety    Atrial fibrillation ([place])    Dysrhythmia    A fib   History of chicken pox    Hyperlipidemia    Hyperplastic colon polyp 09/05/2016   Tubular adenoma of colon   Hypertension    benign/ controlled   Short-term memory loss    Shoulder tendinitis    LEFT upper biceps tendonitis, rotator cuff tendinitis   Stroke ([place]) 67/1245   embolic CVA/ short term memory loss    Past Surgical History:  Procedure Laterality Date   BACK SURGERY     x2 at age 24 and 5 for scoliosis   COLONOSCOPY N/A 09/05/2016   Procedure: COLONOSCOPY;  Surgeon: Lollie Sails, MD;  Location: [facility] ENDOSCOPY;  Service: Endoscopy;  Laterality: N/A;  Pt on Coumadin (PT/ INR)   SHOULDER ARTHROSCOPY WITH OPEN ROTATOR CUFF REPAIR Left 02/08/2018   Procedure: SHOULDER ARTHROSCOPY WITH OPEN ROTATOR CUFF REPAIR;  Surgeon: Corky Mull, MD;  Location: [facility] ORS;  Service: Orthopedics;  Laterality: Left;   SHOULDER CLOSED REDUCTION Left 05/09/2018   Procedure: CLOSED MANIPULATION SHOULDER  UNDER ANESTHESIA WITH STEROID INJECTION;  Surgeon: Corky Mull, MD;  Location: [place];  Service: Orthopedics;  Laterality: Left;    TONSILLECTOMY      There were no vitals filed for this visit.   Subjective Assessment - 04/29/22 1135     Subjective Pt reports she is doing fairly well, no pain before session. Continued to complete HEP after last session, reports she thinks her motion might be a little better in the elbow    Pertinent History Per PA Cameron Proud on 02/25/22:  Shannon Donovan is a 71 y.o. female who presents for follow-up of her left elbow pain secondary to degenerative joint disease. The patient was last seen for these symptoms 3 months ago. At this visit, she received a steroid injection which she states provided mild relief of her symptoms, lasting several weeks before her symptoms began to recur. She again notes moderate-severe pain in her elbow which she rates at 8/10 on today's visit. Her symptoms are aggravated by repetitive use. She has pain at night as well. She has been taking Tylenol and applying ice as necessary with limited benefit. She denies any reinjury to the elbow, and denies any numbness or paresthesias down her arm to her hand. She presents today requesting a repeat injection into the left elbow.    The patient also returns for follow-up of her left shoulder pain. The patient is now 3.5 years status post a manipulation under anesthesia with steroid injection of her left shoulder for secondary adhesive capsulitis following a left shoulder arthroscopy debridement and repair of a partial-thickness rotator cuff tear. The patient was last seen for these symptoms 3 years ago. She has noted increased pain in her shoulder over the past few months, as well as continued limitation in shoulder motion which is unchanged as compared to prior visits. She is unable to raise her arm even to shoulder level and has difficulty when trying to reach behind her back. She  also has pain at night. She denies any reinjury to the shoulder, and denies any fevers or chills. She presents today requesting an injection into the left shoulder.  Pt present to PT today unable to describe type of pain. L elbow recorded at 2-3/10 NPS. Worst pain recorded upwarrds to 7-8/10 NPS. No shoulder pain at rest, worst shoulder pain recorded at up to 8/10 NPS. L elbow pain worsened with movement, no pain with gripping tasks or manipulation tasks but does require use of BUE's to assist. Pt reports elbow pain located at whole elbow recorded as deep. Pt reports improvement in elbow pain with rest and provided support with arm at her tummy improves pain. Pt has pain in L shoulder with motion but is globally very restricted in her overhead motion. Pt reports lateral L shoulder pain and deep . Pt's biggest goals are to improve pain and L arm mobility.    Limitations Lifting;House hold activities    Patient Stated Goals Improve pain and L shoulder/elbow mobility so she doesnt have to rely on RUE so much    Pain Onset More than a month ago    Pain Onset More than a month ago              Ther-Ex Nustep seat 7 UE 11 63mns no resistance for gentle AAROM of L shoulder elbow; cuing to use RUE to move L Supine shoulder abd with max elbow ext and forearm supination available x12  Supine AAROM dowel BUE chest press with max flex prior (max elbow flex> max ext)  x12 with max cuing for available elbow ext L elbow ext stretch at door corner 131m   Manual         Bicep STM with trigger point release during supination stretch with small roll support under wrist; STM with trigger point release  to bicep again followed supination by no support with overpressure; STM with trigger point release  to pronator teres again followed supination by no support with overpressure;followed by: (W/ heat applied to bicep) Radial + ulnar PA  mob G1 2x 30sec for pain modulation progressed to G3 2x 30sec bout for increased  mobility; ulnar to radial glide with supination movement x28mn  In supination stretch overpressure with heat on tricep group                                PT Education - 04/29/22 1137     Education Details therex form/technique    Person(s) Educated Patient    Methods Explanation;Demonstration;Verbal cues    Comprehension Verbalized understanding;Returned demonstration;Verbal cues required              PT Short Term Goals - 04/08/22 1244       PT SHORT TERM GOAL #1   Title Pt will be independent with HEP to improve pain/mobility of L elbow and shoulder for ADL completion    Baseline 04/08/22: Initiated    Time 4    Period Weeks    Status New    Target Date 05/06/22               PT Long Term Goals - 04/08/22 1245       PT LONG TERM GOAL #1   Title Pt will increase FOTO to target score to demonstrate clinically significant improvement in functional mobility    Baseline 04/08/22: Deferred to next session    Time 8    Period Weeks    Status New    Target Date 06/03/22      PT LONG TERM GOAL #2   Title Pt will improve L shoulder AROM in flexion  > 10 deg to demonstrate clinically significant improvement and ability to perform overhead/ in front ADL's.    Baseline 04/08/22: 47 deg flex, 55 deg abduction    Time 8    Period Weeks    Status New    Target Date 06/03/22      PT LONG TERM GOAL #3   Title Pt will improve L elbow AROM in flexion and extension by at least 5 deg/direction to assist in LUE ADL's such as dressing/feeding    Baseline 04/08/22: 25-90 deg    Time 8    Period Weeks    Status New    Target Date 06/03/22      PT LONG TERM GOAL #4   Title Pt will report < 6/10 NPS as worst pain in L shoulder and elbow with LUE tasks to demonstrate clinically significant reduction in pain levels with ADL's.    Baseline 04/08/22: Up to 8/10 NPS    Time 8    Period Weeks    Status New    Target Date 06/03/22                    Plan - 04/29/22 1210     Clinical Impression Statement PT continued to utilize manual techniques to increase mobility and decrease muscle tension with success. Patietn is demonstrating increased mobility between sessions allowing increased time for pronator teres STM which demonstrates increased tension/trigger points as well. PT continued therex progression for increased mobility with patient able to comply with cuing for technique with good motivation and no increased pain following end of session. PT will conitnue progression as able.    Personal Factors and Comorbidities Age;Comorbidity 3+;Education;Past/Current Experience;Time since onset of injury/illness/exacerbation    Comorbidities Adolescent scoliosis    Adult idiopathic generalized osteoporosis 05/02/2019   BD 1/20, Fosamax started 1/20    Allergic  state   Penicillin, Latex    Anxiety   Not bad    Arthritis Left upper arm   Cortisone shot helps for a short while    Atrial fibrillation (427.31) 03/26/2014    Cerebrovascular accident, embolic (CMS-HCC) 07/23/3569   7/11    Essential hypertension, benign    History of chicken pox    Hyperplastic colon polyp 09/05/2016    OSA on CPAP 06/01/2020    Other and unspecified hyperlipidemia    Rotator cuff tendinitis, left 12/22/2017    Tubular adenoma of colon 09/05/2016    Vascular dementia without behavioral disturbance (CMS-HCC) 05/27/2020    Examination-Activity Limitations Bathing;Hygiene/Grooming;Bed Mobility;Caring for Others;Reach Overhead;Self Feeding;Carry;Dressing    Examination-Participation Restrictions Church;Cleaning;Shop;Community Activity;Meal Prep;Driving    Stability/Clinical Decision Making Evolving/Moderate complexity    Clinical Decision Making Moderate    Rehab Potential Fair    PT Frequency 2x / week    PT Duration 8 weeks    PT Treatment/Interventions ADLs/Self Care Home Management;Electrical Stimulation;Aquatic Therapy;Moist Heat;Cryotherapy;Traction;Functional  mobility training;Therapeutic activities;Patient/family education;Therapeutic exercise;Neuromuscular re-education;Manual techniques;Dry needling;Passive range of motion;Spinal Manipulations;Joint Manipulations    PT Next Visit Plan continue POC    PT Home Exercise Plan Access Code: [email or web address]    Consulted and Agree with Plan of Care Patient;Family member/caregiver    Family Member Consulted Caregiver             Patient will benefit from skilled therapeutic intervention in order to improve the following deficits and impairments:  Pain, Improper body mechanics, Decreased mobility, Increased muscle spasms, Postural dysfunction, Decreased activity tolerance, Decreased range of motion, Decreased strength, Impaired flexibility  Visit Diagnosis: Abnormal posture  Chronic left shoulder pain  Pain in left elbow     Problem List Patient Active Problem List   Diagnosis Date Noted   Left rotator cuff tear 02/08/2018    Durwin Reges, PT 04/29/2022, 12:18 PM  [place] [facility] [address] [place], [address] Phone: [phone]   Fax:  [phone]  Name: Shannon Donovan MRN: [id number] Date of Birth: [birth year]

## 2022-05-04 ENCOUNTER — Encounter: Payer: Self-pay | Admitting: Physical Therapy

## 2022-05-04 ENCOUNTER — Ambulatory Visit: Payer: Medicare Other

## 2022-05-04 DIAGNOSIS — R293 Abnormal posture: Secondary | ICD-10-CM | POA: Diagnosis not present

## 2022-05-04 DIAGNOSIS — G8929 Other chronic pain: Secondary | ICD-10-CM

## 2022-05-04 DIAGNOSIS — M25522 Pain in left elbow: Secondary | ICD-10-CM

## 2022-05-04 NOTE — Therapy (Signed)
[place] [facility] [address] [place], [place], [address] Phone: [phone]   Fax:  [phone]  Physical Therapy Treatment  Patient Details  Name: Shannon Donovan MRN: [id number] Date of Birth: April 26, 1951 No data recorded  Encounter Date: 05/04/2022   PT End of Session - 05/04/22 1055     Visit Number 5    Number of Visits 17    Date for PT Re-Evaluation 06/03/22    Authorization - Visit Number 5    Progress Note Due on Visit 10    PT Start Time 1050    PT Stop Time 1133    PT Time Calculation (min) 43 min    Activity Tolerance Patient tolerated treatment well    Behavior During Therapy [facility] for tasks assessed/performed             Past Medical History:  Diagnosis Date   Adolescent scoliosis    Anxiety    Atrial fibrillation ([place])    Dysrhythmia    A fib   History of chicken pox    Hyperlipidemia    Hyperplastic colon polyp 09/05/2016   Tubular adenoma of colon   Hypertension    benign/ controlled   Short-term memory loss    Shoulder tendinitis    LEFT upper biceps tendonitis, rotator cuff tendinitis   Stroke ([place]) 75/1700   embolic CVA/ short term memory loss    Past Surgical History:  Procedure Laterality Date   BACK SURGERY     x2 at age 70 and 80 for scoliosis   COLONOSCOPY N/A 09/05/2016   Procedure: COLONOSCOPY;  Surgeon: Lollie Sails, MD;  Location: [facility] ENDOSCOPY;  Service: Endoscopy;  Laterality: N/A;  Pt on Coumadin (PT/ INR)   SHOULDER ARTHROSCOPY WITH OPEN ROTATOR CUFF REPAIR Left 02/08/2018   Procedure: SHOULDER ARTHROSCOPY WITH OPEN ROTATOR CUFF REPAIR;  Surgeon: Corky Mull, MD;  Location: [facility] ORS;  Service: Orthopedics;  Laterality: Left;   SHOULDER CLOSED REDUCTION Left 05/09/2018   Procedure: CLOSED MANIPULATION SHOULDER  UNDER ANESTHESIA WITH STEROID INJECTION;  Surgeon: Corky Mull, MD;  Location: [place];  Service: Orthopedics;  Laterality: Left;    TONSILLECTOMY      There were no vitals filed for this visit.   Subjective Assessment - 05/04/22 1053     Subjective Pt denying pain currently. Unable to really report quality of pain since last session. Hard to tell how her motion has been doing in her L shoulder/elbow. Caregiver reports pt less frequently verbalizing pain in home setting since beginning PT.    Pertinent History Per PA Cameron Proud on 02/25/22:  Shannon Donovan is a 71 y.o. female who presents for follow-up of her left elbow pain secondary to degenerative joint disease. The patient was last seen for these symptoms 3 months ago. At this visit, she received a steroid injection which she states provided mild relief of her symptoms, lasting several weeks before her symptoms began to recur. She again notes moderate-severe pain in her elbow which she rates at 8/10 on today's visit. Her symptoms are aggravated by repetitive use. She has pain at night as well. She has been taking Tylenol and applying ice as necessary with limited benefit. She denies any reinjury to the elbow, and denies any numbness or paresthesias down her arm to her hand. She presents today requesting a repeat injection into the left elbow.    The patient also returns for follow-up of her left shoulder pain. The patient is now 3.5 years status post a manipulation under anesthesia with steroid injection of her left shoulder for secondary adhesive capsulitis following a left shoulder arthroscopy debridement and repair of a partial-thickness rotator cuff tear. The patient was last seen for these symptoms 3 years ago. She has noted increased pain in her shoulder over the past few months, as well as continued limitation in shoulder motion which is unchanged as compared to prior visits. She is unable to raise her arm even to shoulder  level and has difficulty when trying to reach behind her back. She also has pain at night. She denies any reinjury to the shoulder, and denies any fevers or chills. She presents today requesting an injection into the left shoulder.  Pt present to PT today unable to describe type of pain. L elbow recorded at 2-3/10 NPS. Worst pain recorded upwarrds to 7-8/10 NPS. No shoulder pain at rest, worst shoulder pain recorded at up to 8/10 NPS. L elbow pain worsened with movement, no pain with gripping tasks or manipulation tasks but does require use of BUE's to assist. Pt reports elbow pain located at whole elbow recorded as deep. Pt reports improvement in elbow pain with rest and provided support with arm at her tummy improves pain. Pt has pain in L shoulder with motion but is globally very restricted in her overhead motion. Pt reports lateral L shoulder pain and deep . Pt's biggest goals are to improve pain and L arm mobility.    Limitations Lifting;House hold activities    Patient Stated Goals Improve pain and L shoulder/elbow mobility so she doesnt have to rely on RUE so much    Currently in Pain? No/denies    Pain Onset More than a month ago    Pain Onset More than a month ago               Manual Therapy: 25 minutes with pt in supine for improved L elbow/shoulder pain and mobility  Moist heat provided on bicep muscle group throughout manual interventions for improved tissue extensibility for improving L elbow extension AROM   Supination stretch with elbow propped on towel: 3x45 sec with PT overpressure   Elbow extension stretch with VC's maintaining available supinated forearm for further stretch to biceps: 3x45 sec   Contract/relax to elbow flexor and extensor groups in available ranges of motion for reciprocal inhibition to improve L elbow AROM. X10 reps in varying ranges of motion/plane   AP/PA radial head grade 3 joint mobs for improved pronation/supination AROM. 5 reps/plane with 10 sec bouts  per rep  L elbow AROM post manual intervention as compared from  eval numbers: 10-108 degrees.     There.ex:  Nustep seat 7 UE 11 82mns no resistance for gentle AAROM of L shoulder/ elbow; cuing to use RUE to move LUE Supine shoulder abd with max elbow ext and forearm supination available x12 requiring AAROM from PT Supine AAROM dowel BUE chest press 2x6 with max cuing for available elbow ext Seated AAROM pulleys for L shoulder flexion: x20. Education provided on trialing pulley's at home for HEP as pt reports having pulley's.        PT Education - 05/04/22 1054     Education Details form/technique with exercise    Person(s) Educated Patient    Methods Explanation;Demonstration;Tactile cues;Verbal cues    Comprehension Verbalized understanding;Returned demonstration;Verbal cues required              PT Short Term Goals - 04/08/22 1244       PT SHORT TERM GOAL #1   Title Pt will be independent with HEP to improve pain/mobility of L elbow and shoulder for ADL completion    Baseline 04/08/22: Initiated    Time 4    Period Weeks    Status New    Target Date 05/06/22               PT Long Term Goals - 05/04/22 1150       PT LONG TERM GOAL #1   Title Pt will increase FOTO to target score to demonstrate clinically significant improvement in functional mobility    Baseline 04/08/22: Deferred to next session; collected on 04/29/22: 30 with target of 56    Time 8    Period Weeks    Status New    Target Date 06/03/22      PT LONG TERM GOAL #2   Title Pt will improve L shoulder AROM in flexion  > 10 deg to demonstrate clinically significant improvement and ability to perform overhead/ in front ADL's.    Baseline 04/08/22: 47 deg flex, 55 deg abduction    Time 8    Period Weeks    Status New    Target Date 06/03/22      PT LONG TERM GOAL #3   Title Pt will improve L elbow AROM in flexion and extension by at least 5 deg/direction to assist in LUE ADL's such as  dressing/feeding    Baseline 04/08/22: 25-90 deg    Time 8    Period Weeks    Status New    Target Date 06/03/22      PT LONG TERM GOAL #4   Title Pt will report < 6/10 NPS as worst pain in L shoulder and elbow with LUE tasks to demonstrate clinically significant reduction in pain levels with ADL's.    Baseline 04/08/22: Up to 8/10 NPS    Time 8    Period Weeks    Status New    Target Date 06/03/22                   Plan - 05/04/22 1146     Clinical Impression Statement PT POC maintained with focus on manual interventions to improve mobility in L elbow and shoulder. Pt significantly improving L elbow AROM compared to eval numbers with pt caregiver reporting less verbalizations of pain at home setting using LUE with ADL's. Pt continuing to require AAROM and gravity reduced positions due to significantly limited L shoulder AROM. Education provided on pulleys at home to assist in flexion AROM with pt verbalizing understanding. Pt  with significant history of L shoulder impairments that are chronic in nature so unsure of potential prognosis of L shoulder mobility in long run but pt demonstarting less pain with pulleys compared to supine with dowel. Pt will continue to benefit from skilled PT to progress LUE pain free mobility to pt tolerance.    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   7/11    Essential hypertension, benign    History of chicken pox    Hyperplastic colon polyp 09/05/2016    OSA on CPAP 06/01/2020    Other and unspecified hyperlipidemia     Rotator cuff tendinitis, left 12/22/2017    Tubular adenoma of colon 09/05/2016    Vascular dementia without behavioral disturbance (CMS-HCC) 05/27/2020    Examination-Activity Limitations Bathing;Hygiene/Grooming;Bed Mobility;Caring for Others;Reach Overhead;Self Feeding;Carry;Dressing    Examination-Participation Restrictions Church;Cleaning;Shop;Community Activity;Meal Prep;Driving    Stability/Clinical Decision Making Evolving/Moderate complexity    Rehab Potential Fair    PT Frequency 2x / week    PT Duration 8 weeks    PT Treatment/Interventions ADLs/Self Care Home Management;Electrical Stimulation;Aquatic Therapy;Moist Heat;Cryotherapy;Traction;Functional mobility training;Therapeutic activities;Patient/family education;Therapeutic exercise;Neuromuscular re-education;Manual techniques;Dry needling;Passive range of motion;Spinal Manipulations;Joint Manipulations    PT Next Visit Plan continue POC; reassess L shoulder AROM since elbow is improving    PT Home Exercise Plan Access Code: [email or web address]    Consulted and Agree with Plan of Care Patient;Family member/caregiver    Family Member Consulted Caregiver             Patient will benefit from skilled therapeutic intervention in order to improve the following deficits and impairments:  Pain, Improper body mechanics, Decreased mobility, Increased muscle spasms, Postural dysfunction, Decreased activity tolerance, Decreased range of motion, Decreased strength, Impaired flexibility  Visit Diagnosis: Abnormal posture  Chronic left shoulder pain  Pain in left elbow     Problem List Patient Active Problem List   Diagnosis Date Noted   Left rotator cuff tear 02/08/2018    Salem Caster. Fairly IV, PT, DPT Physical Therapist- [facility]  05/04/2022, 11:51 AM  [place] [facility] [address] [place], [address] Phone: [phone]   Fax:   [phone]  Name: Shannon Donovan MRN: [id number] Date of Birth: 02-Nov-1951

## 2022-05-06 ENCOUNTER — Ambulatory Visit: Payer: Medicare Other | Admitting: Physical Therapy

## 2022-05-06 ENCOUNTER — Encounter: Payer: Self-pay | Admitting: Physical Therapy

## 2022-05-06 DIAGNOSIS — R293 Abnormal posture: Secondary | ICD-10-CM | POA: Diagnosis not present

## 2022-05-06 DIAGNOSIS — G8929 Other chronic pain: Secondary | ICD-10-CM

## 2022-05-06 DIAGNOSIS — M25522 Pain in left elbow: Secondary | ICD-10-CM

## 2022-05-11 ENCOUNTER — Encounter: Payer: Self-pay | Admitting: Physical Therapy

## 2022-05-11 ENCOUNTER — Ambulatory Visit: Payer: Medicare Other | Admitting: Physical Therapy

## 2022-05-11 DIAGNOSIS — R293 Abnormal posture: Secondary | ICD-10-CM | POA: Diagnosis not present

## 2022-05-11 DIAGNOSIS — G8929 Other chronic pain: Secondary | ICD-10-CM

## 2022-05-11 DIAGNOSIS — M25522 Pain in left elbow: Secondary | ICD-10-CM

## 2022-05-11 NOTE — Therapy (Signed)
[facility] [address], [place], [address] Phone: [phone]   Fax:  [phone]  Physical Therapy Treatment  Patient Details  Name: Shannon Donovan MRN: [id number] Date of Birth: 1951-01-22 No data recorded  Encounter Date: 05/11/2022   PT End of Session - 05/11/22 1130     Visit Number 7    Number of Visits 17    Date for PT Re-Evaluation 06/03/22    Authorization - Visit Number 7    Progress Note Due on Visit 10    PT Start Time 6644    PT Stop Time 0347    PT Time Calculation (min) 41 min    Activity Tolerance Patient tolerated treatment well    Behavior During Therapy [facility] for tasks assessed/performed             Past Medical History:  Diagnosis Date   Adolescent scoliosis    Anxiety    Atrial fibrillation ([place])    Dysrhythmia    A fib   History of chicken pox    Hyperlipidemia    Hyperplastic colon polyp 09/05/2016   Tubular adenoma of colon   Hypertension    benign/ controlled   Short-term memory loss    Shoulder tendinitis    LEFT upper biceps tendonitis, rotator cuff tendinitis   Stroke ([place]) 42/5956   embolic CVA/ short term memory loss    Past Surgical History:  Procedure Laterality Date   BACK SURGERY     x2 at age 46 and 12 for scoliosis   COLONOSCOPY N/A 09/05/2016   Procedure: COLONOSCOPY;  Surgeon: Lollie Sails, MD;  Location: [facility] ENDOSCOPY;  Service: Endoscopy;  Laterality: N/A;  Pt on Coumadin (PT/ INR)   SHOULDER ARTHROSCOPY WITH OPEN ROTATOR CUFF REPAIR Left 02/08/2018   Procedure: SHOULDER ARTHROSCOPY WITH OPEN ROTATOR CUFF REPAIR;  Surgeon: Corky Mull, MD;  Location: [facility] ORS;  Service: Orthopedics;  Laterality: Left;   SHOULDER CLOSED REDUCTION Left 05/09/2018   Procedure: CLOSED MANIPULATION SHOULDER  UNDER ANESTHESIA WITH STEROID INJECTION;  Surgeon: Corky Mull, MD;  Location: [place];  Service: Orthopedics;  Laterality: Left;    TONSILLECTOMY      There were no vitals filed for this visit.   Subjective Assessment - 05/11/22 1050     Subjective Pt reports 8/10 currently. Patient reports completion of HEP and is trying to not guard with elbow as much.    Pertinent History Per PA Cameron Proud on 02/25/22:                                                                                                                                                                Shannon Donovan is a 71 y.o. female  who presents for follow-up of her left elbow pain secondary to degenerative joint disease. The patient was last seen for these symptoms 3 months ago. At this visit, she received a steroid injection which she states provided mild relief of her symptoms, lasting several weeks before her symptoms began to recur. She again notes moderate-severe pain in her elbow which she rates at 8/10 on today's visit. Her symptoms are aggravated by repetitive use. She has pain at night as well. She has been taking Tylenol and applying ice as necessary with limited benefit. She denies any reinjury to the elbow, and denies any numbness or paresthesias down her arm to her hand. She presents today requesting a repeat injection into the left elbow.    The patient also returns for follow-up of her left shoulder pain. The patient is now 3.5 years status post a manipulation under anesthesia with steroid injection of her left shoulder for secondary adhesive capsulitis following a left shoulder arthroscopy debridement and repair of a partial-thickness rotator cuff tear. The patient was last seen for these symptoms 3 years ago. She has noted increased pain in her shoulder over the past few months, as well as continued limitation in shoulder motion which is unchanged as compared to prior visits. She is unable to raise her arm even to shoulder level and has difficulty when trying to reach behind her back. She also has pain at night. She denies any reinjury to the shoulder, and  denies any fevers or chills. She presents today requesting an injection into the left shoulder.  Pt present to PT today unable to describe type of pain. L elbow recorded at 2-3/10 NPS. Worst pain recorded upwarrds to 7-8/10 NPS. No shoulder pain at rest, worst shoulder pain recorded at up to 8/10 NPS. L elbow pain worsened with movement, no pain with gripping tasks or manipulation tasks but does require use of BUE's to assist. Pt reports elbow pain located at whole elbow recorded as deep. Pt reports improvement in elbow pain with rest and provided support with arm at her tummy improves pain. Pt has pain in L shoulder with motion but is globally very restricted in her overhead motion. Pt reports lateral L shoulder pain and deep . Pt's biggest goals are to improve pain and L arm mobility.    Limitations Lifting;House hold activities    Patient Stated Goals Improve pain and L shoulder/elbow mobility so she doesnt have to rely on RUE so much    Pain Onset More than a month ago                Ther-Ex Nustep seat 7 UE 11 23mns no resistance for gentle AAROM of L shoulder elbow; cuing to use RUE to move L Supinated with elbow ext, in approx 60d abd 113m (supine) Supine AAROM dowel skull crusher BUE x12 max cuing for maintaining upper arm position  Standing eccentric bicep curl 1# x12; 2# x10 with consistent cuing for maintained supinated position, breif hold in max ext, end phase of exercise L elbow ext stretch at door corner 57m62m  Manual         STM with trigger point release to bicep muscle belly, pronator teres, and tricep group alt heat to area not being worked on; maintained elbow ext and supination to available range with overpressure into flex and ext throughout  (W/ heat applied to bicep) Radial + ulnar PA mob G1 2x 30sec for pain modulation progressed to G3 2x 30sec bout for increased  mobility; ulnar to radial glide with supination movement x51mn  In supination stretch overpressure with  heat on tricep group                        PT Education - 05/11/22 1325     Education Details therex fomr/technique    Person(s) Educated Patient    Methods Demonstration;Explanation;Verbal cues    Comprehension Verbalized understanding;Returned demonstration;Verbal cues required              PT Short Term Goals - 04/08/22 1244       PT SHORT TERM GOAL #1   Title Pt will be independent with HEP to improve pain/mobility of L elbow and shoulder for ADL completion    Baseline 04/08/22: Initiated    Time 4    Period Weeks    Status New    Target Date 05/06/22               PT Long Term Goals - 05/04/22 1150       PT LONG TERM GOAL #1   Title Pt will increase FOTO to target score to demonstrate clinically significant improvement in functional mobility    Baseline 04/08/22: Deferred to next session; collected on 04/29/22: 30 with target of 56    Time 8    Period Weeks    Status New    Target Date 06/03/22      PT LONG TERM GOAL #2   Title Pt will improve L shoulder AROM in flexion  > 10 deg to demonstrate clinically significant improvement and ability to perform overhead/ in front ADL's.    Baseline 04/08/22: 47 deg flex, 55 deg abduction    Time 8    Period Weeks    Status New    Target Date 06/03/22      PT LONG TERM GOAL #3   Title Pt will improve L elbow AROM in flexion and extension by at least 5 deg/direction to assist in LUE ADL's such as dressing/feeding    Baseline 04/08/22: 25-90 deg    Time 8    Period Weeks    Status New    Target Date 06/03/22      PT LONG TERM GOAL #4   Title Pt will report < 6/10 NPS as worst pain in L shoulder and elbow with LUE tasks to demonstrate clinically significant reduction in pain levels with ADL's.    Baseline 04/08/22: Up to 8/10 NPS    Time 8    Period Weeks    Status New    Target Date 06/03/22                   Plan - 05/11/22 1131     Clinical Impression Statement PT continued  manual techniques for increased mobility, with progression of therex for this as well. Cuing for proper technique of therex, with patient with difficulty maintaining supination with elbow ext. Patient reports some increased pain with manual techniques, PT    Personal Factors and Comorbidities Age;Comorbidity 3+;Education;Past/Current Experience;Time since onset of injury/illness/exacerbation             Patient will benefit from skilled therapeutic intervention in order to improve the following deficits and impairments:     Visit Diagnosis: Abnormal posture  Chronic left shoulder pain  Pain in left elbow     Problem List Patient Active Problem List   Diagnosis Date Noted   Left rotator cuff tear 02/08/2018  Durwin Reges DPT Durwin Reges, PT 05/11/2022, 1:26 PM  [place] [facility] [address], [place], [address] Phone: [phone]   Fax:  [phone]  Name: Shannon Donovan MRN: [id number] Date of Birth: 08-Mar-1951

## 2022-05-13 ENCOUNTER — Encounter: Payer: Self-pay | Admitting: Physical Therapy

## 2022-05-13 ENCOUNTER — Ambulatory Visit: Payer: Medicare Other | Admitting: Physical Therapy

## 2022-05-13 DIAGNOSIS — R293 Abnormal posture: Secondary | ICD-10-CM

## 2022-05-13 DIAGNOSIS — M25522 Pain in left elbow: Secondary | ICD-10-CM

## 2022-05-13 DIAGNOSIS — G8929 Other chronic pain: Secondary | ICD-10-CM

## 2022-05-13 NOTE — Therapy (Signed)
[facility] [address] [place], [place], [address] Phone: [phone]   Fax:  [phone]  Physical Therapy Treatment  Patient Details  Name: Shannon Donovan MRN: [id number] Date of Birth: [birth year] No data recorded  Encounter Date: 05/13/2022   PT End of Session - 05/13/22 1113     Visit Number 8    Number of Visits 17    Date for PT Re-Evaluation 06/03/22    Authorization - Visit Number 8    Progress Note Due on Visit 10    PT Start Time 8466    PT Stop Time 1213    PT Time Calculation (min) 38 min    Activity Tolerance Patient tolerated treatment well    Behavior During Therapy Shannon Donovan for tasks assessed/performed             Past Medical History:  Diagnosis Date   Adolescent scoliosis    Anxiety    Atrial fibrillation ([place])    Dysrhythmia    A fib   History of chicken pox    Hyperlipidemia    Hyperplastic colon polyp 09/05/2016   Tubular adenoma of colon   Hypertension    benign/ controlled   Short-term memory loss    Shoulder tendinitis    LEFT upper biceps tendonitis, rotator cuff tendinitis   Stroke ([place]) 59/9357   embolic CVA/ short term memory loss    Past Surgical History:  Procedure Laterality Date   BACK SURGERY     x2 at age 14 and 76 for scoliosis   COLONOSCOPY N/A 09/05/2016   Procedure: COLONOSCOPY;  Surgeon: Lollie Sails, MD;  Location: [facility] ENDOSCOPY;  Service: Endoscopy;  Laterality: N/A;  Pt on Coumadin (PT/ INR)   SHOULDER ARTHROSCOPY WITH OPEN ROTATOR CUFF REPAIR Left 02/08/2018   Procedure: SHOULDER ARTHROSCOPY WITH OPEN ROTATOR CUFF REPAIR;  Surgeon: Corky Mull, MD;  Location: [facility] ORS;  Service: Orthopedics;  Laterality: Left;   SHOULDER CLOSED REDUCTION Left 05/09/2018   Procedure: CLOSED MANIPULATION SHOULDER  UNDER ANESTHESIA WITH STEROID INJECTION;  Surgeon: Corky Mull, MD;  Location: [place];  Service: Orthopedics;  Laterality: Left;    TONSILLECTOMY      There were no vitals filed for this visit.   Subjective Assessment - 05/13/22 1139     Subjective Pt reports current 4/10 pain in elbow. Patient reports having more difficulty this am that she has been "rushing around". Sh eis trying to do her HEP.    Pertinent History Per PA Shannon Donovan on 02/25/22:  Shannon Donovan is a 71 y.o. female who presents for follow-up of her left elbow pain secondary to degenerative joint disease. The patient was last seen for these symptoms 3 months ago. At this visit, she received a steroid injection which she states provided mild relief of her symptoms, lasting several weeks before her symptoms began to recur. She again notes moderate-severe pain in her elbow which she rates at 8/10 on today's visit. Her symptoms are aggravated by repetitive use. She has pain at night as well. She has been taking Tylenol and applying ice as necessary with limited benefit. She denies any reinjury to the elbow, and denies any numbness or paresthesias down her arm to her hand. She presents today requesting a repeat injection into the left elbow.    The patient also returns for follow-up of her left shoulder pain. The patient is now 3.5 years status post a manipulation under anesthesia with steroid injection of her left shoulder for secondary adhesive capsulitis following a left shoulder arthroscopy debridement and repair of a partial-thickness rotator cuff tear. The patient was last seen for these symptoms 3 years ago. She has noted increased pain in her shoulder over the past few months, as well as continued limitation in shoulder motion which is unchanged as compared to prior visits. She is unable to raise her arm even to shoulder level and has difficulty when trying to reach behind her back. She also has pain at night.  She denies any reinjury to the shoulder, and denies any fevers or chills. She presents today requesting an injection into the left shoulder.  Pt present to PT today unable to describe type of pain. L elbow recorded at 2-3/10 NPS. Worst pain recorded upwarrds to 7-8/10 NPS. No shoulder pain at rest, worst shoulder pain recorded at up to 8/10 NPS. L elbow pain worsened with movement, no pain with gripping tasks or manipulation tasks but does require use of BUE's to assist. Pt reports elbow pain located at whole elbow recorded as deep. Pt reports improvement in elbow pain with rest and provided support with arm at her tummy improves pain. Pt has pain in L shoulder with motion but is globally very restricted in her overhead motion. Pt reports lateral L shoulder pain and deep . Pt's biggest goals are to improve pain and L arm mobility.    Limitations Lifting;House hold activities    Patient Stated Goals Improve pain and L shoulder/elbow mobility so she doesnt have to rely on RUE so much    Pain Onset More than a month ago    Pain Onset More than a month ago            Ther-Ex Nustep seat 7 UE 11 51mns no resistance for gentle AAROM of L shoulder elbow; cuing to use RUE to move L Supine AAROM dowel skull crusher BUE x12 max cuing for maintaining upper arm position  Standing eccentric bicep curl OMEGA attached at bottom of rack 2x 5 5# with cuing for eccentric control  L elbow ext stretch at door corner 176m   Manual         STM with trigger point release to bicep muscle belly, pronator teres, and tricep group alt heat to area not being worked on from bicep and forearm; maintained elbow ext and supination to available range with overpressure into flex and ext throughout  (W/ heat applied to tricep group) Radial + ulnar PA mob G3 3x 30sec bout for with movement increased extensor mobility;  humeral PA mob G3 3x 30sec bout for with movement increased flexion mobility; ulnar to radial glide grade 3with  supination movement x75mn  In supination stretch overpressure with heat on tricep group                            PT Education - 05/13/22 1112     Education Details therex form/technique    Person(s) Educated Patient    Methods Explanation;Demonstration;Verbal cues    Comprehension Verbalized understanding;Returned demonstration;Verbal cues required              PT Short Term Goals - 04/08/22 1244       PT SHORT TERM GOAL #1   Title Pt will be independent with HEP to improve pain/mobility of L elbow and shoulder for ADL completion    Baseline 04/08/22: Initiated    Time 4    Period Weeks    Status New    Target Date 05/06/22               PT Long Term Goals - 05/04/22 1150       PT LONG TERM GOAL #1   Title Pt will increase FOTO to target score to demonstrate clinically significant improvement in functional mobility    Baseline 04/08/22: Deferred to next session; collected on 04/29/22: 30 with target of 56    Time 8    Period Weeks    Status New    Target Date 06/03/22      PT LONG TERM GOAL #2   Title Pt will improve L shoulder AROM in flexion  > 10 deg to demonstrate clinically significant improvement and ability to perform overhead/ in front ADL's.    Baseline 04/08/22: 47 deg flex, 55 deg abduction    Time 8    Period Weeks    Status New    Target Date 06/03/22      PT LONG TERM GOAL #3   Title Pt will improve L elbow AROM in flexion and extension by at least 5 deg/direction to assist in LUE ADL's such as dressing/feeding    Baseline 04/08/22: 25-90 deg    Time 8    Period Weeks    Status New    Target Date 06/03/22      PT LONG TERM GOAL #4   Title Pt will report < 6/10 NPS as worst pain in L shoulder and elbow with LUE tasks to demonstrate clinically significant reduction in pain levels with ADL's.    Baseline 04/08/22: Up to 8/10 NPS    Time 8    Period Weeks    Status New    Target Date 06/03/22                    Plan - 05/13/22 1138     Clinical Impression Statement Session shortened d/t patient arriving late. PT continued manual techniques to relieve tension and pain with continued increased ROM from evaluation. PT continued therex progression for increased mobility with patient able to comply with proper technique of therex with demo and cuing. Shannon Donovan tmotivated throughout session without increased pain. PT will continue progression as able.    Personal Factors and Comorbidities Age;Comorbidity 3+;Education;Past/Current Experience;Time since onset of injury/illness/exacerbation    Comorbidities Adolescent scoliosis    Adult idiopathic generalized osteoporosis 05/02/2019   BD 1/20, Fosamax started 1/20    Allergic state   Penicillin, Latex    Anxiety   Not  bad    Arthritis Left upper arm   Cortisone shot helps for a short while    Atrial fibrillation (427.31) 03/26/2014    Cerebrovascular accident, embolic (CMS-HCC) 06/22/8915   7/11    Essential hypertension, benign    History of chicken pox    Hyperplastic colon polyp 09/05/2016    OSA on CPAP 06/01/2020    Other and unspecified hyperlipidemia    Rotator cuff tendinitis, left 12/22/2017    Tubular adenoma of colon 09/05/2016    Vascular dementia without behavioral disturbance (CMS-HCC) 05/27/2020    Examination-Activity Limitations Bathing;Hygiene/Grooming;Bed Mobility;Caring for Others;Reach Overhead;Self Feeding;Carry;Dressing    Examination-Participation Restrictions Church;Cleaning;Shop;Community Activity;Meal Prep;Driving    Stability/Clinical Decision Making Evolving/Moderate complexity    Clinical Decision Making Moderate    Rehab Potential Fair    PT Frequency 2x / week    PT Duration 8 weeks    PT Treatment/Interventions ADLs/Self Care Home Management;Electrical Stimulation;Aquatic Therapy;Moist Heat;Cryotherapy;Traction;Functional mobility training;Therapeutic activities;Patient/family education;Therapeutic exercise;Neuromuscular  re-education;Manual techniques;Dry needling;Passive range of motion;Spinal Manipulations;Joint Manipulations    PT Next Visit Plan continue POC    PT Home Exercise Plan Access Code: [email or web address]    Consulted and Agree with Plan of Care Patient;Family member/caregiver    Family Member Consulted Caregiver             Patient will benefit from skilled therapeutic intervention in order to improve the following deficits and impairments:  Pain, Improper body mechanics, Decreased mobility, Increased muscle spasms, Postural dysfunction, Decreased activity tolerance, Decreased range of motion, Decreased strength, Impaired flexibility  Visit Diagnosis: Abnormal posture  Chronic left shoulder pain  Pain in left elbow     Problem List Patient Active Problem List   Diagnosis Date Noted   Left rotator cuff tear 02/08/2018   Shannon Donovan DPT Shannon Donovan, PT 05/13/2022, 12:13 PM  [facility] [address], [place], [address] Phone: [phone]   Fax:  [phone]  Name: Shannon Donovan MRN: [id number] Date of Birth: October 08, 1951

## 2022-05-16 ENCOUNTER — Ambulatory Visit: Payer: Medicare Other | Attending: Student

## 2022-05-16 DIAGNOSIS — R278 Other lack of coordination: Secondary | ICD-10-CM | POA: Diagnosis present

## 2022-05-16 DIAGNOSIS — R269 Unspecified abnormalities of gait and mobility: Secondary | ICD-10-CM | POA: Insufficient documentation

## 2022-05-16 DIAGNOSIS — R2681 Unsteadiness on feet: Secondary | ICD-10-CM | POA: Insufficient documentation

## 2022-05-16 DIAGNOSIS — R293 Abnormal posture: Secondary | ICD-10-CM | POA: Insufficient documentation

## 2022-05-16 DIAGNOSIS — M6281 Muscle weakness (generalized): Secondary | ICD-10-CM | POA: Insufficient documentation

## 2022-05-16 DIAGNOSIS — G8929 Other chronic pain: Secondary | ICD-10-CM | POA: Diagnosis present

## 2022-05-16 DIAGNOSIS — R2689 Other abnormalities of gait and mobility: Secondary | ICD-10-CM | POA: Insufficient documentation

## 2022-05-16 DIAGNOSIS — M25512 Pain in left shoulder: Secondary | ICD-10-CM | POA: Insufficient documentation

## 2022-05-16 DIAGNOSIS — R262 Difficulty in walking, not elsewhere classified: Secondary | ICD-10-CM | POA: Diagnosis present

## 2022-05-16 DIAGNOSIS — M25522 Pain in left elbow: Secondary | ICD-10-CM | POA: Diagnosis present

## 2022-05-16 NOTE — Therapy (Signed)
[place] [facility] [address], [place], [address] Phone: [phone]   Fax:  [phone]  Physical Therapy Treatment  Patient Details  Name: Shannon Donovan MRN: [id number] Date of Birth: Nov 24, 1950 No data recorded  Encounter Date: 05/16/2022   PT End of Session - 05/16/22 1323     Visit Number 9    Number of Visits 17    Date for PT Re-Evaluation 06/03/22    Authorization Type Medicare (traditional)    Authorization Time Period 04/08/22-06/03/22    Progress Note Due on Visit 10    PT Start Time 1304    PT Stop Time 1344    PT Time Calculation (min) 40 min    Activity Tolerance Patient tolerated treatment well    Behavior During Therapy [facility] for tasks assessed/performed             Past Medical History:  Diagnosis Date   Adolescent scoliosis    Anxiety    Atrial fibrillation ([place])    Dysrhythmia    A fib   History of chicken pox    Hyperlipidemia    Hyperplastic colon polyp 09/05/2016   Tubular adenoma of colon   Hypertension    benign/ controlled   Short-term memory loss    Shoulder tendinitis    LEFT upper biceps tendonitis, rotator cuff tendinitis   Stroke ([place]) 72/5366   embolic CVA/ short term memory loss    Past Surgical History:  Procedure Laterality Date   BACK SURGERY     x2 at age 57 and 43 for scoliosis   COLONOSCOPY N/A 09/05/2016   Procedure: COLONOSCOPY;  Surgeon: Lollie Sails, MD;  Location: [facility] ENDOSCOPY;  Service: Endoscopy;  Laterality: N/A;  Pt on Coumadin (PT/ INR)   SHOULDER ARTHROSCOPY WITH OPEN ROTATOR CUFF REPAIR Left 02/08/2018   Procedure: SHOULDER ARTHROSCOPY WITH OPEN ROTATOR CUFF REPAIR;  Surgeon: Corky Mull, MD;  Location: [facility] ORS;  Service: Orthopedics;  Laterality: Left;   SHOULDER CLOSED REDUCTION Left 05/09/2018   Procedure: CLOSED MANIPULATION SHOULDER  UNDER ANESTHESIA WITH STEROID INJECTION;  Surgeon: Corky Mull, MD;  Location: [facility];  Service: Orthopedics;  Laterality: Left;   TONSILLECTOMY      There were no vitals filed for this visit.   Subjective Assessment - 05/16/22 1310     Subjective Pt doing well today no updates since last session. Pt has been working on HEP at home. She has no pain on arrival.    Pertinent History Shannon Donovan is a 71yoF who comes to [facility] for recurrent pain at LLE elbow and shoulder. Pt followed by orthopedics for this issue ongoing. Pt has history of Left shoulder RC repair, frozen shoulder, manipulation under anesthesia, and multiple injections.    Patient Stated Goals Improve pain and L shoulder/elbow mobility so she doesnt have to rely on RUE so much    Currently in Pain? No/denies             INTERVENTION THIS DATE:  -Nustep 6 minutes- seat 6 UE ~10, pillow behind back, 5 mins level (AA/ROM LUE)  -Supine Left elbow flexion stretch 1x60sec  -Supine Left elbow /flexion A/ROM 1x20 -Supine Left elbow flexion stretch 1x60sec  -Supine Left elbow /flexion A/ROM 1x20 -Left elbow supination, pronation A/ROM in 90 degrees elbow flexion 1x20 each (tactile cues needed) -Left GHJ IR/ER A/ROM 1x15 in  -isometric row into towel roll 15*3secH  -Left GHJ IR/ER A/ROM 1x15 in  -  Supine Left elbow/flexion  1lb FW 1x15 -isometric yellow butter ball IR 10x3secH -supine chest press 2x5 c yellow ball      PT Education - 05/16/22 1330     Education Details tactile cues for single plane movements    Person(s) Educated Patient    Methods Explanation    Comprehension Verbalized understanding;Returned demonstration              PT Short Term Goals - 04/08/22 1244       PT SHORT TERM GOAL #1   Title Pt will be independent with HEP to improve pain/mobility of L elbow and shoulder for ADL completion    Baseline 04/08/22: Initiated    Time 4    Period Weeks    Status New    Target Date 05/06/22               PT Long Term Goals - 05/04/22 1150       PT LONG TERM GOAL #1    Title Pt will increase FOTO to target score to demonstrate clinically significant improvement in functional mobility    Baseline 04/08/22: Deferred to next session; collected on 04/29/22: 30 with target of 56    Time 8    Period Weeks    Status New    Target Date 06/03/22      PT LONG TERM GOAL #2   Title Pt will improve L shoulder AROM in flexion  > 10 deg to demonstrate clinically significant improvement and ability to perform overhead/ in front ADL's.    Baseline 04/08/22: 47 deg flex, 55 deg abduction    Time 8    Period Weeks    Status New    Target Date 06/03/22      PT LONG TERM GOAL #3   Title Pt will improve L elbow AROM in flexion and extension by at least 5 deg/direction to assist in LUE ADL's such as dressing/feeding    Baseline 04/08/22: 25-90 deg    Time 8    Period Weeks    Status New    Target Date 06/03/22      PT LONG TERM GOAL #4   Title Pt will report < 6/10 NPS as worst pain in L shoulder and elbow with LUE tasks to demonstrate clinically significant reduction in pain levels with ADL's.    Baseline 04/08/22: Up to 8/10 NPS    Time 8    Period Weeks    Status New    Target Date 06/03/22                   Plan - 05/16/22 1331     Clinical Impression Statement Continued with basic ROM and activation as tolerated ROM remains somewhat limited, pain easily provoked. Pt needs extensiv ecues at times to break movement compensation and achieve single plane movements. Pt remains motivated and attempt to put forth good effort overall.    Personal Factors and Comorbidities Age;Comorbidity 3+;Education;Past/Current Experience;Time since onset of injury/illness/exacerbation    Comorbidities Adolescent scoliosis    Adult idiopathic generalized osteoporosis 05/02/2019   BD 1/20, Fosamax started 1/20    Allergic state   Penicillin, Latex    Anxiety   Not bad    Arthritis Left upper arm   Cortisone shot helps for a short while    Atrial fibrillation (427.31) 03/26/2014     Cerebrovascular accident, embolic (CMS-HCC) 03/15/8412   7/11    Essential hypertension, benign    History of chicken  pox    Hyperplastic colon polyp 09/05/2016    OSA on CPAP 06/01/2020    Other and unspecified hyperlipidemia    Rotator cuff tendinitis, left 12/22/2017    Tubular adenoma of colon 09/05/2016    Vascular dementia without behavioral disturbance (CMS-HCC) 05/27/2020    Examination-Activity Limitations Bathing;Hygiene/Grooming;Bed Mobility;Caring for Others;Reach Overhead;Self Feeding;Carry;Dressing    Examination-Participation Restrictions Church;Cleaning;Shop;Community Activity;Meal Prep;Driving    Stability/Clinical Decision Making Evolving/Moderate complexity    Clinical Decision Making Moderate    Rehab Potential Fair    PT Frequency 2x / week    PT Duration 8 weeks    PT Treatment/Interventions ADLs/Self Care Home Management;Electrical Stimulation;Aquatic Therapy;Moist Heat;Cryotherapy;Traction;Functional mobility training;Therapeutic activities;Patient/family education;Therapeutic exercise;Neuromuscular re-education;Manual techniques;Dry needling;Passive range of motion;Spinal Manipulations;Joint Manipulations    PT Next Visit Plan continue POC    PT Home Exercise Plan Access Code: [email or web address]    Consulted and Agree with Plan of Care Patient;Family member/caregiver    Family Member Consulted Caregiver- Shannon Donovan             Patient will benefit from skilled therapeutic intervention in order to improve the following deficits and impairments:  Pain, Improper body mechanics, Decreased mobility, Increased muscle spasms, Postural dysfunction, Decreased activity tolerance, Decreased range of motion, Decreased strength, Impaired flexibility  Visit Diagnosis: Abnormal posture  Chronic left shoulder pain  Pain in left elbow  Unsteadiness on feet  Other abnormalities of gait and mobility  Muscle weakness (generalized)  Abnormality of gait and mobility  Difficulty in walking, not  elsewhere classified  Other lack of coordination     Problem List Patient Active Problem List   Diagnosis Date Noted   Left rotator cuff tear 02/08/2018   1:47 PM, 05/16/22 Shannon Donovan, PT, DPT Physical Therapist - [place] [phone] (Office)   [place] C, PT 05/16/2022, 1:44 PM  [place] [facility] [address], [place], [address] Phone: [phone]   Fax:  [phone]  Name: Shannon Donovan MRN: [id number] Date of Birth: 1951-06-11

## 2022-05-18 ENCOUNTER — Encounter: Payer: Medicare Other | Admitting: Physical Therapy

## 2022-05-20 ENCOUNTER — Encounter: Payer: Self-pay | Admitting: Physical Therapy

## 2022-05-20 ENCOUNTER — Ambulatory Visit: Payer: Medicare Other | Admitting: Physical Therapy

## 2022-05-20 DIAGNOSIS — M25522 Pain in left elbow: Secondary | ICD-10-CM

## 2022-05-20 DIAGNOSIS — R293 Abnormal posture: Secondary | ICD-10-CM

## 2022-05-20 DIAGNOSIS — G8929 Other chronic pain: Secondary | ICD-10-CM

## 2022-05-20 NOTE — Therapy (Signed)
[place] [facility] [address], [place], [address] Phone: [phone]   Fax:  [phone]  Physical Therapy Treatment/Progress Note Reporting Period 04/08/22 - 05/20/22  Patient Details  Name: Shannon Donovan MRN: [id number] Date of Birth: May 10, 1951 No data recorded  Encounter Date: 05/20/2022   PT End of Session - 05/20/22 1133     Visit Number 10    Number of Visits 17    Date for PT Re-Evaluation 06/03/22    Authorization Type Medicare (traditional)    Authorization Time Period 04/08/22-06/03/22    Authorization - Visit Number 10    Progress Note Due on Visit 10    PT Start Time 1130    PT Stop Time 1210    PT Time Calculation (min) 40 min    Activity Tolerance Patient tolerated treatment well    Behavior During Therapy [facility] for tasks assessed/performed             Past Medical History:  Diagnosis Date   Adolescent scoliosis    Anxiety    Atrial fibrillation ([place])    Dysrhythmia    A fib   History of chicken pox    Hyperlipidemia    Hyperplastic colon polyp 09/05/2016   Tubular adenoma of colon   Hypertension    benign/ controlled   Short-term memory loss    Shoulder tendinitis    LEFT upper biceps tendonitis, rotator cuff tendinitis   Stroke ([place]) 65/6812   embolic CVA/ short term memory loss    Past Surgical History:  Procedure Laterality Date   BACK SURGERY     x2 at age 60 and 74 for scoliosis   COLONOSCOPY N/A 09/05/2016   Procedure: COLONOSCOPY;  Surgeon: Lollie Sails, MD;  Location: [facility] ENDOSCOPY;  Service: Endoscopy;  Laterality: N/A;  Pt on Coumadin (PT/ INR)   SHOULDER ARTHROSCOPY WITH OPEN ROTATOR CUFF REPAIR Left 02/08/2018   Procedure: SHOULDER ARTHROSCOPY WITH OPEN ROTATOR CUFF REPAIR;  Surgeon: Corky Mull, MD;  Location: [facility] ORS;  Service: Orthopedics;  Laterality: Left;   SHOULDER CLOSED REDUCTION Left 05/09/2018   Procedure: CLOSED MANIPULATION SHOULDER  UNDER  ANESTHESIA WITH STEROID INJECTION;  Surgeon: Corky Mull, MD;  Location: [place];  Service: Orthopedics;  Laterality: Left;   TONSILLECTOMY      There were no vitals filed for this visit.   Subjective Assessment - 05/20/22 1132     Subjective Pt reports she felt good following last PT session. Reports 6/10 pain in the elbow today. Is completing HEP    Pertinent History Shannon Donovan is a 71yoF who comes to [facility] for recurrent pain at LLE elbow and shoulder. Pt followed by orthopedics for this issue ongoing. Pt has history of Left shoulder RC repair, frozen shoulder, manipulation under anesthesia, and multiple injections.    Limitations Lifting;House hold activities    Patient Stated Goals Improve pain and L shoulder/elbow mobility so she doesnt have to rely on RUE so much    Pain Onset More than a month ago    Pain Onset More than a month ago                Ther-Ex Nustep seat 7 UE 11 63mns no resistance for gentle AAROM of L shoulder elbow; cuing to use RUE to move L  Passive elbow ROM 14-120d AROM 22-112  PT reviewed the following HEP with patient with patient able to demonstrate a set of the following  with min cuing for correction needed. PT educated patient on parameters of therex (how/when to inc/decrease intensity, frequency, rep/set range, stretch hold time, and purpose of therex) with verbalized understanding.  Access Code: [email or web address] - Supine Elbow Extension Stretch in Supination  - 3 x daily - 7 x weekly - 1 sets - 5 reps - 30-60sec hold - Standing Bicep Stretch at Wall  - 3 x daily - 7 x weekly - 30-60sec hold - Supine Shoulder Flexion AAROM with Hands Clasped  - 2-3 x daily - 7 x weekly - 15-2020 reps - Supine Elbow Flexion Extension AROM  - 2-3 x daily - 7 x weekly - 15-20 reps  Elbow flex <> ext 2# 2x 8 with cuing for eccentric control and supination with ext with good carry over  Left elbow supination, pronation A/ROM in 90 degrees elbow flexion 1x20  each (tactile cues needed)  Dowel chest press x12  Bicep stretch on wall 45sec                          PT Education - 05/20/22 1133     Education Details therex form/technique    Person(s) Educated Patient    Methods Explanation;Demonstration;Verbal cues    Comprehension Verbal cues required;Returned demonstration;Verbalized understanding              PT Short Term Goals - 05/20/22 1134       PT SHORT TERM GOAL #1   Title Pt will be independent with HEP to improve pain/mobility of L elbow and shoulder for ADL completion    Baseline 04/08/22: Initiated ; 05/20/22 completing regularly without concern    Time 4    Period Weeks    Status Achieved               PT Long Term Goals - 05/20/22 1134       PT LONG TERM GOAL #1   Title Pt will increase FOTO to target score to demonstrate clinically significant improvement in functional mobility    Baseline 04/08/22: Deferred to next session; collected on 04/29/22: 30 with target of 56; 05/20/22 47    Time 8    Period Weeks    Status On-going    Target Date 06/03/22      PT LONG TERM GOAL #2   Title Pt will improve L shoulder AROM in flexion  > 10 deg to demonstrate clinically significant improvement and ability to perform overhead/ in front ADL's.    Baseline 04/08/22: 47 deg flex, 55 deg abduction; 05/20/22 flex: 52d abd: 45d    Time 8    Period Weeks    Status Not Met    Target Date 06/03/22      PT LONG TERM GOAL #3   Title Pt will improve L elbow AROM in flexion and extension by at least 5 deg/direction to assist in LUE ADL's such as dressing/feeding    Baseline 04/08/22: 25-90 deg; 05/20/22 22-112d    Time 8    Period Weeks    Status Partially Met      PT LONG TERM GOAL #4   Title Pt will report < 6/10 NPS as worst pain in L shoulder and elbow with LUE tasks to demonstrate clinically significant reduction in pain levels with ADL's.    Baseline 04/08/22: Up to 8/10 NPS; 05/20/22 8/10    Time 8     Period Weeks    Status On-going  Plan - 05/20/22 1208     Clinical Impression Statement PT reassessed goals this session where patient is making good progress toward goals of elbow mobility and subjective function. 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 PT continued focus of elbow mobility, as this is pertinent to paitent in lieu of shoulder mobility that will most likely be time dependent due to nature of adhesive capsulitis. PT updated HEP to reflect this with pt and caregiver demonstrating and verbalizing undrstanding. Pt is motivated trhoughout session and able to comply with all cuing for proper technique of therex.  PT will continue progression as able.    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   7/11    Essential hypertension, benign    History of chicken pox    Hyperplastic colon polyp 09/05/2016    OSA on CPAP 06/01/2020    Other and unspecified hyperlipidemia    Rotator cuff tendinitis, left 12/22/2017    Tubular adenoma of colon 09/05/2016    Vascular dementia without behavioral disturbance (CMS-HCC) 05/27/2020    Examination-Activity Limitations Bathing;Hygiene/Grooming;Bed Mobility;Caring for Others;Reach Overhead;Self Feeding;Carry;Dressing    Examination-Participation Restrictions Church;Cleaning;Shop;Community Activity;Meal Prep;Driving    Stability/Clinical Decision Making Evolving/Moderate complexity    Clinical Decision  Making Moderate    Rehab Potential Fair    PT Frequency 2x / week    PT Duration 8 weeks    PT Treatment/Interventions ADLs/Self Care Home Management;Electrical Stimulation;Aquatic Therapy;Moist Heat;Cryotherapy;Traction;Functional mobility training;Therapeutic activities;Patient/family education;Therapeutic exercise;Neuromuscular re-education;Manual techniques;Dry needling;Passive range of motion;Spinal Manipulations;Joint Manipulations    PT Next Visit Plan continue POC    PT Home Exercise Plan Access Code: [email or web address]    Consulted and Agree with Plan of Care Patient;Family member/caregiver    Family Member Consulted Caregiver- Izora Gala             Patient will benefit from skilled therapeutic intervention in order to improve the following deficits and impairments:  Pain, Improper body mechanics, Decreased mobility, Increased muscle spasms, Postural dysfunction, Decreased activity tolerance, Decreased range of motion, Decreased strength, Impaired flexibility  Visit Diagnosis: Abnormal posture  Chronic left shoulder pain  Pain in left elbow     Problem List Patient Active Problem List   Diagnosis Date Noted   Left rotator cuff tear 02/08/2018   Durwin Reges DPT Durwin Reges, PT 05/20/2022, 12:13 PM  [place] [facility] [address] [place], [address] Phone: [phone]   Fax:  [phone]  Name: Shannon Donovan MRN: [id number] Date of Birth: 1951/06/27

## 2022-05-23 ENCOUNTER — Ambulatory Visit: Payer: Medicare Other | Admitting: Physical Therapy

## 2022-05-23 ENCOUNTER — Encounter: Payer: Self-pay | Admitting: Physical Therapy

## 2022-05-23 DIAGNOSIS — M25522 Pain in left elbow: Secondary | ICD-10-CM

## 2022-05-23 DIAGNOSIS — R293 Abnormal posture: Secondary | ICD-10-CM | POA: Diagnosis not present

## 2022-05-23 DIAGNOSIS — G8929 Other chronic pain: Secondary | ICD-10-CM

## 2022-05-23 NOTE — Therapy (Signed)
[facility] [address], [place], [address] Phone: [phone]   Fax:  [phone]  Physical Therapy Treatment  Patient Details  Name: Shaleka Brines MRN: [id number] Date of Birth: 1951-08-08 No data recorded  Encounter Date: 05/23/2022   PT End of Session - 05/23/22 1328     Visit Number 11    Number of Visits 17    Date for PT Re-Evaluation 06/03/22    Authorization Type Medicare (traditional)    Authorization Time Period 04/08/22-06/03/22    Authorization - Visit Number 1    Progress Note Due on Visit 10    PT Start Time 1302    PT Stop Time 1340    PT Time Calculation (min) 38 min    Activity Tolerance Patient tolerated treatment well             Past Medical History:  Diagnosis Date   Adolescent scoliosis    Anxiety    Atrial fibrillation ([place])    Dysrhythmia    A fib   History of chicken pox    Hyperlipidemia    Hyperplastic colon polyp 09/05/2016   Tubular adenoma of colon   Hypertension    benign/ controlled   Short-term memory loss    Shoulder tendinitis    LEFT upper biceps tendonitis, rotator cuff tendinitis   Stroke ([place]) 81/4481   embolic CVA/ short term memory loss    Past Surgical History:  Procedure Laterality Date   BACK SURGERY     x2 at age 5 and 29 for scoliosis   COLONOSCOPY N/A 09/05/2016   Procedure: COLONOSCOPY;  Surgeon: Lollie Sails, MD;  Location: [facility] ENDOSCOPY;  Service: Endoscopy;  Laterality: N/A;  Pt on Coumadin (PT/ INR)   SHOULDER ARTHROSCOPY WITH OPEN ROTATOR CUFF REPAIR Left 02/08/2018   Procedure: SHOULDER ARTHROSCOPY WITH OPEN ROTATOR CUFF REPAIR;  Surgeon: Corky Mull, MD;  Location: [facility] ORS;  Service: Orthopedics;  Laterality: Left;   SHOULDER CLOSED REDUCTION Left 05/09/2018   Procedure: CLOSED MANIPULATION SHOULDER  UNDER ANESTHESIA WITH STEROID INJECTION;  Surgeon: Corky Mull, MD;  Location: [facility];  Service:  Orthopedics;  Laterality: Left;   TONSILLECTOMY      There were no vitals filed for this visit.   Subjective Assessment - 05/23/22 1303     Subjective Pt reports doing well overall. Reports she is doing well with her HEP.    Pertinent History Donnabelle Blanchard is a 30yoF who comes to [facility] for recurrent pain at LLE elbow and shoulder. Pt followed by orthopedics for this issue ongoing. Pt has history of Left shoulder RC repair, frozen shoulder, manipulation under anesthesia, and multiple injections.    Limitations Lifting;House hold activities    Patient Stated Goals Improve pain and L shoulder/elbow mobility so she doesnt have to rely on RUE so much    Pain Onset More than a month ago    Pain Onset More than a month ago              Ther-Ex Nustep seat 7 UE 11 67mns no resistance for gentle AAROM of L shoulder elbow; cuing to use RUE to move L Supine AROM elbow flex<> ext x15 with cuing to maintain upper arm contact to mat table for max available motion (60d of abd) Elbow flex <> ext 2# x12with cuing for eccentric control and supination with ext with good carry over AAROM shoulder flex with hands clasped in supine  x12 with good carry over of HEP  Attempted seated supinated forward press- unable Row with RTB 2x 10 with cuing for eccentric control for max available bilat elbow ext with good carry over Attempted shoulder flex aarom on handrail seated, unable Bicep stretch at wall 30sec   Manual         STM with trigger point release to bicep muscle belly, pronator teres, and tricep group alt heat to area not being worked on from bicep and forearm; maintained elbow ext and supination to available range with overpressure into flex and ext throughout  (W/ heat applied to tricep group) Radial + ulnar PA mob G3 3x 30sec bout for with movement increased extensor mobility; humeral PA mob G3 3x 30sec bout for with movement increased flexion mobility; ulnar to radial glide grade 3with supination  movement x55mn  In supination stretch overpressure with heat on tricep group                          PT Education - 05/23/22 1326     Education Details therex form/technique    Person(s) Educated Patient    Methods Explanation;Demonstration;Verbal cues    Comprehension Verbalized understanding;Returned demonstration;Verbal cues required              PT Short Term Goals - 05/20/22 1134       PT SHORT TERM GOAL #1   Title Pt will be independent with HEP to improve pain/mobility of L elbow and shoulder for ADL completion    Baseline 04/08/22: Initiated ; 05/20/22 completing regularly without concern    Time 4    Period Weeks    Status Achieved               PT Long Term Goals - 05/20/22 1134       PT LONG TERM GOAL #1   Title Pt will increase FOTO to target score to demonstrate clinically significant improvement in functional mobility    Baseline 04/08/22: Deferred to next session; collected on 04/29/22: 30 with target of 56; 05/20/22 47    Time 8    Period Weeks    Status On-going    Target Date 06/03/22      PT LONG TERM GOAL #2   Title Pt will improve L shoulder AROM in flexion  > 10 deg to demonstrate clinically significant improvement and ability to perform overhead/ in front ADL's.    Baseline 04/08/22: 47 deg flex, 55 deg abduction; 05/20/22 flex: 52d abd: 45d    Time 8    Period Weeks    Status Not Met    Target Date 06/03/22      PT LONG TERM GOAL #3   Title Pt will improve L elbow AROM in flexion and extension by at least 5 deg/direction to assist in LUE ADL's such as dressing/feeding    Baseline 04/08/22: 25-90 deg; 05/20/22 22-112d    Time 8    Period Weeks    Status Partially Met      PT LONG TERM GOAL #4   Title Pt will report < 6/10 NPS as worst pain in L shoulder and elbow with LUE tasks to demonstrate clinically significant reduction in pain levels with ADL's.    Baseline 04/08/22: Up to 8/10 NPS; 05/20/22 8/10    Time 8    Period  Weeks    Status On-going  Plan - 05/23/22 1340     Clinical Impression Statement PT continued therex progression for increased elbow mobility and strength with success. Patient is able to comply with all cuing for proper technique of therex with excellent motivation and no pain throughout session. PT will continue progression as able.    Personal Factors and Comorbidities Age;Comorbidity 3+;Education;Past/Current Experience;Time since onset of injury/illness/exacerbation    Comorbidities Adolescent scoliosis    Adult idiopathic generalized osteoporosis 05/02/2019   BD 1/20, Fosamax started 1/20    Allergic state   Penicillin, Latex    Anxiety   Not bad    Arthritis Left upper arm   Cortisone shot helps for a short while    Atrial fibrillation (427.31) 03/26/2014    Cerebrovascular accident, embolic (CMS-HCC) 0/05/6807   7/11    Essential hypertension, benign    History of chicken pox    Hyperplastic colon polyp 09/05/2016    OSA on CPAP 06/01/2020    Other and unspecified hyperlipidemia    Rotator cuff tendinitis, left 12/22/2017    Tubular adenoma of colon 09/05/2016    Vascular dementia without behavioral disturbance (CMS-HCC) 05/27/2020    Examination-Activity Limitations Bathing;Hygiene/Grooming;Bed Mobility;Caring for Others;Reach Overhead;Self Feeding;Carry;Dressing    Examination-Participation Restrictions Church;Cleaning;Shop;Community Activity;Meal Prep;Driving    Stability/Clinical Decision Making Evolving/Moderate complexity    Clinical Decision Making Moderate    Rehab Potential Fair    PT Frequency 2x / week    PT Duration 8 weeks    PT Treatment/Interventions ADLs/Self Care Home Management;Electrical Stimulation;Aquatic Therapy;Moist Heat;Cryotherapy;Traction;Functional mobility training;Therapeutic activities;Patient/family education;Therapeutic exercise;Neuromuscular re-education;Manual techniques;Dry needling;Passive range of motion;Spinal Manipulations;Joint  Manipulations    PT Next Visit Plan continue POC    PT Home Exercise Plan Access Code: [email or web address]    Consulted and Agree with Plan of Care Patient;Family member/caregiver    Family Member Consulted Caregiver- Izora Gala             Patient will benefit from skilled therapeutic intervention in order to improve the following deficits and impairments:  Pain, Improper body mechanics, Decreased mobility, Increased muscle spasms, Postural dysfunction, Decreased activity tolerance, Decreased range of motion, Decreased strength, Impaired flexibility  Visit Diagnosis: Abnormal posture  Chronic left shoulder pain  Pain in left elbow     Problem List Patient Active Problem List   Diagnosis Date Noted   Left rotator cuff tear 02/08/2018   Durwin Reges DPT Durwin Reges, PT 05/23/2022, 1:43 PM  [facility] [address] [place], [address] Phone: [phone]   Fax:  [phone]  Name: Tonda Wiederhold MRN: [id number] Date of Birth: November 13, 1951

## 2022-05-25 ENCOUNTER — Ambulatory Visit: Payer: Medicare Other | Admitting: Physical Therapy

## 2022-05-25 ENCOUNTER — Encounter: Payer: Self-pay | Admitting: Physical Therapy

## 2022-05-25 DIAGNOSIS — R293 Abnormal posture: Secondary | ICD-10-CM | POA: Diagnosis not present

## 2022-05-25 NOTE — Therapy (Unsigned)
[facility] [address] [place], [address] Phone: [phone]   Fax:  [phone]  Physical Therapy Treatment  Patient Details  Name: Shannon Donovan MRN: [id number] Date of Birth: [birth year] No data recorded  Encounter Date: 05/25/2022    Past Medical History:  Diagnosis Date   Adolescent scoliosis    Anxiety    Atrial fibrillation ([place])    Dysrhythmia    A fib   History of chicken pox    Hyperlipidemia    Hyperplastic colon polyp 09/05/2016   Tubular adenoma of colon   Hypertension    benign/ controlled   Short-term memory loss    Shoulder tendinitis    LEFT upper biceps tendonitis, rotator cuff tendinitis   Stroke ([place]) 85/6314   embolic CVA/ short term memory loss    Past Surgical History:  Procedure Laterality Date   BACK SURGERY     x2 at age 56 and 41 for scoliosis   COLONOSCOPY N/A 09/05/2016   Procedure: COLONOSCOPY;  Surgeon: Lollie Sails, MD;  Location: [facility] ENDOSCOPY;  Service: Endoscopy;  Laterality: N/A;  Pt on Coumadin (PT/ INR)   SHOULDER ARTHROSCOPY WITH OPEN ROTATOR CUFF REPAIR Left 02/08/2018   Procedure: SHOULDER ARTHROSCOPY WITH OPEN ROTATOR CUFF REPAIR;  Surgeon: Corky Mull, MD;  Location: [facility] ORS;  Service: Orthopedics;  Laterality: Left;   SHOULDER CLOSED REDUCTION Left 05/09/2018   Procedure: CLOSED MANIPULATION SHOULDER  UNDER ANESTHESIA WITH STEROID INJECTION;  Surgeon: Corky Mull, MD;  Location: [facility];  Service: Orthopedics;  Laterality: Left;   TONSILLECTOMY      There were no vitals filed for this visit.    Ther-Ex Nustep seat 7 UE 11 17mns no resistance for gentle AAROM of L shoulder elbow; cuing to use RUE to move L Elbow flex <> ext 2# x12 (in supine with shoulder abd 60d) with cuing for eccentric control and supination with ext with good carry over Forearm supination <> 2# x8 (in supine with shoulder abd 60d) with difficulty with  supination Supine supination + ext stretch 30sec   Manual         STM with trigger point release to bicep muscle belly, pronator teres, and tricep group alt heat to area not being worked on from bicep and forearm; maintained elbow ext and supination to available range with overpressure into flex and ext throughout  (W/ heat applied to tricep group) Radial + ulnar PA mob G3 3x 30sec bout for with movement increased extensor mobility; humeral PA mob G3 3x 30sec bout for with movement increased flexion mobility; ulnar to radial glide grade 3with supination movement x139m                                   PT Short Term Goals - 05/20/22 1134       PT SHORT TERM GOAL #1   Title Pt will be independent with HEP to improve pain/mobility of L elbow and shoulder for ADL completion    Baseline 04/08/22: Initiated ; 05/20/22 completing regularly without concern    Time 4    Period Weeks    Status Achieved               PT Long Term Goals - 05/20/22 1134       PT LONG TERM GOAL #1   Title Pt will increase FOTO to target  score to demonstrate clinically significant improvement in functional mobility    Baseline 04/08/22: Deferred to next session; collected on 04/29/22: 30 with target of 56; 05/20/22 47    Time 8    Period Weeks    Status On-going    Target Date 06/03/22      PT LONG TERM GOAL #2   Title Pt will improve L shoulder AROM in flexion  > 10 deg to demonstrate clinically significant improvement and ability to perform overhead/ in front ADL's.    Baseline 04/08/22: 47 deg flex, 55 deg abduction; 05/20/22 flex: 52d abd: 45d    Time 8    Period Weeks    Status Not Met    Target Date 06/03/22      PT LONG TERM GOAL #3   Title Pt will improve L elbow AROM in flexion and extension by at least 5 deg/direction to assist in LUE ADL's such as dressing/feeding    Baseline 04/08/22: 25-90 deg; 05/20/22 22-112d    Time 8    Period Weeks    Status Partially Met      PT  LONG TERM GOAL #4   Title Pt will report < 6/10 NPS as worst pain in L shoulder and elbow with LUE tasks to demonstrate clinically significant reduction in pain levels with ADL's.    Baseline 04/08/22: Up to 8/10 NPS; 05/20/22 8/10    Time 8    Period Weeks    Status On-going                    Patient will benefit from skilled therapeutic intervention in order to improve the following deficits and impairments:     Visit Diagnosis: No diagnosis found.     Problem List Patient Active Problem List   Diagnosis Date Noted   Left rotator cuff tear 02/08/2018    Durwin Reges, PT 05/25/2022, 10:17 AM  [facility] [address] [place], [address] Phone: [phone]   Fax:  [phone]  Name: Shannon Donovan MRN: [id number] Date of Birth: 18-Jun-1951

## 2022-05-30 ENCOUNTER — Encounter: Payer: Self-pay | Admitting: Physical Therapy

## 2022-05-30 ENCOUNTER — Ambulatory Visit: Payer: Medicare Other | Admitting: Physical Therapy

## 2022-05-30 DIAGNOSIS — G8929 Other chronic pain: Secondary | ICD-10-CM

## 2022-05-30 DIAGNOSIS — M25522 Pain in left elbow: Secondary | ICD-10-CM

## 2022-05-30 DIAGNOSIS — R293 Abnormal posture: Secondary | ICD-10-CM | POA: Diagnosis not present

## 2022-05-30 NOTE — Therapy (Unsigned)
[facility] [address], [place], [address] Phone: [phone]   Fax:  [phone]  Physical Therapy Treatment  Patient Details  Name: Shannon Donovan MRN: [id number] Date of Birth: September 17, 1951 No data recorded  Encounter Date: 05/30/2022    Past Medical History:  Diagnosis Date   Adolescent scoliosis    Anxiety    Atrial fibrillation ([place])    Dysrhythmia    A fib   History of chicken pox    Hyperlipidemia    Hyperplastic colon polyp 09/05/2016   Tubular adenoma of colon   Hypertension    benign/ controlled   Short-term memory loss    Shoulder tendinitis    LEFT upper biceps tendonitis, rotator cuff tendinitis   Stroke ([place]) 79/1505   embolic CVA/ short term memory loss    Past Surgical History:  Procedure Laterality Date   BACK SURGERY     x2 at age 1 and 89 for scoliosis   COLONOSCOPY N/A 09/05/2016   Procedure: COLONOSCOPY;  Surgeon: Lollie Sails, MD;  Location: [facility] ENDOSCOPY;  Service: Endoscopy;  Laterality: N/A;  Pt on Coumadin (PT/ INR)   SHOULDER ARTHROSCOPY WITH OPEN ROTATOR CUFF REPAIR Left 02/08/2018   Procedure: SHOULDER ARTHROSCOPY WITH OPEN ROTATOR CUFF REPAIR;  Surgeon: Corky Mull, MD;  Location: [facility] ORS;  Service: Orthopedics;  Laterality: Left;   SHOULDER CLOSED REDUCTION Left 05/09/2018   Procedure: CLOSED MANIPULATION SHOULDER  UNDER ANESTHESIA WITH STEROID INJECTION;  Surgeon: Corky Mull, MD;  Location: [facility];  Service: Orthopedics;  Laterality: Left;   TONSILLECTOMY      There were no vitals filed for this visit.      Ther-Ex Nustep seat 7 UE 11 66mns 3 mins L2; 377ms no resistance for gentle AAROM of L shoulder elbow; cuing to use RUE to move L Forearm supination <> pronation1# x12 holding end of the weight lever (in supine with shoulder abd 60d) with difficulty with supination Standing eccentric bicep curl2# x12 with cuing for eccentric  control and supination with ext with good carry over [place] AAROM chest press in sitting x12 with TC for technique initially with good carry over Supine supination + ext stretch 30sec   Manual         STM with trigger point release to bicep muscle belly, pronator teres, and tricep group alt heat to area not being worked on from bicep and forearm; maintained elbow ext and supination to available range with overpressure into flex and ext throughout  (W/ heat applied to tricep group) Radial + ulnar PA mob G3 3x 30sec bout for with movement increased extensor mobility; humeral PA mob G3 3x 30sec bout for with movement increased flexion mobility; ulnar to radial glide grade 3with supination movement x1m76m                             PT Short Term Goals - 05/20/22 1134       PT SHORT TERM GOAL #1   Title Pt will be independent with HEP to improve pain/mobility of L elbow and shoulder for ADL completion    Baseline 04/08/22: Initiated ; 05/20/22 completing regularly without concern    Time 4    Period Weeks    Status Achieved               PT Long Term Goals - 05/20/22 1134  PT LONG TERM GOAL #1   Title Pt will increase FOTO to target score to demonstrate clinically significant improvement in functional mobility    Baseline 04/08/22: Deferred to next session; collected on 04/29/22: 30 with target of 56; 05/20/22 47    Time 8    Period Weeks    Status On-going    Target Date 06/03/22      PT LONG TERM GOAL #2   Title Pt will improve L shoulder AROM in flexion  > 10 deg to demonstrate clinically significant improvement and ability to perform overhead/ in front ADL's.    Baseline 04/08/22: 47 deg flex, 55 deg abduction; 05/20/22 flex: 52d abd: 45d    Time 8    Period Weeks    Status Not Met    Target Date 06/03/22      PT LONG TERM GOAL #3   Title Pt will improve L elbow AROM in flexion and extension by at least 5 deg/direction to assist in LUE ADL's such as  dressing/feeding    Baseline 04/08/22: 25-90 deg; 05/20/22 22-112d    Time 8    Period Weeks    Status Partially Met      PT LONG TERM GOAL #4   Title Pt will report < 6/10 NPS as worst pain in L shoulder and elbow with LUE tasks to demonstrate clinically significant reduction in pain levels with ADL's.    Baseline 04/08/22: Up to 8/10 NPS; 05/20/22 8/10    Time 8    Period Weeks    Status On-going                    Patient will benefit from skilled therapeutic intervention in order to improve the following deficits and impairments:     Visit Diagnosis: No diagnosis found.     Problem List Patient Active Problem List   Diagnosis Date Noted   Left rotator cuff tear 02/08/2018    Durwin Reges, PT 05/30/2022, 1:07 PM  [facility] [address], [place], [address] Phone: [phone]   Fax:  [phone]  Name: Shannon Donovan MRN: [id number] Date of Birth: Nov 08, 1951

## 2022-06-01 ENCOUNTER — Encounter: Payer: Self-pay | Admitting: Physical Therapy

## 2022-06-01 ENCOUNTER — Ambulatory Visit: Payer: Medicare Other | Admitting: Physical Therapy

## 2022-06-01 DIAGNOSIS — M25522 Pain in left elbow: Secondary | ICD-10-CM

## 2022-06-01 DIAGNOSIS — R293 Abnormal posture: Secondary | ICD-10-CM | POA: Diagnosis not present

## 2022-06-01 DIAGNOSIS — G8929 Other chronic pain: Secondary | ICD-10-CM

## 2022-06-01 NOTE — Therapy (Addendum)
OUTPATIENT PHYSICAL THERAPY TREATMENT NOTE   Patient Name: Shannon Donovan MRN: [id number] DOB:04/01/1951, 71 y.o., female Today's Date: 06/01/2022  PCP: Damaris Hippo PA REFERRING PROVIDER: Damaris Hippo PA   PT End of Session - 06/01/22 0839     Visit Number 14    Number of Visits 17    Date for PT Re-Evaluation 06/03/22    Authorization Type Medicare (traditional)    Authorization Time Period 04/08/22-06/03/22    Authorization - Visit Number 4    Progress Note Due on Visit 10    PT Start Time 0835    PT Stop Time 0913    PT Time Calculation (min) 38 min    Activity Tolerance Patient tolerated treatment well    Behavior During Therapy [facility] for tasks assessed/performed             Past Medical History:  Diagnosis Date   Adolescent scoliosis    Anxiety    Atrial fibrillation ([place])    Dysrhythmia    A fib   History of chicken pox    Hyperlipidemia    Hyperplastic colon polyp 09/05/2016   Tubular adenoma of colon   Hypertension    benign/ controlled   Short-term memory loss    Shoulder tendinitis    LEFT upper biceps tendonitis, rotator cuff tendinitis   Stroke ([place]) 52/7782   embolic CVA/ short term memory loss   Past Surgical History:  Procedure Laterality Date   BACK SURGERY     x2 at age 68 and 42 for scoliosis   COLONOSCOPY N/A 09/05/2016   Procedure: COLONOSCOPY;  Surgeon: Lollie Sails, MD;  Location: [facility] ENDOSCOPY;  Service: Endoscopy;  Laterality: N/A;  Pt on Coumadin (PT/ INR)   SHOULDER ARTHROSCOPY WITH OPEN ROTATOR CUFF REPAIR Left 02/08/2018   Procedure: SHOULDER ARTHROSCOPY WITH OPEN ROTATOR CUFF REPAIR;  Surgeon: Corky Mull, MD;  Location: [facility] ORS;  Service: Orthopedics;  Laterality: Left;   SHOULDER CLOSED REDUCTION Left 05/09/2018   Procedure: CLOSED MANIPULATION SHOULDER  UNDER ANESTHESIA WITH STEROID INJECTION;  Surgeon: Corky Mull, MD;  Location: [place];  Service: Orthopedics;  Laterality: Left;   TONSILLECTOMY      Patient Active Problem List   Diagnosis Date Noted   Left rotator cuff tear 02/08/2018    REFERRING DIAG: chronic left shoulder pain; pain in the elbow  THERAPY DIAG:  Abnormal posture  Chronic left shoulder pain  Pain in left elbow  Rationale for Evaluation and Treatment Rehabilitation  PERTINENT HISTORY: Shannon Donovan is a 67yoF who comes [facility] for recurrent pain at LLE elbow and shoulder. Pt followed by orthopedics for this issue ongoing. Pt has history of Left shoulder RC repair, frozen shoulder, manipulation under anesthesia, and multiple injections.  PRECAUTIONS: fall  SUBJECTIVE: Pt reports she is doing well this am. Is completing HEP. Has some increased pain this am d/t not taking her pain medication yet.   PAIN:  Are you having pain? Yes 6/10 in L elbow    TODAY'S TREATMENT:   Ther-Ex Nustep seat 7 UE 11 53mns L2 for gentle AAROM of L shoulder elbow; cuing to use RUE to move L Forearm supination <> pronation1# x12 holding end of the weight lever (in supine with shoulder abd 60d) with PT stabilizing elbow Standing eccentric bicep curl2# x12 with cuing for eccentric control and supination with ext with good carry over Mini wall push up x12 with good carry over of max demo/cuing Attempt chair push difficulty sequencing -  able to complete a couple reps Bicep/pronator stretch on wall 59mn hold   Manual         STM with trigger point release to bicep muscle belly, pronator teres, and tricep group alt heat to area not being worked on from bicep and forearm; maintained elbow ext and supination to available range with overpressure into flex and ext throughout  (W/ heat applied to tricep group) Radial + ulnar PA mob G3 3x 30sec bout for with movement increased extensor mobility; humeral PA mob G3 3x 30sec bout for with movement increased flexion mobility; ulnar to radial glide grade 3with supination movement x137m  PROM L shoulder all directions 44m65m     Clinical  Impression: PT continued manual techniques and therex progression for increased mobility and strength of LUE with success. Patient continues to tolerate increased manual techniques (pressure). PT attempted shoulder PROM with continued limited range in adhesive capsulitis pattern ER>abd>flex. Pt is able to comply with all cuing for proper technique of therex with excellent motivation throughout session. PT will continue progression as able.    PATIENT EDUCATION: Education details: therex form/technique Person educated: Patient and CarAmbulance personxpCustomer service managerucation comprehension: verbalized understanding and returned demonstration   HOME EXERCISE PROGRAM: [email or web address]      PT Short Term Goals - 05/20/22 1134       PT SHORT TERM GOAL #1   Title Pt will be independent with HEP to improve pain/mobility of L elbow and shoulder for ADL completion    Baseline 04/08/22: Initiated ; 05/20/22 completing regularly without concern    Time 4    Period Weeks    Status Achieved              PT Long Term Goals - 05/20/22 1134       PT LONG TERM GOAL #1   Title Pt will increase FOTO to target score to demonstrate clinically significant improvement in functional mobility    Baseline 04/08/22: Deferred to next session; collected on 04/29/22: 30 with target of 56; 05/20/22 47    Time 8    Period Weeks    Status On-going    Target Date 06/03/22      PT LONG TERM GOAL #2   Title Pt will improve L shoulder AROM in flexion  > 10 deg to demonstrate clinically significant improvement and ability to perform overhead/ in front ADL's.    Baseline 04/08/22: 47 deg flex, 55 deg abduction; 05/20/22 flex: 52d abd: 45d    Time 8    Period Weeks    Status Not Met    Target Date 06/03/22      PT LONG TERM GOAL #3   Title Pt will improve L elbow AROM in flexion and extension by at least 5 deg/direction to assist in LUE ADL's such as dressing/feeding    Baseline 04/08/22: 25-90  deg; 05/20/22 22-112d    Time 8    Period Weeks    Status Partially Met      PT LONG TERM GOAL #4   Title Pt will report < 6/10 NPS as worst pain in L shoulder and elbow with LUE tasks to demonstrate clinically significant reduction in pain levels with ADL's.    Baseline 04/08/22: Up to 8/10 NPS; 05/20/22 8/10    Time 8    Period Weeks    Status On-going              Plan - 06/01/22 [id number]  Clinical Impression Statement PT continued manual techniques and therex progression ?for increased mobility and strength of LUE with success. Patient continues to tolerate increased manual techniques (pressure). PT attempted shoulder PROM with continued limited range in adhesive capsulitis pattern ER>abd>flex. Pt is able to comply with all cuing for proper technique of therex with excellent motivation throughout session. PT will continue progression as able.    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/16/3824   7/11    Essential hypertension, benign    History of chicken pox    Hyperplastic colon polyp 09/05/2016    OSA on CPAP 06/01/2020    Other and unspecified hyperlipidemia    Rotator cuff tendinitis, left 12/22/2017    Tubular adenoma of colon 09/05/2016    Vascular dementia without behavioral disturbance (CMS-HCC) 05/27/2020    Examination-Activity Limitations Bathing;Hygiene/Grooming;Bed Mobility;Caring for Others;Reach Overhead;Self Feeding;Carry;Dressing    Examination-Participation Restrictions Church;Cleaning;Shop;Community Activity;Meal Prep;Driving    Stability/Clinical  Decision Making Evolving/Moderate complexity    Clinical Decision Making Moderate    Rehab Potential Fair    PT Frequency 2x / week    PT Duration 8 weeks    PT Treatment/Interventions ADLs/Self Care Home Management;Electrical Stimulation;Aquatic Therapy;Moist Heat;Cryotherapy;Traction;Functional mobility training;Therapeutic activities;Patient/family education;Therapeutic exercise;Neuromuscular re-education;Manual techniques;Dry needling;Passive range of motion;Spinal Manipulations;Joint Manipulations    PT Next Visit Plan continue POC    PT Home Exercise Plan Access Code: [email or web address]    Consulted and Agree with Plan of Care Patient;Family member/caregiver              Durwin Reges DPT Durwin Reges, PT 06/01/2022, 9:21 AM

## 2022-06-06 ENCOUNTER — Ambulatory Visit: Payer: Medicare Other | Admitting: Physical Therapy

## 2022-06-06 ENCOUNTER — Encounter: Payer: Self-pay | Admitting: Physical Therapy

## 2022-06-06 DIAGNOSIS — R293 Abnormal posture: Secondary | ICD-10-CM | POA: Diagnosis not present

## 2022-06-06 DIAGNOSIS — G8929 Other chronic pain: Secondary | ICD-10-CM

## 2022-06-06 DIAGNOSIS — M25522 Pain in left elbow: Secondary | ICD-10-CM

## 2022-06-06 NOTE — Therapy (Signed)
OUTPATIENT PHYSICAL THERAPY TREATMENT NOTE/PROGRESS  NOTE REPORTING PERIOD 04/08/22 - 06/07/22    Patient Name: Shannon Donovan MRN: [id number] DOB:09/13/1951, 71 y.o., female Today's Date: 06/07/2022  PCP: Damaris Hippo PA REFERRING PROVIDER: Damaris Hippo PA   PT End of Session - 06/06/22 0843     Visit Number 15    Number of Visits 17    Date for PT Re-Evaluation 08/12/22    Authorization Type Medicare (traditional)    Authorization Time Period 04/08/22-06/03/22    Authorization - Visit Number 5    Progress Note Due on Visit 10    PT Start Time 0835    PT Stop Time 0913    PT Time Calculation (min) 38 min    Activity Tolerance Patient tolerated treatment well    Behavior During Therapy [facility] for tasks assessed/performed              Past Medical History:  Diagnosis Date   Adolescent scoliosis    Anxiety    Atrial fibrillation ([place])    Dysrhythmia    A fib   History of chicken pox    Hyperlipidemia    Hyperplastic colon polyp 09/05/2016   Tubular adenoma of colon   Hypertension    benign/ controlled   Short-term memory loss    Shoulder tendinitis    LEFT upper biceps tendonitis, rotator cuff tendinitis   Stroke ([place]) 74/2595   embolic CVA/ short term memory loss   Past Surgical History:  Procedure Laterality Date   BACK SURGERY     x2 at age 28 and 25 for scoliosis   COLONOSCOPY N/A 09/05/2016   Procedure: COLONOSCOPY;  Surgeon: Lollie Sails, MD;  Location: [facility] ENDOSCOPY;  Service: Endoscopy;  Laterality: N/A;  Pt on Coumadin (PT/ INR)   SHOULDER ARTHROSCOPY WITH OPEN ROTATOR CUFF REPAIR Left 02/08/2018   Procedure: SHOULDER ARTHROSCOPY WITH OPEN ROTATOR CUFF REPAIR;  Surgeon: Corky Mull, MD;  Location: [facility] ORS;  Service: Orthopedics;  Laterality: Left;   SHOULDER CLOSED REDUCTION Left 05/09/2018   Procedure: CLOSED MANIPULATION SHOULDER  UNDER ANESTHESIA WITH STEROID INJECTION;  Surgeon: Corky Mull, MD;  Location: [place];  Service:  Orthopedics;  Laterality: Left;   TONSILLECTOMY     Patient Active Problem List   Diagnosis Date Noted   Left rotator cuff tear 02/08/2018    REFERRING DIAG: chronic left shoulder pain; pain in the elbow  THERAPY DIAG:  Abnormal posture  Chronic left shoulder pain  Pain in left elbow  Rationale for Evaluation and Treatment Rehabilitation  PERTINENT HISTORY: Shannon Donovan is a 32yoF who comes to [facility] for recurrent pain at LLE elbow and shoulder. Pt followed by orthopedics for this issue ongoing. Pt has history of Left shoulder RC repair, frozen shoulder, manipulation under anesthesia, and multiple injections.  PRECAUTIONS: fall  SUBJECTIVE: Pt reports some increased L elbow pain this weekend.   PAIN:  Are you having pain? Yes 9/10 in L shoulder/elbow    TODAY'S TREATMENT:   Ther-Ex Nustep seat 7 UE 11 24mns L3 for gentle AAROM of L shoulder elbow; cuing to use RUE to move L Seated AAROM flexion table slide x15 2sec hold in max flex Flexion/lat stretch in sitting with elbows on table x30sec (added to HEP) Standing eccentric bicep curl2# x12 with cuing for eccentric control and supination with ext with good carry over    Manual         STM with trigger point release to bicep muscle belly,  pronator teres, and tricep group alt heat to area not being worked on from bicep and forearm; maintained elbow ext and supination to available range with overpressure into flex and ext throughout  (W/ heat applied to tricep group) Radial + ulnar PA mob G3 3x 30sec bout for with movement increased extensor mobility; humeral PA mob G3 3x 30sec bout for with movement increased flexion mobility; ulnar to radial glide grade 3with supination movement x72mn  PROM L shoulder all directions 287ms     Clinical Impression: PT continued manual techniques and therex progression for increased mobility and strength of LUE with success. Patient continues to tolerate increased manual techniques (pressure).  PT attempted shoulder PROM with continued limited range in adhesive capsulitis pattern ER>abd>flex. Pt is able to comply with all cuing for proper technique of therex with excellent motivation throughout session. PT will continue progression as able.    PATIENT EDUCATION: Education details: therex form/technique Person educated: Patient and CaAmbulance personExCustomer service managerducation comprehension: verbalized understanding and returned demonstration   HOME EXERCISE PROGRAM: ACVANKGR      PT Short Term Goals - 05/20/22 1134       PT SHORT TERM GOAL #1   Title Pt will be independent with HEP to improve pain/mobility of L elbow and shoulder for ADL completion    Baseline 04/08/22: Initiated ; 05/20/22 completing regularly without concern    Time 4    Period Weeks    Status Achieved              PT Long Term Goals - 05/20/22 1134       PT LONG TERM GOAL #1   Title Pt will increase FOTO to target score to demonstrate clinically significant improvement in functional mobility    Baseline 04/08/22: Deferred to next session; collected on 04/29/22: 30 with target of 56; 05/20/22 47 ; 06/06/22 36   Time 8    Period Weeks    Status On-going    Target Date 06/03/22      PT LONG TERM GOAL #2   Title Pt will improve L shoulder AROM in flexion  > 10 deg to demonstrate clinically significant improvement and ability to perform overhead/ in front ADL's.    Baseline 04/08/22: 47 deg flex, 55 deg abduction; 05/20/22 flex: 52d abd: 45d  06/06/22 flex: 52d abd: 45d    Time 8    Period Weeks    Status Not Met    Target Date 06/03/22      PT LONG TERM GOAL #3   Title Pt will improve L elbow AROM in flexion and extension by at least 5 deg/direction to assist in LUE ADL's such as dressing/feeding    Baseline 04/08/22: 25-90 deg; 05/20/22 22-112d 06/06/22 16-113d   Time 8    Period Weeks    Status Partially Met      PT LONG TERM GOAL #4   Title Pt will report < 6/10 NPS  as worst pain in L shoulder and elbow with LUE tasks to demonstrate clinically significant reduction in pain levels with ADL's.    Baseline 04/08/22: Up to 8/10 NPS; 05/20/22 8/10 ; 06/06/22 7/10   Time 8    Period Weeks    Status On-going                 ChDurwin RegesPT ChDurwin RegesPT 06/07/2022, 2:32 PM

## 2022-06-08 ENCOUNTER — Ambulatory Visit: Payer: Medicare Other | Admitting: Physical Therapy

## 2022-06-08 ENCOUNTER — Encounter: Payer: Self-pay | Admitting: Physical Therapy

## 2022-06-08 DIAGNOSIS — M25522 Pain in left elbow: Secondary | ICD-10-CM

## 2022-06-08 DIAGNOSIS — R293 Abnormal posture: Secondary | ICD-10-CM | POA: Diagnosis not present

## 2022-06-08 DIAGNOSIS — G8929 Other chronic pain: Secondary | ICD-10-CM

## 2022-06-08 NOTE — Therapy (Signed)
OUTPATIENT PHYSICAL THERAPY TREATMENT NOTE/PROGRESS  NOTE REPORTING PERIOD 04/08/22 - 06/07/22    Patient Name: Shannon Donovan MRN: [id number] DOB:09-22-1951, 71 y.o., female Today's Date: 06/08/2022  PCP: Damaris Hippo PA REFERRING PROVIDER: Damaris Hippo PA   PT End of Session - 06/08/22 0834     Visit Number 16    Number of Visits 17    Date for PT Re-Evaluation 08/12/22    Authorization Type Medicare (traditional)    Authorization Time Period 04/08/22-06/03/22    Authorization - Visit Number 6    Progress Note Due on Visit 10    PT Start Time 0830    PT Stop Time 0910    PT Time Calculation (min) 40 min    Activity Tolerance Patient tolerated treatment well    Behavior During Therapy [facility] for tasks assessed/performed               Past Medical History:  Diagnosis Date   Adolescent scoliosis    Anxiety    Atrial fibrillation ([place])    Dysrhythmia    A fib   History of chicken pox    Hyperlipidemia    Hyperplastic colon polyp 09/05/2016   Tubular adenoma of colon   Hypertension    benign/ controlled   Short-term memory loss    Shoulder tendinitis    LEFT upper biceps tendonitis, rotator cuff tendinitis   Stroke ([place]) 87/8676   embolic CVA/ short term memory loss   Past Surgical History:  Procedure Laterality Date   BACK SURGERY     x2 at age 76 and 24 for scoliosis   COLONOSCOPY N/A 09/05/2016   Procedure: COLONOSCOPY;  Surgeon: Lollie Sails, MD;  Location: [facility] ENDOSCOPY;  Service: Endoscopy;  Laterality: N/A;  Pt on Coumadin (PT/ INR)   SHOULDER ARTHROSCOPY WITH OPEN ROTATOR CUFF REPAIR Left 02/08/2018   Procedure: SHOULDER ARTHROSCOPY WITH OPEN ROTATOR CUFF REPAIR;  Surgeon: Corky Mull, MD;  Location: [facility] ORS;  Service: Orthopedics;  Laterality: Left;   SHOULDER CLOSED REDUCTION Left 05/09/2018   Procedure: CLOSED MANIPULATION SHOULDER  UNDER ANESTHESIA WITH STEROID INJECTION;  Surgeon: Corky Mull, MD;  Location: [place];   Service: Orthopedics;  Laterality: Left;   TONSILLECTOMY     Patient Active Problem List   Diagnosis Date Noted   Left rotator cuff tear 02/08/2018    REFERRING DIAG: chronic left shoulder pain; pain in the elbow  THERAPY DIAG:  Abnormal posture  Chronic left shoulder pain  Pain in left elbow  Rationale for Evaluation and Treatment Rehabilitation  PERTINENT HISTORY: Shannon Donovan is a 34yoF who comes [facility] for recurrent pain at LLE elbow and shoulder. Pt followed by orthopedics for this issue ongoing. Pt has history of Left shoulder RC repair, frozen shoulder, manipulation under anesthesia, and multiple injections.  PRECAUTIONS: fall  SUBJECTIVE: Pt reports elbow and shoulder pain with nustep exercise, and any use of LUE. Reports 8/10 elbow pain currently, shoulder feels okay. Compliance with HEP  PAIN:  Are you having pain? Yes 8/10 in L elbow    TODAY'S TREATMENT:   Ther-Ex Nustep seat 7 UE 11 62mns L3 for gentle AAROM of L shoulder elbow; cuing to use RUE to move L Dowel chest press 5# AW x12 Bicep curl 2# DB 2x 12 with cuing for maintained supination; eccentric focus Seated AAROM flexion table slide x15 2sec hold in max flex - same abd x12 with extreme difficulty  Flexion/lat stretch in sitting with elbows on table  x30sec (added to HEP) Bicep stretch at wall x20sec   Manual         STM with trigger point release to bicep muscle belly, pronator teres, and tricep group alt heat to area not being worked on from bicep and forearm; maintained elbow ext and supination to available range with overpressure into flex and ext throughout  (W/ heat applied to tricep group) Radial + ulnar PA mob G3 3x 30sec bout for with movement increased extensor mobility; humeral PA mob G3 3x 30sec bout for with movement increased flexion mobility; ulnar to radial glide grade 3with supination movement x72mn  PROM L shoulder all directions 268ms     Clinical Impression: PT continued manual  techniques and therex progression for increased mobility and strength of LUE with success. Patient continues to tolerate increased manual techniques (pressure). 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  Pt is able to comply with cuing for proper technique of therex with good motivation and no increased pain throughout session. PT will continue progression as able.    PATIENT EDUCATION: Education details: therex form/technique Person educated: Patient and CaAmbulance personExCustomer service managerducation comprehension: verbalized understanding and returned demonstration   HOME EXERCISE PROGRAM: ACVANKGR      PT Short Term Goals - 05/20/22 1134       PT SHORT TERM GOAL #1   Title Pt will be independent with HEP to improve pain/mobility of L elbow and shoulder for ADL completion    Baseline 04/08/22: Initiated ; 05/20/22 completing regularly without concern    Time 4    Period Weeks    Status Achieved              PT Long Term Goals - 05/20/22 1134       PT LONG TERM GOAL #1   Title Pt will increase FOTO to target score to demonstrate clinically significant improvement in functional mobility    Baseline 04/08/22: Deferred to next session; collected on 04/29/22: 30 with target of 56; 05/20/22 47 ; 06/06/22 36   Time 8    Period Weeks    Status On-going    Target Date 06/03/22      PT LONG TERM GOAL #2   Title Pt will improve L shoulder AROM in flexion  > 10 deg to demonstrate clinically significant improvement and ability to perform overhead/ in front ADL's.    Baseline 04/08/22: 47 deg flex, 55 deg abduction; 05/20/22 flex: 52d abd: 45d  06/06/22 flex: 52d abd: 45d    Time 8    Period Weeks    Status Not Met    Target Date 06/03/22      PT LONG TERM GOAL #3   Title Pt will improve L elbow AROM in flexion and extension by at least 5 deg/direction to assist in LUE ADL's such as  dressing/feeding    Baseline 04/08/22: 25-90 deg; 05/20/22 22-112d 06/06/22 16-113d   Time 8    Period Weeks    Status Partially Met      PT LONG TERM GOAL #4   Title Pt will report < 6/10 NPS as worst pain in L shoulder and elbow with LUE tasks to demonstrate clinically significant reduction in pain levels with ADL's.    Baseline 04/08/22: Up to 8/10 NPS; 05/20/22 8/10 ; 06/06/22 7/10   Time 8    Period Weeks    Status On-going  Durwin Reges DPT Durwin Reges, PT 06/08/2022, 9:10 AM

## 2022-06-13 ENCOUNTER — Ambulatory Visit: Payer: Medicare Other | Admitting: Physical Therapy

## 2022-06-13 ENCOUNTER — Encounter: Payer: Self-pay | Admitting: Physical Therapy

## 2022-06-13 DIAGNOSIS — R293 Abnormal posture: Secondary | ICD-10-CM

## 2022-06-13 DIAGNOSIS — G8929 Other chronic pain: Secondary | ICD-10-CM

## 2022-06-13 DIAGNOSIS — M25522 Pain in left elbow: Secondary | ICD-10-CM

## 2022-06-13 NOTE — Therapy (Signed)
OUTPATIENT PHYSICAL THERAPY TREATMENT NOTE/PROGRESS  NOTE REPORTING PERIOD 04/08/22 - 06/07/22    Patient Name: Shannon Donovan MRN: [id number] DOB:1951/10/31, 71 y.o., female Today's Date: 06/13/2022  PCP: Damaris Hippo PA REFERRING PROVIDER: Damaris Hippo PA   PT End of Session - 06/13/22 0925     Visit Number 17    Number of Visits 26    Date for PT Re-Evaluation 08/12/22    Authorization Type Medicare (traditional)    Authorization Time Period 04/08/22-06/03/22    Authorization - Visit Number 7    Progress Note Due on Visit 10    PT Start Time 0922    PT Stop Time 1000    PT Time Calculation (min) 38 min    Activity Tolerance Patient tolerated treatment well    Behavior During Therapy [facility] for tasks assessed/performed                Past Medical History:  Diagnosis Date   Adolescent scoliosis    Anxiety    Atrial fibrillation ([place])    Dysrhythmia    A fib   History of chicken pox    Hyperlipidemia    Hyperplastic colon polyp 09/05/2016   Tubular adenoma of colon   Hypertension    benign/ controlled   Short-term memory loss    Shoulder tendinitis    LEFT upper biceps tendonitis, rotator cuff tendinitis   Stroke ([place]) 40/9811   embolic CVA/ short term memory loss   Past Surgical History:  Procedure Laterality Date   BACK SURGERY     x2 at age 52 and 73 for scoliosis   COLONOSCOPY N/A 09/05/2016   Procedure: COLONOSCOPY;  Surgeon: Lollie Sails, MD;  Location: [facility] ENDOSCOPY;  Service: Endoscopy;  Laterality: N/A;  Pt on Coumadin (PT/ INR)   SHOULDER ARTHROSCOPY WITH OPEN ROTATOR CUFF REPAIR Left 02/08/2018   Procedure: SHOULDER ARTHROSCOPY WITH OPEN ROTATOR CUFF REPAIR;  Surgeon: Corky Mull, MD;  Location: [facility] ORS;  Service: Orthopedics;  Laterality: Left;   SHOULDER CLOSED REDUCTION Left 05/09/2018   Procedure: CLOSED MANIPULATION SHOULDER  UNDER ANESTHESIA WITH STEROID INJECTION;  Surgeon: Corky Mull, MD;  Location: [place];   Service: Orthopedics;  Laterality: Left;   TONSILLECTOMY     Patient Active Problem List   Diagnosis Date Noted   Left rotator cuff tear 02/08/2018    REFERRING DIAG: chronic left shoulder pain; pain in the elbow  THERAPY DIAG:  Abnormal posture  Chronic left shoulder pain  Pain in left elbow  Rationale for Evaluation and Treatment Rehabilitation  PERTINENT HISTORY: Shannon Donovan is a 69yoF who comes to [facility] for recurrent pain at LLE elbow and shoulder. Pt followed by orthopedics for this issue ongoing. Pt has history of Left shoulder RC repair, frozen shoulder, manipulation under anesthesia, and multiple injections.  PRECAUTIONS: fall  SUBJECTIVE: Pt reports she is trying to use her LUE at home more, "usually needs to help with RUE". Shoulder still very stiff. 8/10 pain this am  PAIN:  Are you having pain? Yes 8/10 in L elbow    TODAY'S TREATMENT:   Ther-Ex Nustep seat 7 UE 11 69mns L3 for gentle AAROM of L shoulder elbow; cuing to use RUE to move L OMEGA chest press 5# 2x 6 with therapist assist for initiation  Preacher curl 3# x6; 2# x8 with cuing needed for ext with supination with good carry over Seated AAROM flexion table slide x15 2sec hold in max flex Flexion/lat stretch in sitting  with elbows on table x30sec (added to HEP) Bicep stretch at wall x20sec   Manual         STM with trigger point release to bicep muscle belly, pronator teres, and tricep group alt heat to area not being worked on from bicep and forearm; maintained elbow ext and supination to available range with overpressure into flex and ext throughout  (W/ heat applied to tricep group) Radial + ulnar PA mob G3 3x 30sec bout for with movement increased extensor mobility; humeral PA mob G3 3x 30sec bout for with movement increased flexion mobility; ulnar to radial glide grade 3with supination movement x102mn  PROM L shoulder all directions 242ms     Clinical Impression: PT continued manual techniques  and therex progression for increased mobility and strength of LUE with success. Pt continues to be able to tolerate increased pressure of manual techniques. Patient is able to demonstrate ]minimal motion with shoulder PROM which is a slight improvement. PT will continue progression as able.    PATIENT EDUCATION: Education details: therex form/technique Person educated: Patient and CaAmbulance personExCustomer service managerducation comprehension: verbalized understanding and returned demonstration   HOME EXERCISE PROGRAM: ACVANKGR      PT Short Term Goals - 05/20/22 1134       PT SHORT TERM GOAL #1   Title Pt will be independent with HEP to improve pain/mobility of L elbow and shoulder for ADL completion    Baseline 04/08/22: Initiated ; 05/20/22 completing regularly without concern    Time 4    Period Weeks    Status Achieved              PT Long Term Goals - 05/20/22 1134       PT LONG TERM GOAL #1   Title Pt will increase FOTO to target score to demonstrate clinically significant improvement in functional mobility    Baseline 04/08/22: Deferred to next session; collected on 04/29/22: 30 with target of 56; 05/20/22 47 ; 06/06/22 36   Time 8    Period Weeks    Status On-going    Target Date 06/03/22      PT LONG TERM GOAL #2   Title Pt will improve L shoulder AROM in flexion  > 10 deg to demonstrate clinically significant improvement and ability to perform overhead/ in front ADL's.    Baseline 04/08/22: 47 deg flex, 55 deg abduction; 05/20/22 flex: 52d abd: 45d  06/06/22 flex: 52d abd: 45d    Time 8    Period Weeks    Status Not Met    Target Date 06/03/22      PT LONG TERM GOAL #3   Title Pt will improve L elbow AROM in flexion and extension by at least 5 deg/direction to assist in LUE ADL's such as dressing/feeding    Baseline 04/08/22: 25-90 deg; 05/20/22 22-112d 06/06/22 16-113d   Time 8    Period Weeks    Status Partially Met      PT LONG TERM  GOAL #4   Title Pt will report < 6/10 NPS as worst pain in L shoulder and elbow with LUE tasks to demonstrate clinically significant reduction in pain levels with ADL's.    Baseline 04/08/22: Up to 8/10 NPS; 05/20/22 8/10 ; 06/06/22 7/10   Time 8    Period Weeks    Status On-going                 ChDurwin RegesPT ChDurwin Reges  PT 06/13/2022, 9:45 PM

## 2022-06-15 ENCOUNTER — Ambulatory Visit: Payer: Medicare Other | Attending: Student | Admitting: Physical Therapy

## 2022-06-15 ENCOUNTER — Encounter: Payer: Self-pay | Admitting: Physical Therapy

## 2022-06-15 DIAGNOSIS — G8929 Other chronic pain: Secondary | ICD-10-CM | POA: Diagnosis present

## 2022-06-15 DIAGNOSIS — M25522 Pain in left elbow: Secondary | ICD-10-CM | POA: Diagnosis present

## 2022-06-15 DIAGNOSIS — R293 Abnormal posture: Secondary | ICD-10-CM | POA: Insufficient documentation

## 2022-06-15 DIAGNOSIS — M25512 Pain in left shoulder: Secondary | ICD-10-CM | POA: Insufficient documentation

## 2022-06-15 NOTE — Therapy (Signed)
OUTPATIENT PHYSICAL THERAPY TREATMENT NOTE/PROGRESS  NOTE REPORTING PERIOD 04/08/22 - 06/07/22    Patient Name: Shannon Donovan MRN: [id number] DOB:[birth year], 71 y.o., female Today's Date: 06/15/2022  PCP: Shannon Hippo PA REFERRING PROVIDER: Damaris Hippo PA   PT End of Session - 06/15/22 1047     Visit Number 18    Number of Visits 26    Date for PT Re-Evaluation 08/12/22    Authorization Type Medicare (traditional)    Authorization Time Period 04/08/22-06/03/22    Authorization - Visit Number 8    Progress Note Due on Visit 10    PT Start Time 1046    PT Stop Time 1130    PT Time Calculation (min) 44 min    Activity Tolerance Patient tolerated treatment well    Behavior During Therapy WFL for tasks assessed/performed                 Past Medical History:  Diagnosis Date   Adolescent scoliosis    Anxiety    Atrial fibrillation ([place])    Dysrhythmia    A fib   History of chicken pox    Hyperlipidemia    Hyperplastic colon polyp 09/05/2016   Tubular adenoma of colon   Hypertension    benign/ controlled   Short-term memory loss    Shoulder tendinitis    LEFT upper biceps tendonitis, rotator cuff tendinitis   Stroke ([place]) 12/7251   embolic CVA/ short term memory loss   Past Surgical History:  Procedure Laterality Date   BACK SURGERY     x2 at age 65 and 74 for scoliosis   COLONOSCOPY N/A 09/05/2016   Procedure: COLONOSCOPY;  Surgeon: Lollie Sails, MD;  Location: [facility] ENDOSCOPY;  Service: Endoscopy;  Laterality: N/A;  Pt on Coumadin (PT/ INR)   SHOULDER ARTHROSCOPY WITH OPEN ROTATOR CUFF REPAIR Left 02/08/2018   Procedure: SHOULDER ARTHROSCOPY WITH OPEN ROTATOR CUFF REPAIR;  Surgeon: Corky Mull, MD;  Location: [facility] ORS;  Service: Orthopedics;  Laterality: Left;   SHOULDER CLOSED REDUCTION Left 05/09/2018   Procedure: CLOSED MANIPULATION SHOULDER  UNDER ANESTHESIA WITH STEROID INJECTION;  Surgeon: Corky Mull, MD;  Location: [place];   Service: Orthopedics;  Laterality: Left;   TONSILLECTOMY     Patient Active Problem List   Diagnosis Date Noted   Left rotator cuff tear 02/08/2018    REFERRING DIAG: chronic left shoulder pain; pain in the elbow  THERAPY DIAG:  Abnormal posture  Rationale for Evaluation and Treatment Rehabilitation  PERTINENT HISTORY: Shannon Donovan is a 45yoF who comes to [facility] for recurrent pain at LLE elbow and shoulder. Pt followed by orthopedics for this issue ongoing. Pt has history of Left shoulder RC repair, frozen shoulder, manipulation under anesthesia, and multiple injections.  PRECAUTIONS: fall  SUBJECTIVE: Pt reports continued L elbow pain, minimal shoulder pain. Is attempting to use her LUE more at home as able.   PAIN:  Are you having pain? Yes 8/10 in L elbow    TODAY'S TREATMENT:   Ther-Ex Nustep seat 7 UE 11 21mns L3 for gentle AAROM of L shoulder elbow; cuing to use RUE to move L Supinated row RTB 2x 10 with cuing for elbow ext without forward trunk lean  Beng over row 2# DB with demo and max cuing initially for technique with good carry over Flexion/lat stretch in sitting with elbows on table x30sec (added to HEP) Bicep stretch at wall x20sec   Manual  STM with trigger point release to bicep muscle belly, pronator teres, and tricep group alt heat to area not being worked on from bicep and forearm; maintained elbow ext and supination to available range with overpressure into flex and ext throughout  (W/ heat applied to tricep group) Radial + ulnar PA mob G3 3x 30sec bout for with movement increased extensor mobility; humeral PA mob G3 3x 30sec bout for with movement increased flexion mobility; ulnar to radial glide grade 3with supination movement x81mn  PROM L shoulder all directions 271ms     Clinical Impression: PT continued manual techniques and therex progression for increased mobility and strength of LUE with success. Pt does demonstrate minimal shoulder  mobility with PROM which continues to be an improvement. Pt is motivated throughout session and able to comply with all cuing for proper technique of therex with success. PT will continue progression as able.    PATIENT EDUCATION: Education details: therex form/technique Person educated: Patient and CaAmbulance personExCustomer service managerducation comprehension: verbalized understanding and returned demonstration   HOME EXERCISE PROGRAM: ACVANKGR      PT Short Term Goals - 05/20/22 1134       PT SHORT TERM GOAL #1   Title Pt will be independent with HEP to improve pain/mobility of L elbow and shoulder for ADL completion    Baseline 04/08/22: Initiated ; 05/20/22 completing regularly without concern    Time 4    Period Weeks    Status Achieved              PT Long Term Goals - 05/20/22 1134       PT LONG TERM GOAL #1   Title Pt will increase FOTO to target score to demonstrate clinically significant improvement in functional mobility    Baseline 04/08/22: Deferred to next session; collected on 04/29/22: 30 with target of 56; 05/20/22 47 ; 06/06/22 36   Time 8    Period Weeks    Status On-going    Target Date 06/03/22      PT LONG TERM GOAL #2   Title Pt will improve L shoulder AROM in flexion  > 10 deg to demonstrate clinically significant improvement and ability to perform overhead/ in front ADL's.    Baseline 04/08/22: 47 deg flex, 55 deg abduction; 05/20/22 flex: 52d abd: 45d  06/06/22 flex: 52d abd: 45d    Time 8    Period Weeks    Status Not Met    Target Date 06/03/22      PT LONG TERM GOAL #3   Title Pt will improve L elbow AROM in flexion and extension by at least 5 deg/direction to assist in LUE ADL's such as dressing/feeding    Baseline 04/08/22: 25-90 deg; 05/20/22 22-112d 06/06/22 16-113d   Time 8    Period Weeks    Status Partially Met      PT LONG TERM GOAL #4   Title Pt will report < 6/10 NPS as worst pain in L shoulder and elbow with  LUE tasks to demonstrate clinically significant reduction in pain levels with ADL's.    Baseline 04/08/22: Up to 8/10 NPS; 05/20/22 8/10 ; 06/06/22 7/10   Time 8    Period Weeks    Status On-going                 ChDurwin RegesPT ChDurwin RegesPT 06/15/2022, 11:27 AM

## 2022-06-20 ENCOUNTER — Encounter: Payer: Self-pay | Admitting: Physical Therapy

## 2022-06-20 ENCOUNTER — Ambulatory Visit: Payer: Medicare Other | Admitting: Physical Therapy

## 2022-06-20 DIAGNOSIS — G8929 Other chronic pain: Secondary | ICD-10-CM

## 2022-06-20 DIAGNOSIS — R293 Abnormal posture: Secondary | ICD-10-CM | POA: Diagnosis not present

## 2022-06-20 DIAGNOSIS — M25522 Pain in left elbow: Secondary | ICD-10-CM

## 2022-06-20 NOTE — Therapy (Signed)
OUTPATIENT PHYSICAL THERAPY TREATMENT NOTE/PROGRESS  NOTE REPORTING PERIOD 04/08/22 - 06/07/22    Patient Name: Shannon Donovan MRN: [id number] DOB:[birth year], 71 y.o., female Today's Date: 06/20/2022  PCP: Damaris Hippo PA REFERRING PROVIDER: Damaris Hippo PA   PT End of Session - 06/20/22 1008     Visit Number 19    Number of Visits 26    Date for PT Re-Evaluation 08/12/22    Authorization Type Medicare (traditional)    Authorization Time Period 04/08/22-06/03/22    Authorization - Visit Number 9    Progress Note Due on Visit 10    PT Start Time 1004    PT Stop Time 1045    PT Time Calculation (min) 41 min    Activity Tolerance Patient tolerated treatment well    Behavior During Therapy WFL for tasks assessed/performed                  Past Medical History:  Diagnosis Date   Adolescent scoliosis    Anxiety    Atrial fibrillation ([place])    Dysrhythmia    A fib   History of chicken pox    Hyperlipidemia    Hyperplastic colon polyp 09/05/2016   Tubular adenoma of colon   Hypertension    benign/ controlled   Short-term memory loss    Shoulder tendinitis    LEFT upper biceps tendonitis, rotator cuff tendinitis   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   Past Surgical History:  Procedure Laterality Date   BACK SURGERY     x2 at age 2 and 30 for scoliosis   COLONOSCOPY N/A 09/05/2016   Procedure: COLONOSCOPY;  Surgeon: Lollie Sails, MD;  Location: [facility] ENDOSCOPY;  Service: Endoscopy;  Laterality: N/A;  Pt on Coumadin (PT/ INR)   SHOULDER ARTHROSCOPY WITH OPEN ROTATOR CUFF REPAIR Left 02/08/2018   Procedure: SHOULDER ARTHROSCOPY WITH OPEN ROTATOR CUFF REPAIR;  Surgeon: Corky Mull, MD;  Location: [facility] ORS;  Service: Orthopedics;  Laterality: Left;   SHOULDER CLOSED REDUCTION Left 05/09/2018   Procedure: CLOSED MANIPULATION SHOULDER  UNDER ANESTHESIA WITH STEROID INJECTION;  Surgeon: Corky Mull, MD;  Location: [place];   Service: Orthopedics;  Laterality: Left;   TONSILLECTOMY     Patient Active Problem List   Diagnosis Date Noted   Left rotator cuff tear 02/08/2018    REFERRING DIAG: chronic left shoulder pain; pain in the elbow  THERAPY DIAG:  Abnormal posture  Chronic left shoulder pain  Pain in left elbow  Rationale for Evaluation and Treatment Rehabilitation  PERTINENT HISTORY: Shannon Donovan is a 47yoF who comes to [facility] for recurrent pain at LLE elbow and shoulder. Pt followed by orthopedics for this issue ongoing. Pt has history of Left shoulder RC repair, frozen shoulder, manipulation under anesthesia, and multiple injections.  PRECAUTIONS: fall  SUBJECTIVE: Pt reports continued L elbow pain, minimal shoulder pain. Is attempting to use her LUE more at home as able.   PAIN:  Are you having pain? Yes 8/10 in L elbow    TODAY'S TREATMENT:   Ther-Ex Nustep seat 7 UE 11 33mns L3 for gentle AAROM of L shoulder elbow; cuing to use RUE to move L Supine dowel AAROM flex  Supinated row RTB 2x 12 with cuing for elbow ext without forward trunk lean  Bent over row bilat of 9# box with max cuing initially to prevent thoracic ext with good carry over Flexion/lat stretch in sitting with elbows on table  x30sec (added to HEP) Bicep stretch at wall x20sec   Manual         STM with trigger point release to bicep muscle belly, pronator teres, and tricep group alt heat to area not being worked on from bicep and forearm; maintained elbow ext and supination to available range with overpressure into flex and ext throughout  (W/ heat applied to tricep group) Radial + ulnar PA mob G3 3x 30sec bout for with movement increased extensor mobility; humeral PA mob G3 3x 30sec bout for with movement increased flexion mobility; ulnar to radial glide grade 3with supination movement x5mn  PROM L shoulder all directions 236ms     Clinical Impression: PT continued manual techniques and therex progression for  increased mobility and strength of LUE with success. Pt does demonstrate minimal shoulder mobility with PROM which continues to be an improvement. Pt is able to comply with all cuing for proper technique of therex, with good motivation throughout session. Per medicare compliance PT will re-assess progress next visit PT will continue progression as able.    PATIENT EDUCATION: Education details: therex form/technique Person educated: Patient and CaAmbulance personExCustomer service managerducation comprehension: verbalized understanding and returned demonstration   HOME EXERCISE PROGRAM: ACVANKGR      PT Short Term Goals - 05/20/22 1134       PT SHORT TERM GOAL #1   Title Pt will be independent with HEP to improve pain/mobility of L elbow and shoulder for ADL completion    Baseline 04/08/22: Initiated ; 05/20/22 completing regularly without concern    Time 4    Period Weeks    Status Achieved              PT Long Term Goals - 05/20/22 1134       PT LONG TERM GOAL #1   Title Pt will increase FOTO to target score to demonstrate clinically significant improvement in functional mobility    Baseline 04/08/22: Deferred to next session; collected on 04/29/22: 30 with target of 56; 05/20/22 47 ; 06/06/22 36   Time 8    Period Weeks    Status On-going    Target Date 06/03/22      PT LONG TERM GOAL #2   Title Pt will improve L shoulder AROM in flexion  > 10 deg to demonstrate clinically significant improvement and ability to perform overhead/ in front ADL's.    Baseline 04/08/22: 47 deg flex, 55 deg abduction; 05/20/22 flex: 52d abd: 45d  06/06/22 flex: 52d abd: 45d    Time 8    Period Weeks    Status Not Met    Target Date 06/03/22      PT LONG TERM GOAL #3   Title Pt will improve L elbow AROM in flexion and extension by at least 5 deg/direction to assist in LUE ADL's such as dressing/feeding    Baseline 04/08/22: 25-90 deg; 05/20/22 22-112d 06/06/22 16-113d   Time 8     Period Weeks    Status Partially Met      PT LONG TERM GOAL #4   Title Pt will report < 6/10 NPS as worst pain in L shoulder and elbow with LUE tasks to demonstrate clinically significant reduction in pain levels with ADL's.    Baseline 04/08/22: Up to 8/10 NPS; 05/20/22 8/10 ; 06/06/22 7/10   Time 8    Period Weeks    Status On-going  Durwin Reges DPT Durwin Reges, PT 06/20/2022, 10:43 AM

## 2022-06-22 ENCOUNTER — Ambulatory Visit: Payer: Medicare Other | Admitting: Physical Therapy

## 2022-06-27 ENCOUNTER — Ambulatory Visit: Payer: Medicare Other | Admitting: Physical Therapy

## 2022-06-27 ENCOUNTER — Encounter: Payer: Self-pay | Admitting: Physical Therapy

## 2022-06-27 DIAGNOSIS — G8929 Other chronic pain: Secondary | ICD-10-CM

## 2022-06-27 DIAGNOSIS — R293 Abnormal posture: Secondary | ICD-10-CM

## 2022-06-27 NOTE — Therapy (Signed)
OUTPATIENT PHYSICAL THERAPY TREATMENT NOTE/PROGRESS  NOTE REPORTING PERIOD 06/07/22- 06/28/22    Patient Name: Shannon Donovan MRN: [id number] DOB:[birth year], 71 y.o., female Today's Date: 06/28/2022  PCP: Damaris Hippo PA REFERRING PROVIDER: Damaris Hippo PA   PT End of Session - 06/27/22 1053     Visit Number 20    Number of Visits 26    Date for PT Re-Evaluation 08/12/22    Authorization Type Medicare (traditional)    Authorization Time Period 04/08/22-06/03/22    Authorization - Visit Number 10    Progress Note Due on Visit 10    PT Start Time 1050    PT Stop Time 1130    PT Time Calculation (min) 40 min    Activity Tolerance Patient tolerated treatment well    Behavior During Therapy WFL for tasks assessed/performed                   Past Medical History:  Diagnosis Date   Adolescent scoliosis    Anxiety    Atrial fibrillation ([place])    Dysrhythmia    A fib   History of chicken pox    Hyperlipidemia    Hyperplastic colon polyp 09/05/2016   Tubular adenoma of colon   Hypertension    benign/ controlled   Short-term memory loss    Shoulder tendinitis    LEFT upper biceps tendonitis, rotator cuff tendinitis   Stroke ([place]) 56/8127   embolic CVA/ short term memory loss   Past Surgical History:  Procedure Laterality Date   BACK SURGERY     x2 at age 84 and 26 for scoliosis   COLONOSCOPY N/A 09/05/2016   Procedure: COLONOSCOPY;  Surgeon: Lollie Sails, MD;  Location: [facility] ENDOSCOPY;  Service: Endoscopy;  Laterality: N/A;  Pt on Coumadin (PT/ INR)   SHOULDER ARTHROSCOPY WITH OPEN ROTATOR CUFF REPAIR Left 02/08/2018   Procedure: SHOULDER ARTHROSCOPY WITH OPEN ROTATOR CUFF REPAIR;  Surgeon: Corky Mull, MD;  Location: [facility] ORS;  Service: Orthopedics;  Laterality: Left;   SHOULDER CLOSED REDUCTION Left 05/09/2018   Procedure: CLOSED MANIPULATION SHOULDER  UNDER ANESTHESIA WITH STEROID INJECTION;  Surgeon: Corky Mull, MD;  Location: [place];   Service: Orthopedics;  Laterality: Left;   TONSILLECTOMY     Patient Active Problem List   Diagnosis Date Noted   Left rotator cuff tear 02/08/2018    REFERRING DIAG: chronic left shoulder pain; pain in the elbow  THERAPY DIAG:  Abnormal posture  Chronic left shoulder pain  Rationale for Evaluation and Treatment Rehabilitation  PERTINENT HISTORY: Shannon Donovan is a 45yoF who comes to [facility] for recurrent pain at LLE elbow and shoulder. Pt followed by orthopedics for this issue ongoing. Pt has history of Left shoulder RC repair, frozen shoulder, manipulation under anesthesia, and multiple injections.  PRECAUTIONS: fall  SUBJECTIVE: Pt reports continued L elbow pain, minimal shoulder pain. Is attempting to use her LUE more at home as able.   PAIN:  Are you having pain? Yes 8/10 in L elbow    TODAY'S TREATMENT:   Ther-Ex Nustep seat 7 UE 11 22mns L3 for gentle AAROM of L shoulder elbow; cuing to use RUE to move L  PT reviewed the following HEP with patient with patient able to demonstrate a set of the following with min cuing for correction needed. PT educated patient on parameters of therex (how/when to inc/decrease intensity, frequency, rep/set range, stretch hold time, and purpose of therex) with verbalized understanding.  Access Code: [email or web address] -  Supine Shoulder Flexion Extension AAROM with Dowel  - 5-7 x weekly - 12-20 reps - Supine Shoulder Press AAROM in Abduction with Dowel  - 5-7 x weekly - 12-20 reps - Standing Bicep Stretch at Wall  - 1-2 x daily - 7 x weekly - 30sec hold - Standing Bicep Curls with Resistance Band with PLB  - 1 x daily - 2 x weekly - 3 sets - 10 reps - Seated Shoulder Row with Resistance Anchored at Feet  - 1 x daily - 2 x weekly - 3 sets - 10 reps     Clinical Impression: PT reviewed goals with patient where she has made some progress, but is greatly plateaued since last progress note. Patient with continued shoulder mobility limitations, being the  most impactful on function, and causing guarding in the elbow. Shoulder limitations seem due to adhesive capsulitis, which patient has been educatied on the time dependent nature of. Pt given an HEP to contintue working toward progress at home which she and caregiver are able to verbalize and demonstrate understanding of. Pt to d/c formal PT services.    PATIENT EDUCATION: Education details: therex form/technique Person educated: Patient and Ambulance person: Customer service manager Education comprehension: verbalized understanding and returned demonstration   HOME EXERCISE PROGRAM: [email or web address]      PT Short Term Goals - 05/20/22 1134       PT SHORT TERM GOAL #1   Title Pt will be independent with HEP to improve pain/mobility of L elbow and shoulder for ADL completion    Baseline 04/08/22: Initiated ; 05/20/22 completing regularly without concern    Time 4    Period Weeks    Status Achieved              PT Long Term Goals - 05/20/22 1134       PT LONG TERM GOAL #1   Title Pt will increase FOTO to target score to demonstrate clinically significant improvement in functional mobility    Baseline 04/08/22: Deferred to next session; collected on 04/29/22: 30 with target of 56; 05/20/22 47 ; 06/06/22 36   Time 8    Period Weeks    Status On-going    Target Date 06/03/22      PT LONG TERM GOAL #2   Title Pt will improve L shoulder AROM in flexion  > 10 deg to demonstrate clinically significant improvement and ability to perform overhead/ in front ADL's.    Baseline 04/08/22: 47 deg flex, 55 deg abduction; 05/20/22 flex: 52d abd: 45d  06/06/22 flex: 52d abd: 45d    Time 8    Period Weeks    Status Not Met    Target Date 06/03/22      PT LONG TERM GOAL #3   Title Pt will improve L elbow AROM in flexion and extension by at least 5 deg/direction to assist in LUE ADL's such as dressing/feeding    Baseline 04/08/22: 25-90 deg; 05/20/22 22-112d 06/06/22 16-113d   Time 8     Period Weeks    Status Partially Met      PT LONG TERM GOAL #4   Title Pt will report < 6/10 NPS as worst pain in L shoulder and elbow with LUE tasks to demonstrate clinically significant reduction in pain levels with ADL's.    Baseline 04/08/22: Up to 8/10 NPS; 05/20/22 8/10 ; 06/06/22 7/10   Time 8    Period Weeks    Status On-going  Durwin Reges DPT Durwin Reges, PT 06/28/2022, 1:54 PM

## 2022-06-29 ENCOUNTER — Encounter: Payer: Medicare Other | Admitting: Physical Therapy

## 2022-07-01 ENCOUNTER — Ambulatory Visit: Payer: Medicare Other | Admitting: Physical Therapy

## 2022-07-04 ENCOUNTER — Ambulatory Visit: Payer: Medicare Other | Admitting: Physical Therapy

## 2022-07-06 ENCOUNTER — Encounter: Payer: Medicare Other | Admitting: Physical Therapy

## 2022-07-11 ENCOUNTER — Encounter: Payer: Medicare Other | Admitting: Physical Therapy

## 2022-07-13 ENCOUNTER — Encounter: Payer: Medicare Other | Admitting: Physical Therapy

## 2022-12-13 ENCOUNTER — Ambulatory Visit: Payer: Medicare Other | Admitting: Certified Registered Nurse Anesthetist

## 2022-12-13 ENCOUNTER — Ambulatory Visit
Admission: RE | Admit: 2022-12-13 | Discharge: 2022-12-13 | Disposition: A | Discharge status: Home / Self Care | Payer: Medicare Other | Attending: Gastroenterology | Admitting: Gastroenterology

## 2022-12-13 ENCOUNTER — Encounter
Admission: RE | Disposition: A | Discharge status: Home / Self Care | Payer: Self-pay | Source: Home / Self Care | Attending: Gastroenterology

## 2022-12-13 ENCOUNTER — Encounter: Payer: Self-pay | Admitting: *Deleted

## 2022-12-13 DIAGNOSIS — D122 Benign neoplasm of ascending colon: Secondary | ICD-10-CM | POA: Insufficient documentation

## 2022-12-13 DIAGNOSIS — D124 Benign neoplasm of descending colon: Secondary | ICD-10-CM | POA: Diagnosis not present

## 2022-12-13 DIAGNOSIS — Z8673 Personal history of transient ischemic attack (TIA), and cerebral infarction without residual deficits: Secondary | ICD-10-CM | POA: Insufficient documentation

## 2022-12-13 DIAGNOSIS — K5730 Diverticulosis of large intestine without perforation or abscess without bleeding: Secondary | ICD-10-CM | POA: Insufficient documentation

## 2022-12-13 DIAGNOSIS — D123 Benign neoplasm of transverse colon: Secondary | ICD-10-CM | POA: Insufficient documentation

## 2022-12-13 DIAGNOSIS — E785 Hyperlipidemia, unspecified: Secondary | ICD-10-CM | POA: Insufficient documentation

## 2022-12-13 DIAGNOSIS — Z86010 Personal history of colonic polyps: Secondary | ICD-10-CM | POA: Insufficient documentation

## 2022-12-13 DIAGNOSIS — Z1211 Encounter for screening for malignant neoplasm of colon: Secondary | ICD-10-CM | POA: Diagnosis not present

## 2022-12-13 DIAGNOSIS — I10 Essential (primary) hypertension: Secondary | ICD-10-CM | POA: Insufficient documentation

## 2022-12-13 DIAGNOSIS — Z800 Family history of malignant neoplasm of digestive organs: Secondary | ICD-10-CM | POA: Diagnosis not present

## 2022-12-13 DIAGNOSIS — Z7901 Long term (current) use of anticoagulants: Secondary | ICD-10-CM | POA: Diagnosis not present

## 2022-12-13 DIAGNOSIS — I4891 Unspecified atrial fibrillation: Secondary | ICD-10-CM | POA: Insufficient documentation

## 2022-12-13 HISTORY — PX: COLONOSCOPY WITH PROPOFOL: SHX5780

## 2022-12-13 SURGERY — COLONOSCOPY WITH PROPOFOL
Anesthesia: General

## 2022-12-13 MED ORDER — PROPOFOL 10 MG/ML IV BOLUS
INTRAVENOUS | Status: DC | PRN
  Administered 2022-12-13: 50 mg via INTRAVENOUS
  Administered 2022-12-13: 20 mg via INTRAVENOUS
  Administered 2022-12-13: 10 mg via INTRAVENOUS

## 2022-12-13 MED ORDER — MIDAZOLAM HCL 2 MG/2ML IJ SOLN
INTRAMUSCULAR | Status: AC
  Filled 2022-12-13: qty 2

## 2022-12-13 MED ORDER — SODIUM CHLORIDE 0.9 % IV SOLN
INTRAVENOUS | Status: DC
  Administered 2022-12-13: 1000 mL via INTRAVENOUS

## 2022-12-13 MED ORDER — PROPOFOL 500 MG/50ML IV EMUL
INTRAVENOUS | Status: DC | PRN
  Administered 2022-12-13: 120 ug/kg/min via INTRAVENOUS

## 2022-12-13 MED ORDER — DEXMEDETOMIDINE HCL IN NACL 80 MCG/20ML IV SOLN
INTRAVENOUS | Status: DC | PRN
  Administered 2022-12-13: 8 ug via BUCCAL

## 2022-12-13 NOTE — Transfer of Care (Signed)
Immediate Anesthesia Transfer of Care Note  Patient: Shannon Donovan  Procedure(s) Performed: COLONOSCOPY WITH PROPOFOL  Patient Location: PACU  Anesthesia Type:General  Level of Consciousness: drowsy  Airway & Oxygen Therapy: Pt spontaneously breathing  Post-op Assessment: Report given to RN and Post -op Vital signs reviewed and stable  Post vital signs: Reviewed and stable  Last Vitals:  Vitals Value Taken Time  BP 153/77 12/13/22 1440  Temp 35.9 C 12/13/22 1440  Pulse 80 12/13/22 1442  Resp 23 12/13/22 1442  SpO2 99 % 12/13/22 1442  Vitals shown include unvalidated device data.  Last Pain:  Vitals:   12/13/22 1440  TempSrc: Temporal  PainSc: Asleep         Complications: No notable events documented.

## 2022-12-13 NOTE — H&P (Signed)
Outpatient short stay form Pre-procedure 12/13/2022  Lesly Rubenstein, MD  Primary Physician: Lattie Corns, PA-C  Reason for visit:  Surveillance colonoscopy  History of present illness:    72 y/o lady with history of CVA and residual memory loss on eliquis with last dose 3 days ago here for surveillance colonoscopy. Last colonoscopy in 2017 with small adenomas. Father with colon cancer at unknown age. No significant abdominal surgeries.    Current Facility-Administered Medications:    0.9 %  sodium chloride infusion, , Intravenous, Continuous, Reymundo Winship, Hilton Cork, MD, Last Rate: 20 mL/hr at 12/13/22 1344, 1,000 mL at 12/13/22 1344  Medications Prior to Admission  Medication Sig Dispense Refill Last Dose   amLODipine-olmesartan (AZOR) 5-40 MG tablet Take 1 tablet by mouth every evening.    12/12/2022   Calcium Carb-Cholecalciferol (CALCIUM 500 +D) 500-400 MG-UNIT TABS Take 1 tablet by mouth every evening.   12/12/2022   Cholecalciferol (D3-1000 PO) Take 1,000 Units by mouth every evening.   12/12/2022   Cyanocobalamin (VITAMIN B-12) 500 MCG SUBL Place 500 mcg under the tongue every evening.   12/12/2022   Liniments (BEN GAY EX) Apply 1 application topically 3 (three) times daily as needed (for shoulder pain/muscle aches).   12/12/2022   Potassium Gluconate 550 (90 K) MG TABS Take 550 mg by mouth every evening.   12/12/2022   simvastatin (ZOCOR) 10 MG tablet Take 10 mg by mouth every evening.    12/12/2022   traMADol (ULTRAM) 50 MG tablet Take 1 tablet (50 mg total) by mouth every 6 (six) hours as needed. 40 tablet 0 12/12/2022   acetaminophen (TYLENOL) 500 MG tablet Take 1,000 mg by mouth every 6 (six) hours as needed (for pain/headaches.).    at prn   warfarin (COUMADIN) 2.5 MG tablet Take 2.5 mg by mouth daily. (Patient not taking: Reported on 12/13/2022)   Not Taking   warfarin (COUMADIN) 5 MG tablet Take 2.5-5 mg by mouth every evening. Take 1 tablet (5 mg) by mouth on Saturdays in  the evening, and take 0.5 tablet (2.5 mg) by mouth on all other day of the week in the evening. (Patient not taking: Reported on 12/13/2022)   Not Taking     Allergies  Allergen Reactions   Penicillins Other (See Comments)    Has patient had a PCN reaction causing immediate rash, facial/tongue/throat swelling, SOB or lightheadedness with hypotension: Unknown Has patient had a PCN reaction causing severe rash involving mucus membranes or skin necrosis: Unknown Has patient had a PCN reaction that required hospitalization: No Has patient had a PCN reaction occurring within the last 10 years: No Childhood reaction If all of the above answers are "NO", then may proceed with Cephalosporin use.    Latex Rash     Past Medical History:  Diagnosis Date   Adolescent scoliosis    Anxiety    Atrial fibrillation ([place])    Dysrhythmia    A fib   History of chicken pox    Hyperlipidemia    Hyperplastic colon polyp 09/05/2016   Tubular adenoma of colon   Hypertension    benign/ controlled   Short-term memory loss    Shoulder tendinitis    LEFT upper biceps tendonitis, rotator cuff tendinitis   Stroke ([place]) 90/3009   embolic CVA/ short term memory loss    Review of systems:  Otherwise negative.    Physical Exam  Gen: Alert, oriented. Appears stated age.  HEENT: PERRLA. Lungs: No respiratory distress CV:  RRR Abd: soft, benign, no masses Ext: No edema    Planned procedures: Proceed with colonoscopy. The patient understands the nature of the planned procedure, indications, risks, alternatives and potential complications including but not limited to bleeding, infection, perforation, damage to internal organs and possible oversedation/side effects from anesthesia. The patient agrees and gives consent to proceed.  Please refer to procedure notes for findings, recommendations and patient disposition/instructions.     Lesly Rubenstein, MD [facility]

## 2022-12-13 NOTE — Interval H&P Note (Signed)
History and Physical Interval Note:  12/13/2022 1:54 PM  Shannon Donovan  has presented today for surgery, with the diagnosis of HX TA Polyps.  The various methods of treatment have been discussed with the patient and family. After consideration of risks, benefits and other options for treatment, the patient has consented to  Procedure(s) with comments: COLONOSCOPY WITH PROPOFOL (N/A) - 12:15 PM  -DRIVER PREFERS PM PROCEDURE - DARIANE NATZKE -  POA [phone] as a surgical intervention.  The patient's history has been reviewed, patient examined, no change in status, stable for surgery.  I have reviewed the patient's chart and labs.  Questions were answered to the patient's satisfaction.     Lesly Rubenstein  Ok to proceed with colonoscopy

## 2022-12-13 NOTE — Anesthesia Procedure Notes (Signed)
Date/Time: 12/13/2022 2:03 PM  Performed by: Demetrius Charity, CRNAPre-anesthesia Checklist: Patient identified, Emergency Drugs available, Suction available, Patient being monitored and Timeout performed Patient Re-evaluated:Patient Re-evaluated prior to induction Oxygen Delivery Method: Nasal cannula Induction Type: IV induction Placement Confirmation: CO2 detector and positive ETCO2

## 2022-12-13 NOTE — Anesthesia Postprocedure Evaluation (Signed)
Anesthesia Post Note  Patient: Shannon Donovan  Procedure(s) Performed: COLONOSCOPY WITH PROPOFOL  Patient location during evaluation: Endoscopy Anesthesia Type: General Level of consciousness: awake and alert Pain management: pain level controlled Vital Signs Assessment: post-procedure vital signs reviewed and stable Respiratory status: spontaneous breathing, nonlabored ventilation, respiratory function stable and patient connected to nasal cannula oxygen Cardiovascular status: blood pressure returned to baseline and stable Postop Assessment: no apparent nausea or vomiting Anesthetic complications: no   No notable events documented.   Last Vitals:  Vitals:   12/13/22 1450 12/13/22 1500  BP: 123/72 130/87  Pulse: 76 77  Resp: (!) 21 (!) 21  Temp:    SpO2: 100% 100%    Last Pain:  Vitals:   12/13/22 1500  TempSrc:   PainSc: 0-No pain                 Arita Miss

## 2022-12-13 NOTE — Anesthesia Preprocedure Evaluation (Signed)
Anesthesia Evaluation  Patient identified by MRN, date of birth, ID band Patient awake    Reviewed: Allergy & Precautions, NPO status , Patient's Chart, lab work & pertinent test results  History of Anesthesia Complications Negative for: history of anesthetic complications  Airway Mallampati: III  TM Distance: >3 FB     Dental  (+) Teeth Intact   Pulmonary neg pulmonary ROS, neg sleep apnea, neg COPD, neg recent URI, Patient abstained from smoking.Not current smoker   breath sounds clear to auscultation       Cardiovascular Exercise Tolerance: Good METShypertension, (-) CAD and (-) Past MI + dysrhythmias (atrial fibrillation) Atrial Fibrillation  Rhythm:Regular Rate:Normal  HLD   Neuro/Psych   Anxiety     CVA (6712 - embolic, short term memory loss), No Residual Symptoms  negative psych ROS   GI/Hepatic negative GI ROS,neg GERD  ,,(+)     (-) substance abuse    Endo/Other  negative endocrine ROSneg diabetes    Renal/GU negative Renal ROS     Musculoskeletal   Abdominal   Peds  Hematology negative hematology ROS (+)   Anesthesia Other Findings Past Medical History: No date: Adolescent scoliosis No date: Anxiety No date: Atrial fibrillation ([facility]) No date: Dysrhythmia     Comment:  A fib No date: History of chicken pox No date: Hyperlipidemia 09/05/2016: Hyperplastic colon polyp     Comment:  Tubular adenoma of colon No date: Hypertension     Comment:  benign/ controlled No date: Short-term memory loss No date: Shoulder tendinitis     Comment:  LEFT upper biceps tendonitis, rotator cuff tendinitis 05/2010: Stroke ([place])     Comment:  embolic CVA/ short term memory loss  Reproductive/Obstetrics                              Anesthesia Physical Anesthesia Plan  ASA: 3  Anesthesia Plan: General   Post-op Pain Management: Minimal or no pain anticipated   Induction:  Intravenous  PONV Risk Score and Plan: 2 and Propofol infusion, TIVA and Ondansetron  Airway Management Planned: Nasal Cannula  Additional Equipment: None  Intra-op Plan:   Post-operative Plan:   Informed Consent: I have reviewed the patients History and Physical, chart, labs and discussed the procedure including the risks, benefits and alternatives for the proposed anesthesia with the patient or authorized representative who has indicated his/her understanding and acceptance.     Dental advisory given  Plan Discussed with: CRNA and Surgeon  Anesthesia Plan Comments: (Discussed risks of anesthesia with patient, including possibility of difficulty with spontaneous ventilation under anesthesia necessitating airway intervention, PONV, and rare risks such as cardiac or respiratory or neurological events, and allergic reactions. Discussed the role of CRNA in patient's perioperative care. Patient understands.)         Anesthesia Quick Evaluation

## 2022-12-13 NOTE — Op Note (Signed)
[facility] Patient Name: Shannon Donovan Procedure Date: 12/13/2022 1:48 PM MRN: [id number] Account #: [id number] Date of Birth: 1950-11-27 Admit Type: Outpatient Age: 72 Room: [facility] ENDO ROOM 1 Gender: Female Note Status: Finalized Instrument Name: Jasper Riling [id number] Procedure:             Colonoscopy Indications:           Surveillance: Personal history of adenomatous polyps                         on last colonoscopy > 5 years ago Providers:             Andrey Farmer MD, MD Referring MD:          Moreen Fowler (Referring MD) Medicines:             Monitored Anesthesia Care Complications:         No immediate complications. Estimated blood loss:                         Minimal. Procedure:             Pre-Anesthesia Assessment:                        - Prior to the procedure, a History and Physical was                         performed, and patient medications and allergies were                         reviewed. The patient is competent. The risks and                         benefits of the procedure and the sedation options and                         risks were discussed with the patient. All questions                         were answered and informed consent was obtained.                         Patient identification and proposed procedure were                         verified by the physician, the nurse, the                         anesthesiologist, the anesthetist and the technician                         in the endoscopy suite. Mental Status Examination:                         alert and oriented. Airway Examination: normal                         oropharyngeal airway and neck mobility. Respiratory  Examination: clear to auscultation. CV Examination:                         normal. Prophylactic Antibiotics: The patient does not                         require prophylactic antibiotics. Prior                          Anticoagulants: The patient has taken no anticoagulant                         or antiplatelet agents. ASA Grade Assessment: III - A                         patient with severe systemic disease. After reviewing                         the risks and benefits, the patient was deemed in                         satisfactory condition to undergo the procedure. The                         anesthesia plan was to use monitored anesthesia care                         (MAC). Immediately prior to administration of                         medications, the patient was re-assessed for adequacy                         to receive sedatives. The heart rate, respiratory                         rate, oxygen saturations, blood pressure, adequacy of                         pulmonary ventilation, and response to care were                         monitored throughout the procedure. The physical                         status of the patient was re-assessed after the                         procedure.                        After obtaining informed consent, the colonoscope was                         passed under direct vision. Throughout the procedure,                         the patient's blood pressure, pulse, and oxygen  saturations were monitored continuously. The                         Colonoscope was introduced through the anus and                         advanced to the the cecum, identified by appendiceal                         orifice and ileocecal valve. The colonoscopy was                         technically difficult and complex due to significant                         looping. Successful completion of the procedure was                         aided by applying abdominal pressure. The patient                         tolerated the procedure well. The quality of the bowel                         preparation was adequate to identify polyps. The                          ileocecal valve, appendiceal orifice, and rectum were                         photographed. Findings:      The perianal and digital rectal examinations were normal.      A 3 mm polyp was found in the ascending colon. The polyp was sessile.       The polyp was removed with a cold snare. Resection and retrieval were       complete. Estimated blood loss was minimal.      A 4 mm polyp was found in the transverse colon. The polyp was sessile.       The polyp was removed with a cold snare. Resection and retrieval were       complete. Estimated blood loss was minimal.      A 3 mm polyp was found in the descending colon. The polyp was sessile.       The polyp was removed with a cold snare. Resection and retrieval were       complete. Estimated blood loss was minimal.      A few small-mouthed diverticula were found in the sigmoid colon.      The exam was otherwise without abnormality on direct and retroflexion       views. Impression:            - One 3 mm polyp in the ascending colon, removed with                         a cold snare. Resected and retrieved.                        - One 4 mm polyp in the transverse colon, removed with  a cold snare. Resected and retrieved.                        - One 3 mm polyp in the descending colon, removed with                         a cold snare. Resected and retrieved.                        - Diverticulosis in the sigmoid colon.                        - The examination was otherwise normal on direct and                         retroflexion views. Recommendation:        - Discharge patient to home.                        - Resume previous diet.                        - Continue present medications.                        - Await pathology results.                        - Repeat colonoscopy for surveillance based on                         pathology results.                        - Return to referring physician as previously                          scheduled. Procedure Code(s):     --- Professional ---                        [phone], Colonoscopy, flexible; with removal of                         tumor(s), polyp(s), or other lesion(s) by snare                         technique Diagnosis Code(s):     --- Professional ---                        Z86.010, Personal history of colonic polyps                        D12.2, Benign neoplasm of ascending colon                        D12.3, Benign neoplasm of transverse colon (hepatic                         flexure or splenic flexure)  D12.4, Benign neoplasm of descending colon                        K57.30, Diverticulosis of large intestine without                         perforation or abscess without bleeding CPT copyright 2022 American [employer]. All rights reserved. The codes documented in this report are preliminary and upon coder review may  be revised to meet current compliance requirements. Andrey Farmer MD, MD 12/13/2022 2:44:36 PM Number of Addenda: 0 Note Initiated On: 12/13/2022 1:48 PM Scope Withdrawal Time: 0 hours 8 minutes 56 seconds  Total Procedure Duration: 0 hours 25 minutes 9 seconds  Estimated Blood Loss:  Estimated blood loss was minimal.      [facility]

## 2022-12-14 ENCOUNTER — Encounter: Payer: Self-pay | Admitting: Gastroenterology

## 2022-12-15 LAB — SURGICAL PATHOLOGY

## 2023-02-22 ENCOUNTER — Other Ambulatory Visit: Payer: Self-pay | Admitting: Internal Medicine

## 2023-02-22 DIAGNOSIS — Z1231 Encounter for screening mammogram for malignant neoplasm of breast: Secondary | ICD-10-CM

## 2023-03-27 ENCOUNTER — Ambulatory Visit
Admission: RE | Admit: 2023-03-27 | Discharge: 2023-03-27 | Disposition: A | Discharge status: Home / Self Care | Payer: Medicare Other | Source: Ambulatory Visit | Attending: Internal Medicine | Admitting: Internal Medicine

## 2023-03-27 DIAGNOSIS — Z1231 Encounter for screening mammogram for malignant neoplasm of breast: Secondary | ICD-10-CM | POA: Diagnosis present
